# Patient Record
Sex: Female | Born: 1950
Health system: Southern US, Community
[De-identification: ages and names within clinical notes are randomized; demographics above are authoritative.]

## PROBLEM LIST (undated history)

## (undated) DIAGNOSIS — M1990 Unspecified osteoarthritis, unspecified site: Secondary | ICD-10-CM

## (undated) DIAGNOSIS — J40 Bronchitis, not specified as acute or chronic: Secondary | ICD-10-CM

## (undated) DIAGNOSIS — E785 Hyperlipidemia, unspecified: Secondary | ICD-10-CM

## (undated) DIAGNOSIS — E669 Obesity, unspecified: Secondary | ICD-10-CM

## (undated) DIAGNOSIS — M5126 Other intervertebral disc displacement, lumbar region: Secondary | ICD-10-CM

## (undated) DIAGNOSIS — M810 Age-related osteoporosis without current pathological fracture: Secondary | ICD-10-CM

## (undated) DIAGNOSIS — F329 Major depressive disorder, single episode, unspecified: Secondary | ICD-10-CM

## (undated) DIAGNOSIS — H269 Unspecified cataract: Secondary | ICD-10-CM

## (undated) DIAGNOSIS — E039 Hypothyroidism, unspecified: Secondary | ICD-10-CM

## (undated) DIAGNOSIS — K635 Polyp of colon: Secondary | ICD-10-CM

## (undated) DIAGNOSIS — J45909 Unspecified asthma, uncomplicated: Secondary | ICD-10-CM

## (undated) DIAGNOSIS — K5792 Diverticulitis of intestine, part unspecified, without perforation or abscess without bleeding: Secondary | ICD-10-CM

## (undated) DIAGNOSIS — F32A Depression, unspecified: Secondary | ICD-10-CM

## (undated) DIAGNOSIS — H409 Unspecified glaucoma: Secondary | ICD-10-CM

## (undated) DIAGNOSIS — K219 Gastro-esophageal reflux disease without esophagitis: Secondary | ICD-10-CM

## (undated) HISTORY — DX: Unspecified glaucoma: H40.9

## (undated) HISTORY — PX: BREAST SURGERY: SHX581

## (undated) HISTORY — PX: DILATION AND CURETTAGE OF UTERUS: SHX78

## (undated) HISTORY — PX: EYE SURGERY: SHX253

## (undated) HISTORY — DX: Polyp of colon: K63.5

## (undated) HISTORY — PX: OTHER SURGICAL HISTORY: SHX169

## (undated) HISTORY — DX: Hyperlipidemia, unspecified: E78.5

## (undated) HISTORY — DX: Diverticulitis of intestine, part unspecified, without perforation or abscess without bleeding: K57.92

## (undated) HISTORY — DX: Unspecified osteoarthritis, unspecified site: M19.90

## (undated) HISTORY — DX: Age-related osteoporosis without current pathological fracture: M81.0

## (undated) HISTORY — PX: COLONOSCOPY: SHX174

---

## 2005-12-01 ENCOUNTER — Ambulatory Visit: Payer: Self-pay | Admitting: Unknown Physician Specialty

## 2009-12-17 ENCOUNTER — Ambulatory Visit: Payer: Self-pay | Admitting: Unknown Physician Specialty

## 2011-06-17 ENCOUNTER — Ambulatory Visit: Payer: Self-pay | Admitting: Family Medicine

## 2013-02-21 ENCOUNTER — Emergency Department: Payer: Self-pay | Admitting: Emergency Medicine

## 2013-02-21 LAB — COMPREHENSIVE METABOLIC PANEL
Albumin: 3.6 g/dL (ref 3.4–5.0)
Alkaline Phosphatase: 90 U/L (ref 50–136)
Anion Gap: 4 — ABNORMAL LOW (ref 7–16)
BUN: 12 mg/dL (ref 7–18)
Bilirubin,Total: 0.7 mg/dL (ref 0.2–1.0)
Calcium, Total: 8.5 mg/dL (ref 8.5–10.1)
Chloride: 108 mmol/L — ABNORMAL HIGH (ref 98–107)
Co2: 27 mmol/L (ref 21–32)
EGFR (African American): >60
Glucose: 135 mg/dL — ABNORMAL HIGH (ref 65–99)
Osmolality: 279 (ref 275–301)
Potassium: 3.7 mmol/L (ref 3.5–5.1)
SGPT (ALT): 35 U/L (ref 12–78)
Sodium: 139 mmol/L (ref 136–145)

## 2013-02-21 LAB — CBC
HCT: 39.7 % (ref 35.0–47.0)
MCH: 29.4 pg (ref 26.0–34.0)
MCV: 88 fL (ref 80–100)

## 2013-02-21 LAB — TROPONIN I: Troponin-I: <0.02 ng/mL

## 2013-03-18 DIAGNOSIS — H35359 Cystoid macular degeneration, unspecified eye: Secondary | ICD-10-CM | POA: Insufficient documentation

## 2013-03-18 DIAGNOSIS — H35379 Puckering of macula, unspecified eye: Secondary | ICD-10-CM | POA: Insufficient documentation

## 2014-04-15 DIAGNOSIS — Z961 Presence of intraocular lens: Secondary | ICD-10-CM | POA: Insufficient documentation

## 2014-04-15 DIAGNOSIS — T8579XA Infection and inflammatory reaction due to other internal prosthetic devices, implants and grafts, initial encounter: Secondary | ICD-10-CM | POA: Insufficient documentation

## 2014-04-15 DIAGNOSIS — H40001 Preglaucoma, unspecified, right eye: Secondary | ICD-10-CM | POA: Insufficient documentation

## 2015-07-24 DIAGNOSIS — I8390 Asymptomatic varicose veins of unspecified lower extremity: Secondary | ICD-10-CM | POA: Insufficient documentation

## 2015-11-26 ENCOUNTER — Other Ambulatory Visit: Payer: Self-pay | Admitting: Internal Medicine

## 2015-11-26 DIAGNOSIS — R140 Abdominal distension (gaseous): Secondary | ICD-10-CM

## 2015-11-27 ENCOUNTER — Ambulatory Visit
Admission: RE | Admit: 2015-11-27 | Discharge: 2015-11-27 | Disposition: A | Discharge status: Home / Self Care | Payer: No Typology Code available for payment source | Source: Ambulatory Visit | Attending: Internal Medicine | Admitting: Internal Medicine

## 2015-11-27 ENCOUNTER — Other Ambulatory Visit: Payer: Self-pay | Admitting: Internal Medicine

## 2015-11-27 DIAGNOSIS — R05 Cough: Secondary | ICD-10-CM

## 2015-11-27 DIAGNOSIS — D259 Leiomyoma of uterus, unspecified: Secondary | ICD-10-CM | POA: Diagnosis not present

## 2015-11-27 DIAGNOSIS — R059 Cough, unspecified: Secondary | ICD-10-CM

## 2015-11-27 DIAGNOSIS — R140 Abdominal distension (gaseous): Secondary | ICD-10-CM | POA: Insufficient documentation

## 2016-09-14 ENCOUNTER — Other Ambulatory Visit: Payer: Self-pay | Admitting: Internal Medicine

## 2016-09-14 DIAGNOSIS — R140 Abdominal distension (gaseous): Secondary | ICD-10-CM

## 2016-09-19 ENCOUNTER — Other Ambulatory Visit: Payer: Self-pay | Admitting: Internal Medicine

## 2016-09-19 DIAGNOSIS — R140 Abdominal distension (gaseous): Secondary | ICD-10-CM

## 2016-09-19 DIAGNOSIS — R1030 Lower abdominal pain, unspecified: Secondary | ICD-10-CM

## 2016-09-20 ENCOUNTER — Ambulatory Visit
Admission: RE | Admit: 2016-09-20 | Discharge: 2016-09-20 | Disposition: A | Discharge status: Home / Self Care | Payer: Managed Care, Other (non HMO) | Source: Ambulatory Visit | Attending: Internal Medicine | Admitting: Internal Medicine

## 2016-09-20 DIAGNOSIS — K8020 Calculus of gallbladder without cholecystitis without obstruction: Secondary | ICD-10-CM | POA: Insufficient documentation

## 2016-09-20 DIAGNOSIS — R1900 Intra-abdominal and pelvic swelling, mass and lump, unspecified site: Secondary | ICD-10-CM | POA: Diagnosis not present

## 2016-09-20 DIAGNOSIS — R140 Abdominal distension (gaseous): Secondary | ICD-10-CM

## 2016-09-20 DIAGNOSIS — D251 Intramural leiomyoma of uterus: Secondary | ICD-10-CM | POA: Insufficient documentation

## 2016-09-20 DIAGNOSIS — R1030 Lower abdominal pain, unspecified: Secondary | ICD-10-CM

## 2016-10-10 ENCOUNTER — Encounter: Payer: Self-pay | Admitting: Gynecology

## 2016-10-10 ENCOUNTER — Telehealth: Payer: Self-pay | Admitting: Gynecology

## 2016-10-10 NOTE — Telephone Encounter (Signed)
Received a call from Dr. Etta Quill office to schedule an appt. Specifically wanted pt to be scheduled w/Dr. Aldean Ast. Appt scheduled on 12/29 at 945am.  Agreed to appt date and time.

## 2016-10-12 ENCOUNTER — Observation Stay
Admission: AD | Admit: 2016-10-12 | Discharge: 2016-10-13 | Disposition: A | Discharge status: Home / Self Care | Payer: Managed Care, Other (non HMO) | Source: Ambulatory Visit | Attending: Internal Medicine | Admitting: Internal Medicine

## 2016-10-12 ENCOUNTER — Observation Stay: Payer: Managed Care, Other (non HMO)

## 2016-10-12 ENCOUNTER — Encounter: Payer: Self-pay | Admitting: Orthopedic Surgery

## 2016-10-12 DIAGNOSIS — Z803 Family history of malignant neoplasm of breast: Secondary | ICD-10-CM | POA: Diagnosis not present

## 2016-10-12 DIAGNOSIS — F329 Major depressive disorder, single episode, unspecified: Secondary | ICD-10-CM | POA: Diagnosis not present

## 2016-10-12 DIAGNOSIS — Z9889 Other specified postprocedural states: Secondary | ICD-10-CM | POA: Diagnosis not present

## 2016-10-12 DIAGNOSIS — M7989 Other specified soft tissue disorders: Principal | ICD-10-CM | POA: Insufficient documentation

## 2016-10-12 DIAGNOSIS — J45909 Unspecified asthma, uncomplicated: Secondary | ICD-10-CM | POA: Diagnosis not present

## 2016-10-12 DIAGNOSIS — M5126 Other intervertebral disc displacement, lumbar region: Secondary | ICD-10-CM | POA: Insufficient documentation

## 2016-10-12 DIAGNOSIS — Z9841 Cataract extraction status, right eye: Secondary | ICD-10-CM | POA: Insufficient documentation

## 2016-10-12 DIAGNOSIS — M419 Scoliosis, unspecified: Secondary | ICD-10-CM | POA: Insufficient documentation

## 2016-10-12 DIAGNOSIS — Z8249 Family history of ischemic heart disease and other diseases of the circulatory system: Secondary | ICD-10-CM | POA: Diagnosis not present

## 2016-10-12 DIAGNOSIS — R609 Edema, unspecified: Secondary | ICD-10-CM

## 2016-10-12 DIAGNOSIS — R0602 Shortness of breath: Secondary | ICD-10-CM | POA: Diagnosis present

## 2016-10-12 DIAGNOSIS — R911 Solitary pulmonary nodule: Secondary | ICD-10-CM | POA: Diagnosis not present

## 2016-10-12 DIAGNOSIS — Z79899 Other long term (current) drug therapy: Secondary | ICD-10-CM | POA: Diagnosis not present

## 2016-10-12 DIAGNOSIS — R0600 Dyspnea, unspecified: Secondary | ICD-10-CM

## 2016-10-12 DIAGNOSIS — I82409 Acute embolism and thrombosis of unspecified deep veins of unspecified lower extremity: Secondary | ICD-10-CM | POA: Diagnosis present

## 2016-10-12 DIAGNOSIS — Z9842 Cataract extraction status, left eye: Secondary | ICD-10-CM | POA: Insufficient documentation

## 2016-10-12 HISTORY — DX: Other intervertebral disc displacement, lumbar region: M51.26

## 2016-10-12 HISTORY — DX: Bronchitis, not specified as acute or chronic: J40

## 2016-10-12 HISTORY — DX: Unspecified cataract: H26.9

## 2016-10-12 HISTORY — DX: Depression, unspecified: F32.A

## 2016-10-12 HISTORY — DX: Major depressive disorder, single episode, unspecified: F32.9

## 2016-10-12 LAB — APTT: aPTT: 29 seconds (ref 24–36)

## 2016-10-12 LAB — CBC
HCT: 42.3 % (ref 35.0–47.0)
Hemoglobin: 14.3 g/dL (ref 12.0–16.0)
MCH: 30.9 pg (ref 26.0–34.0)
MCHC: 33.9 g/dL (ref 32.0–36.0)
MCV: 91.1 fL (ref 80.0–100.0)
Platelets: 215 K/uL (ref 150–440)
RBC: 4.64 MIL/uL (ref 3.80–5.20)
RDW: 12.4 % (ref 11.5–14.5)
WBC: 5.9 K/uL (ref 3.6–11.0)

## 2016-10-12 LAB — COMPREHENSIVE METABOLIC PANEL WITH GFR
ALT: 24 U/L (ref 14–54)
AST: 24 U/L (ref 15–41)
Albumin: 4.1 g/dL (ref 3.5–5.0)
Alkaline Phosphatase: 68 U/L (ref 38–126)
Anion gap: 6 (ref 5–15)
BUN: 7 mg/dL (ref 6–20)
CO2: 28 mmol/L (ref 22–32)
Calcium: 9 mg/dL (ref 8.9–10.3)
Chloride: 106 mmol/L (ref 101–111)
Creatinine, Ser: 0.85 mg/dL (ref 0.44–1.00)
GFR calc Af Amer: >60 mL/min
GFR calc non Af Amer: >60 mL/min
Glucose, Bld: 111 mg/dL — ABNORMAL HIGH (ref 65–99)
Potassium: 4 mmol/L (ref 3.5–5.1)
Sodium: 140 mmol/L (ref 135–145)
Total Bilirubin: 0.7 mg/dL (ref 0.3–1.2)
Total Protein: 7.1 g/dL (ref 6.5–8.1)

## 2016-10-12 LAB — BLOOD GAS, ARTERIAL
Acid-Base Excess: 2.3 mmol/L — ABNORMAL HIGH (ref 0.0–2.0)
Bicarbonate: 27.3 mmol/L (ref 20.0–28.0)
FIO2: 0.21
O2 SAT: 89 %
PCO2 ART: 43 mmHg (ref 32.0–48.0)
Patient temperature: 37
pH, Arterial: 7.41 (ref 7.350–7.450)
pO2, Arterial: 56 mmHg — ABNORMAL LOW (ref 83.0–108.0)

## 2016-10-12 LAB — PROTIME-INR
INR: 1.03
Prothrombin Time: 13.5 seconds (ref 11.4–15.2)

## 2016-10-12 MED ORDER — IPRATROPIUM-ALBUTEROL 0.5-2.5 (3) MG/3ML IN SOLN: 3.0000 mL | Freq: Four times a day (QID) | RESPIRATORY_TRACT | Status: DC | PRN

## 2016-10-12 MED ORDER — ONDANSETRON HCL 4 MG/2ML IJ SOLN
4.0000 mg | Freq: Four times a day (QID) | INTRAMUSCULAR | Status: DC | PRN
Start: 2016-10-12 — End: 2016-10-13

## 2016-10-12 MED ORDER — APIXABAN 5 MG PO TABS
10.0000 mg | ORAL_TABLET | Freq: Two times a day (BID) | ORAL | Status: DC
  Administered 2016-10-12: 10 mg via ORAL
  Filled 2016-10-12 (×2): qty 2

## 2016-10-12 MED ORDER — ACETAMINOPHEN 650 MG RE SUPP: 650.0000 mg | Freq: Four times a day (QID) | RECTAL | Status: DC | PRN

## 2016-10-12 MED ORDER — TIOTROPIUM BROMIDE MONOHYDRATE 18 MCG IN CAPS
18.0000 ug | ORAL_CAPSULE | Freq: Every day | RESPIRATORY_TRACT | Status: DC
  Administered 2016-10-12 – 2016-10-13 (×2): 18 ug via RESPIRATORY_TRACT
  Filled 2016-10-12: qty 5

## 2016-10-12 MED ORDER — ACETAMINOPHEN 325 MG PO TABS: 650.0000 mg | ORAL_TABLET | Freq: Four times a day (QID) | ORAL | Status: DC | PRN

## 2016-10-12 MED ORDER — IOPAMIDOL (ISOVUE-370) INJECTION 76%
75.0000 mL | Freq: Once | INTRAVENOUS | Status: AC | PRN
  Administered 2016-10-12: 75 mL via INTRAVENOUS

## 2016-10-12 MED ORDER — ONDANSETRON HCL 4 MG PO TABS: 4.0000 mg | ORAL_TABLET | Freq: Four times a day (QID) | ORAL | Status: DC | PRN

## 2016-10-12 MED ORDER — IPRATROPIUM-ALBUTEROL 0.5-2.5 (3) MG/3ML IN SOLN
3.0000 mL | Freq: Four times a day (QID) | RESPIRATORY_TRACT | Status: DC | PRN
  Administered 2016-10-12: 3 mL via RESPIRATORY_TRACT
  Filled 2016-10-12: qty 3

## 2016-10-12 MED ORDER — SODIUM CHLORIDE 0.9 % IV SOLN
INTRAVENOUS | Status: AC
  Administered 2016-10-12: 14:00:00 via INTRAVENOUS

## 2016-10-12 MED ORDER — APIXABAN 5 MG PO TABS: 5.0000 mg | ORAL_TABLET | Freq: Two times a day (BID) | ORAL | Status: DC

## 2016-10-12 MED ORDER — MOMETASONE FURO-FORMOTEROL FUM 100-5 MCG/ACT IN AERO
2.0000 | INHALATION_SPRAY | Freq: Two times a day (BID) | RESPIRATORY_TRACT | Status: DC
  Administered 2016-10-12 – 2016-10-13 (×2): 2 via RESPIRATORY_TRACT
  Filled 2016-10-12: qty 8.8

## 2016-10-12 MED ORDER — METHYLPREDNISOLONE SODIUM SUCC 40 MG IJ SOLR
40.0000 mg | Freq: Four times a day (QID) | INTRAMUSCULAR | Status: DC
  Administered 2016-10-12 – 2016-10-13 (×3): 40 mg via INTRAVENOUS
  Filled 2016-10-12 (×3): qty 1

## 2016-10-12 MED ORDER — LEVOFLOXACIN 750 MG PO TABS
750.0000 mg | ORAL_TABLET | Freq: Every day | ORAL | Status: DC
  Administered 2016-10-12 – 2016-10-13 (×2): 750 mg via ORAL
  Filled 2016-10-12 (×2): qty 1

## 2016-10-12 NOTE — Progress Notes (Signed)
Verbal orders received from MD Fouke consult Heparin drip, Nasal cannula 2 L, duoneb Q6 PRN, ABG room air. Orders placed

## 2016-10-12 NOTE — Consult Note (Signed)
Pulmonary Critical Care  Initial Consult Note   Karla Turner [id number] DOB: 12-12-1950 DOA: 10/12/2016  Referring physician: Diannia Ruder, MD PCP: Lavera Guise, MD   Chief Complaint: SOB DVT  HPI: Karla Turner is a 65 y.o. female presented to the office with SOB. She had been having a cough also. Patient states that she recently travelled to [place] and also to [employer]. Patient has noted no chest pain. She has no fevers noted. She had some discomfort in the leg. She had a LW doppler done in the office and this showed POPLITEAL DVT ON THE LEFT SIDE. CT scan of the chest was ordered and this did not show a PE. She had a small nodule noted which will need to be followed. She had the cough and was treated as an outpatient with zpk. She is not on inhalers at home   Review of Systems:  Constitutional:  No weight loss, night sweats, Fevers, chills, fatigue.  HEENT:  No headaches, nasal congestion, post nasal drip,  Cardio-vascular:  No chest pain, palpitations  GI:  No heartburn, indigestion, abdominal pain, nausea, vomiting, diarrhea  Resp:  +shortness of breath. no productive cough, No coughing up of blood. +wheezing Skin:  no rash or lesions.  Musculoskeletal:  No joint pain or swelling.   Remainder ROS performed and is unremarkable other than noted in HPI  Past Medical History:  Diagnosis Date  . Bronchitis   . Cataracts, bilateral   . Depression   . Lumbar herniated disc    Past Surgical History:  Procedure Laterality Date  . cataract surgery Bilateral   . COLONOSCOPY     Social History:  reports that she has never smoked. She has never used smokeless tobacco. She reports that she does not drink alcohol. Her drug history is not on file.  No Known Allergies  Family History  Problem Relation Age of Onset  . Hypertension Mother   . Breast cancer Sister     Prior to Admission medications   Medication Sig Start Date End Date Taking? Authorizing Provider   brimonidine-timolol (COMBIGAN) 0.2-0.5 % ophthalmic solution Place 1 drop into the right eye every 12 (twelve) hours.   Yes Historical Provider, MD  buPROPion (WELLBUTRIN XL) 150 MG 24 hr tablet Take 150 mg by mouth daily.   Yes Historical Provider, MD  TIROSINT 100 MCG CAPS Take 1 capsule by mouth daily. Every morning on an empty stomach 09/16/16  Yes Historical Provider, MD   Physical Exam: Vitals:   10/12/16 1326 10/12/16 1400  BP: 109/74   Pulse: 61   Resp: 17   Temp: 98.3 F (36.8 C)   TempSrc: Oral   SpO2: 100%   Weight:  90.7 kg (200 lb)  Height:  5\' 4"  (1.626 m)    Wt Readings from Last 3 Encounters:  10/12/16 90.7 kg (200 lb)    General:  Appears calm and comfortable Eyes: PERRL, normal lids, irises & conjunctiva ENT: grossly normal hearing, lips & tongue Neck: no LAD, masses or thyromegaly Cardiovascular: RRR, no m/r/g. No LE edema. Respiratory: +rhonchi noted distant breath sounds. Normal respiratory effort. Abdomen: soft, nontender Skin: no rash or induration seen on limited exam Musculoskeletal: grossly normal tone BUE/BLE Psychiatric: grossly normal mood and affect Neurologic: grossly non-focal.          Labs on Admission:  Basic Metabolic Panel:  Recent Labs Lab 10/12/16 1342  NA 140  K 4.0  CL 106  CO2 28  GLUCOSE  111*  BUN 7  CREATININE 0.85  CALCIUM 9.0   Liver Function Tests:  Recent Labs Lab 10/12/16 1342  AST 24  ALT 24  ALKPHOS 68  BILITOT 0.7  PROT 7.1  ALBUMIN 4.1   No results for input(s): LIPASE, AMYLASE in the last 168 hours. No results for input(s): AMMONIA in the last 168 hours. CBC:  Recent Labs Lab 10/12/16 1342  WBC 5.9  HGB 14.3  HCT 42.3  MCV 91.1  PLT 215   Cardiac Enzymes: No results for input(s): CKTOTAL, CKMB, CKMBINDEX, TROPONINI in the last 168 hours.  BNP (last 3 results) No results for input(s): BNP in the last 8760 hours.  ProBNP (last 3 results) No results for input(s): PROBNP in the last  8760 hours.  CBG: No results for input(s): GLUCAP in the last 168 hours.  Radiological Exams on Admission: Ct Angio Chest Pe W Or Wo Contrast  Result Date: 10/12/2016 CLINICAL DATA:  Bilateral ultrasound. Lower extremity swelling and possible DVT. EXAM: CT ANGIOGRAPHY CHEST WITH CONTRAST TECHNIQUE: Multidetector CT imaging of the chest was performed using the standard protocol during bolus administration of intravenous contrast. Multiplanar CT image reconstructions and MIPs were obtained to evaluate the vascular anatomy. CONTRAST:  75 mL Isovue 370 COMPARISON:  Two-view chest x-ray 11/27/2015 FINDINGS: Cardiovascular: Heart size is normal. Pulmonary arterial opacification is excellent. There are no focal filling defects to suggest pulmonary emboli. The aortic arch is within normal limits. Great vessel origins are unremarkable. Mediastinum/Nodes: No significant mediastinal or axillary adenopathy is present. Lungs/Pleura: A 5 mm right middle lobe pulmonary nodule is present. No other focal nodule is present. There is no airspace disease. There is no pneumothorax or significant pleural disease. Upper Abdomen: Limited imaging of the upper abdomen is unremarkable. Musculoskeletal: Mild rightward curvature is present in the mid thoracic spine. No focal lytic or blastic lesions are present. AP alignment is anatomic. Review of the MIP images confirms the above findings. IMPRESSION: 1. No evidence for pulmonary embolus. 2. 5 mm right middle lobe pulmonary nodule. No follow-up needed if patient is low-risk. Non-contrast chest CT can be considered in 12 months if patient is high-risk. This recommendation follows the consensus statement: Guidelines for Management of Incidental Pulmonary Nodules Detected on CT Images: From the Fleischner Society 2017; Radiology 2017; 284:228-243. 3. Scoliosis. Electronically Signed   By: San Morelle M.D.   On: 10/12/2016 16:11    EKG: Independently  reviewed.  Assessment/Plan Active Problems:   DVT (deep venous thrombosis) ([place])   1. Acute DVT -she will be started on eliquis. She and I discussed different treatment options -for now eliquis will be started and will monitor -currnetly no evidence of PE  2. Obstructive asthma -no previous diagnosis but son does have asthma -will start on solumedrol and also will place on levaquin -will need outpatient PFT  3. SOB -due to above will monitor  Code Status: full code DVT Prophylaxis:on full dose anticoagulation Family Communication: son and husband Disposition Plan: home  Time spent: 39min    I have personally obtained a history, examined the patient, evaluated laboratory and imaging results, formulated the assessment and plan and placed orders.  The Patient requires high complexity decision making for assessment and support.    Allyne Gee, MD [facility] Pulmonary Critical Care Medicine Sleep Medicine

## 2016-10-12 NOTE — H&P (Signed)
[place] [facility] [place] NAME: Karla Turner    MR#:  [id number]  DATE OF BIRTH:  05/03/1951  DATE OF ADMISSION:  10/12/2016  PRIMARY CARE PHYSICIAN: Lavera Guise, MD   REQUESTING/REFERRING PHYSICIAN: Dr. Clayborn Bigness  CHIEF COMPLAINT:  No chief complaint on file.   HISTORY OF PRESENT ILLNESS:  Karla Turner  is a 65 y.o. female with a known history ofDepression, herniated disc in the back, recently treated for bronchitis presents to hospital from PCP office secondary to left leg swelling and numbness and noted to have DVT. Patient states that she has been having numbness in the superior lateral region of left knee for almost a year now. She has had long distance travels several times in the last year including a trip to [place] this summer. She denies any fevers or chills. No recent worsening of swelling of her left leg noted. She has been having dyspnea for a few months now on minimal exertion. She is not hypoxic. She was seen by her PCP and was referred to see a vascular surgeon and [facility]. They thought that she will her symptoms were secondary to varicose veins in her left leg and injected them. She came back to PCP with similar complaints and ultrasound in the office revealed a left popliteal clot today. He called for direct admission, however I advised to try oral anticoagulation as outpatient. Nurse practitioner called was worried about patient's respiratory symptoms and so she was admitted.  PAST MEDICAL HISTORY:   Past Medical History:  Diagnosis Date  . Bronchitis   . Cataracts, bilateral   . Depression   . Lumbar herniated disc     PAST SURGICAL HISTORY:   Past Surgical History:  Procedure Laterality Date  . cataract surgery Bilateral   . COLONOSCOPY      SOCIAL HISTORY:   Social History  Substance Use Topics  . Smoking status: Never Smoker  . Smokeless tobacco: Never Used  . Alcohol use No    FAMILY HISTORY:    Family History  Problem Relation Age of Onset  . Hypertension Mother   . Breast cancer Sister     DRUG ALLERGIES:  No Known Allergies  REVIEW OF SYSTEMS:   Review of Systems  Constitutional: Negative for chills, fever, malaise/fatigue and weight loss.  HENT: Negative for ear discharge, ear pain, hearing loss, nosebleeds and tinnitus.   Eyes: Negative for blurred vision, double vision and photophobia.  Respiratory: Positive for cough. Negative for hemoptysis, shortness of breath and wheezing.   Cardiovascular: Positive for leg swelling. Negative for chest pain, palpitations and orthopnea.  Gastrointestinal: Negative for abdominal pain, constipation, diarrhea, heartburn, melena, nausea and vomiting.  Genitourinary: Negative for dysuria, frequency, hematuria and urgency.  Musculoskeletal: Negative for back pain, myalgias and neck pain.  Skin: Negative for rash.  Neurological: Negative for dizziness, tingling, tremors, sensory change, speech change, focal weakness and headaches.  Endo/Heme/Allergies: Does not bruise/bleed easily.  Psychiatric/Behavioral: Negative for depression.    MEDICATIONS AT HOME:   Prior to Admission medications   Medication Sig Start Date End Date Taking? Authorizing Provider  brimonidine-timolol (COMBIGAN) 0.2-0.5 % ophthalmic solution Place 1 drop into the right eye every 12 (twelve) hours.   Yes Historical Provider, MD  buPROPion (WELLBUTRIN XL) 150 MG 24 hr tablet Take 150 mg by mouth daily.   Yes Historical Provider, MD  TIROSINT 100 MCG CAPS Take 1 capsule by mouth daily. Every morning on an  empty stomach 09/16/16  Yes Historical Provider, MD      VITAL SIGNS:  Blood pressure 109/74, pulse 61, temperature 98.3 F (36.8 C), temperature source Oral, resp. rate 17, height 5\' 4"  (1.626 m), weight 90.7 kg (200 lb), SpO2 100 %.  PHYSICAL EXAMINATION:   Physical Exam  GENERAL:  65 y.o.-year-old patient lying in the bed with no acute distress.  EYES:  Pupils equal, round, reactive to light and accommodation. No scleral icterus. Extraocular muscles intact.  HEENT: Head atraumatic, normocephalic. Oropharynx and nasopharynx clear.  NECK:  Supple, no jugular venous distention. No thyroid enlargement, no tenderness.  LUNGS: Normal breath sounds bilaterally, no  rales,rhonchi or crepitation. Scattered expiratory wheeze noted. No use of accessory muscles of respiration.  CARDIOVASCULAR: S1, S2 normal. No murmurs, rubs, or gallops.  ABDOMEN: Soft, nontender, nondistended. Bowel sounds present. No organomegaly or mass.  EXTREMITIES: No cyanosis, or clubbing. No pedal edema, swelling of the left leg noted, some numbness on superolateral part of left knee noted. NEUROLOGIC: Cranial nerves II through XII are intact. Muscle strength 5/5 in all extremities. Sensation intact. Gait not checked.  PSYCHIATRIC: The patient is alert and oriented x 3.  SKIN: No obvious rash, lesion, or ulcer.   LABORATORY PANEL:   CBC  Recent Labs Lab 10/12/16 1342  WBC 5.9  HGB 14.3  HCT 42.3  PLT 215   ------------------------------------------------------------------------------------------------------------------  Chemistries   Recent Labs Lab 10/12/16 1342  NA 140  K 4.0  CL 106  CO2 28  GLUCOSE 111*  BUN 7  CREATININE 0.85  CALCIUM 9.0  AST 24  ALT 24  ALKPHOS 68  BILITOT 0.7   ------------------------------------------------------------------------------------------------------------------  Cardiac Enzymes No results for input(s): TROPONINI in the last 168 hours. ------------------------------------------------------------------------------------------------------------------  RADIOLOGY:  No results found.  EKG:   Orders placed or performed in visit on 02/21/13  . EKG 12-Lead    IMPRESSION AND PLAN:   Karla Turner  is a 65 y.o. female with a known history of Depression, herniated disc in the back, recently treated for bronchitis  presents to hospital from PCP office secondary to left leg swelling and numbness and noted to have DVT.  #1 Left leg DVT- repeat US ordered per family's requests - CT chest to r/o PE, recent stress test as outpt done for dyspnea 3 weeks normal. - started on oral eliquis  #2 cataracts- s/p surgery, cont eye drops  #3 Recent bronchitis- duonebs prn, recently finished antibiotics  #4 DVT Prophylaxis- on eliquis   All the records are reviewed and case discussed with ED provider. Management plans discussed with the patient, family and they are in agreement.  CODE STATUS: Full Code  TOTAL TIME TAKING CARE OF THIS PATIENT: 50 minutes.    Gladstone Lighter M.D on 10/12/2016 at 3:53 PM  Between 7am to 6pm - Pager - [phone]  After 6pm go to [email or web address] - password [email or web address] [facility]  Office  [phone]  CC: Primary care physician; Lavera Guise, MD

## 2016-10-12 NOTE — Progress Notes (Signed)
ANTICOAGULATION CONSULT NOTE - Initial Consult  Pharmacy Consult for apixaban Indication: DVT  No Known Allergies  Patient Measurements: Height: 5\' 4"  (162.6 cm) Weight: 200 lb (90.7 kg) IBW/kg (Calculated) : 54.7  Vital Signs: Temp: 98.3 F (36.8 C) (12/06 1326) Temp Source: Oral (12/06 1326) BP: 109/74 (12/06 1326) Pulse Rate: 61 (12/06 1326)  Labs:  Recent Labs  10/12/16 1342  HGB 14.3  HCT 42.3  PLT 215  APTT 29  LABPROT 13.5  INR 1.03  CREATININE 0.85   Estimated Creatinine Clearance: 73.9 mL/min (by C-G formula based on SCr of 0.85 mg/dL).  Medical History: No past medical history on file.  Medications:  No prescriptions prior to admission.    Assessment: Pharmacy consulted to dose apixaban for treatment of a DVT in this 65 year old female.  Duplicate consults were entered for apixaban and heparin - clarified with attending MD who would like to continue with apixaban.  Baseline labs ordered. Patient was not taking any anticoagulants prior to admission per discussion with MD.  Goal of Therapy:  Monitor platelets by anticoagulation protocol: Yes   Plan:  Apixaban 10 mg PO BID x 7 days followed by apixaban 5 mg PO BID.  Will monitor CBC at least every 72 hours per pharmacy protocol.  Lenis Noon, PharmD, BCPS Clinical Pharmacist 10/12/2016,2:26 PM

## 2016-10-13 DIAGNOSIS — M7989 Other specified soft tissue disorders: Secondary | ICD-10-CM | POA: Diagnosis not present

## 2016-10-13 MED ORDER — PREDNISONE 10 MG PO TABS: ORAL_TABLET | ORAL | 0 refills | Status: DC

## 2016-10-13 MED ORDER — LEVOFLOXACIN 750 MG PO TABS: 750.0000 mg | ORAL_TABLET | Freq: Every day | ORAL | 0 refills | Status: AC

## 2016-10-13 MED ORDER — MOMETASONE FURO-FORMOTEROL FUM 100-5 MCG/ACT IN AERO: 2.0000 | INHALATION_SPRAY | Freq: Two times a day (BID) | RESPIRATORY_TRACT | 0 refills | Status: DC

## 2016-10-13 NOTE — Discharge Summary (Signed)
[facility] [place] NAME: Karla Turner    MR#:  [id number]  DATE OF BIRTH:  October 20, 1951  DATE OF ADMISSION:  10/12/2016 ADMITTING PHYSICIAN: Gladstone Lighter, MD  DATE OF DISCHARGE: 10/13/16  PRIMARY CARE PHYSICIAN: Lavera Guise, MD    ADMISSION DIAGNOSIS:  lt leg DVT  DISCHARGE DIAGNOSIS:  Reactive airways syndrome No DVT  SECONDARY DIAGNOSIS:   Past Medical History:  Diagnosis Date  . Bronchitis   . Cataracts, bilateral   . Depression   . Lumbar herniated disc     HOSPITAL COURSE:  Karla Turner  is a 65 y.o. female admitted 10/12/2016 with chief complaint Shortness of breath. Please see H&P performed by Gladstone Lighter, MD for further information. Patient was interestingly sent from office to hospital for acute lower extremity DVT in combination with symptoms shortness of breath. However studies revealed no DVT, underwent CAT scan negative for pulmonary embolism she was already on treatment for bronchitis and will continue this treatment  DISCHARGE CONDITIONS:   Stable  CONSULTS OBTAINED:    DRUG ALLERGIES:  No Known Allergies  DISCHARGE MEDICATIONS:   Current Discharge Medication List    START taking these medications   Details  levofloxacin (LEVAQUIN) 750 MG tablet Take 1 tablet (750 mg total) by mouth daily. Qty: 5 tablet, Refills: 0    mometasone-formoterol (DULERA) 100-5 MCG/ACT AERO Inhale 2 puffs into the lungs 2 (two) times daily. Qty: 1 Inhaler, Refills: 0    predniSONE (DELTASONE) 10 MG tablet 40mg  x 1 day, 20mg  x2day, 10mg  x2 day then stop Qty: 10 tablet, Refills: 0      CONTINUE these medications which have NOT CHANGED   Details  brimonidine-timolol (COMBIGAN) 0.2-0.5 % ophthalmic solution Place 1 drop into the right eye every 12 (twelve) hours.    buPROPion (WELLBUTRIN XL) 150 MG 24 hr tablet Take 150 mg by mouth daily.    TIROSINT 100 MCG CAPS Take 1 capsule by mouth daily. Every morning  on an empty stomach         DISCHARGE INSTRUCTIONS:    DIET:  Regular diet  DISCHARGE CONDITION:  Stable  ACTIVITY:  Activity as tolerated  OXYGEN:  Home Oxygen: No.   Oxygen Delivery: room air  DISCHARGE LOCATION:  home   If you experience worsening of your admission symptoms, develop shortness of breath, life threatening emergency, suicidal or homicidal thoughts you must seek medical attention immediately by calling 911 or calling your MD immediately  if symptoms less severe.  You Must read complete instructions/literature along with all the possible adverse reactions/side effects for all the Medicines you take and that have been prescribed to you. Take any new Medicines after you have completely understood and accpet all the possible adverse reactions/side effects.   Please note  You were cared for by a hospitalist during your hospital stay. If you have any questions about your discharge medications or the care you received while you were in the hospital after you are discharged, you can call the unit and asked to speak with the hospitalist on call if the hospitalist that took care of you is not available. Once you are discharged, your primary care physician will handle any further medical issues. Please note that NO REFILLS for any discharge medications will be authorized once you are discharged, as it is imperative that you return to your primary care physician (or establish a relationship with a primary care physician if you do not have  one) for your aftercare needs so that they can reassess your need for medications and monitor your lab values.    On the day of Discharge:   VITAL SIGNS:  Blood pressure 119/68, pulse 70, temperature 97.8 F (36.6 C), temperature source Oral, resp. rate 20, height 5\' 4"  (1.626 m), weight 90.7 kg (200 lb), SpO2 97 %.  I/O:   Intake/Output Summary (Last 24 hours) at 10/13/16 0821 Last data filed at 10/12/16 1800  Gross per 24 hour    Intake              240 ml  Output                0 ml  Net              240 ml    PHYSICAL EXAMINATION:  GENERAL:  65 y.o.-year-old patient lying in the bed with no acute distress.  EYES: Pupils equal, round, reactive to light and accommodation. No scleral icterus. Extraocular muscles intact.  HEENT: Head atraumatic, normocephalic. Oropharynx and nasopharynx clear.  NECK:  Supple, no jugular venous distention. No thyroid enlargement, no tenderness.  LUNGS: Normal breath sounds bilaterally, no wheezing, rales,rhonchi or crepitation. No use of accessory muscles of respiration.  CARDIOVASCULAR: S1, S2 normal. No murmurs, rubs, or gallops.  ABDOMEN: Soft, non-tender, non-distended. Bowel sounds present. No organomegaly or mass.  EXTREMITIES: No pedal edema, cyanosis, or clubbing.  NEUROLOGIC: Cranial nerves II through XII are intact. Muscle strength 5/5 in all extremities. Sensation intact. Gait not checked.  PSYCHIATRIC: The patient is alert and oriented x 3.  SKIN: No obvious rash, lesion, or ulcer.   DATA REVIEW:   CBC  Recent Labs Lab 10/12/16 1342  WBC 5.9  HGB 14.3  HCT 42.3  PLT 215    Chemistries   Recent Labs Lab 10/12/16 1342  NA 140  K 4.0  CL 106  CO2 28  GLUCOSE 111*  BUN 7  CREATININE 0.85  CALCIUM 9.0  AST 24  ALT 24  ALKPHOS 68  BILITOT 0.7    Cardiac Enzymes No results for input(s): TROPONINI in the last 168 hours.  Microbiology Results  No results found for this or any previous visit.  RADIOLOGY:  Ct Angio Chest Pe W Or Wo Contrast  Result Date: 10/12/2016 CLINICAL DATA:  Bilateral ultrasound. Lower extremity swelling and possible DVT. EXAM: CT ANGIOGRAPHY CHEST WITH CONTRAST TECHNIQUE: Multidetector CT imaging of the chest was performed using the standard protocol during bolus administration of intravenous contrast. Multiplanar CT image reconstructions and MIPs were obtained to evaluate the vascular anatomy. CONTRAST:  75 mL Isovue 370  COMPARISON:  Two-view chest x-ray 11/27/2015 FINDINGS: Cardiovascular: Heart size is normal. Pulmonary arterial opacification is excellent. There are no focal filling defects to suggest pulmonary emboli. The aortic arch is within normal limits. Great vessel origins are unremarkable. Mediastinum/Nodes: No significant mediastinal or axillary adenopathy is present. Lungs/Pleura: A 5 mm right middle lobe pulmonary nodule is present. No other focal nodule is present. There is no airspace disease. There is no pneumothorax or significant pleural disease. Upper Abdomen: Limited imaging of the upper abdomen is unremarkable. Musculoskeletal: Mild rightward curvature is present in the mid thoracic spine. No focal lytic or blastic lesions are present. AP alignment is anatomic. Review of the MIP images confirms the above findings. IMPRESSION: 1. No evidence for pulmonary embolus. 2. 5 mm right middle lobe pulmonary nodule. No follow-up needed if patient is low-risk. Non-contrast chest CT can be  considered in 12 months if patient is high-risk. This recommendation follows the consensus statement: Guidelines for Management of Incidental Pulmonary Nodules Detected on CT Images: From the Fleischner Society 2017; Radiology 2017; 284:228-243. 3. Scoliosis. Electronically Signed   By: San Morelle M.D.   On: 10/12/2016 16:11   US Venous Img Lower Bilateral  Result Date: 10/13/2016 CLINICAL DATA:  Lower extremity swelling. EXAM: BILATERAL LOWER EXTREMITY VENOUS DOPPLER ULTRASOUND TECHNIQUE: Gray-scale sonography with graded compression, as well as color Doppler and duplex ultrasound were performed to evaluate the lower extremity deep venous systems from the level of the common femoral vein and including the common femoral, femoral, profunda femoral, popliteal and calf veins including the posterior tibial, peroneal and gastrocnemius veins when visible. The superficial great saphenous vein was also interrogated. Spectral  Doppler was utilized to evaluate flow at rest and with distal augmentation maneuvers in the common femoral, femoral and popliteal veins. COMPARISON:  No prior. FINDINGS: RIGHT LOWER EXTREMITY Common Femoral Vein: No evidence of thrombus. Normal compressibility, respiratory phasicity and response to augmentation. Saphenofemoral Junction: No evidence of thrombus. Normal compressibility and flow on color Doppler imaging. Profunda Femoral Vein: No evidence of thrombus. Normal compressibility and flow on color Doppler imaging. Femoral Vein: No evidence of thrombus. Normal compressibility, respiratory phasicity and response to augmentation. Popliteal Vein: No evidence of thrombus. Normal compressibility, respiratory phasicity and response to augmentation. Calf Veins: No evidence of thrombus. Normal compressibility and flow on color Doppler imaging. Superficial Great Saphenous Vein: No evidence of thrombus. Normal compressibility and flow on color Doppler imaging. Other Findings:  None. LEFT LOWER EXTREMITY Common Femoral Vein: No evidence of thrombus. Normal compressibility, respiratory phasicity and response to augmentation. Saphenofemoral Junction: No evidence of thrombus. Normal compressibility and flow on color Doppler imaging. Profunda Femoral Vein: No evidence of thrombus. Normal compressibility and flow on color Doppler imaging. Femoral Vein: No evidence of thrombus. Normal compressibility, respiratory phasicity and response to augmentation. Popliteal Vein: No evidence of thrombus. Normal compressibility, respiratory phasicity and response to augmentation. Calf Veins: No evidence of thrombus. Normal compressibility and flow on color Doppler imaging. Superficial Great Saphenous Vein: No evidence of thrombus. Normal compressibility and flow on color Doppler imaging. Other Findings:  None. IMPRESSION: No evidence of deep venous thrombosis. Electronically Signed   By: Marcello Moores  Register   On: 10/13/2016 06:40      Management plans discussed with the patient, family and they are in agreement.  CODE STATUS:     Code Status Orders        Start     Ordered   10/12/16 1333  Full code  Continuous     10/12/16 1333    Code Status History    Date Active Date Inactive Code Status Order ID Comments User Context   This patient has a current code status but no historical code status.    Advance Directive Documentation   Flowsheet Row Most Recent Value  Type of Advance Directive  Healthcare Power of Attorney, Living will  Pre-existing out of facility DNR order (yellow form or pink MOST form)  No data  "MOST" Form in Place?  No data      TOTAL TIME TAKING CARE OF THIS PATIENT: 33 minutes.    Joanny Dupree,  Karenann Cai.D on 10/13/2016 at 8:21 AM  Between 7am to 6pm - Pager - [phone]  After 6pm go to [email or web address] - [facility]  Office  [phone]  CC: Primary care physician; Humphrey Rolls,  Timoteo Gaul, MD

## 2016-10-13 NOTE — Progress Notes (Signed)
Discharge instructions and prescriptions given with verbalized understanding.  IV's removed per policy and procedure.  Patient ambulated to visitors entrance in company of husband to be taken home in personal vehicle by husband.  Wheelchair offered however patient refused.

## 2016-10-17 ENCOUNTER — Encounter: Payer: Self-pay | Admitting: Gynecology

## 2016-11-02 ENCOUNTER — Telehealth: Payer: Self-pay | Admitting: Gynecology

## 2016-11-02 NOTE — Telephone Encounter (Signed)
Pt cld to reschedule appt. Pt rescheduled appt w/Dr. Aldean Ast on 12/26/16 at 945am due to her being out of town. She agreed to appt date and time. Offered her an appt w/another physician, but stated she was ok to wait unt il February.

## 2016-11-04 ENCOUNTER — Ambulatory Visit: Payer: No Typology Code available for payment source | Admitting: Gynecology

## 2016-12-26 ENCOUNTER — Encounter: Payer: Self-pay | Admitting: Gynecology

## 2016-12-26 ENCOUNTER — Ambulatory Visit: Payer: Managed Care, Other (non HMO) | Attending: Gynecology | Admitting: Gynecology

## 2016-12-26 VITALS — BP 131/72 | HR 67 | Temp 98.5°F | Resp 18 | Ht 64.0 in | Wt 205.5 lb

## 2016-12-26 DIAGNOSIS — R1900 Intra-abdominal and pelvic swelling, mass and lump, unspecified site: Secondary | ICD-10-CM | POA: Diagnosis not present

## 2016-12-26 NOTE — Progress Notes (Signed)
Consult Note: Gyn-Onc   Clarene Reamer 66 y.o. female  Chief Complaint  Patient presents with  . Pelvic Mass    Assessment :4 x 2.5 cm left pelvic mass most likely a calcified uterine fibroid. This is stable since November 2017. The patient is entirely asymptomatic.  Plan: Given the stability of this mass over the last 2 months as well as a factor the patient is asymptomatic and has a normal CA-125 would recommend she have repeat ultrasound in 3 months. We will contact the patient with those results.  The patient seeks consultation with a plastic surgeon regarding abdominoplasty and she is given the name of a local plastic surgeon for consultation.  HPI: 66 year old white female seen in consultation at the request of Dr.Saadat Humphrey Rolls regarding management of a newly diagnosed pelvic mass. 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 That time she had uterine fibroids and some menopausal symptoms all of which were managed medically.  In November 2017 she underwent a pelvic ultrasound. Is unclear to me as to the reason for this ultrasound. Nonetheless on imaging there was a 4.4 x 2.5 x 3.3 cm calcified mass adjacent to the uterus and inseparable from the uterus. She also had a smaller uterine fibroid. Follow-up ultrasound on 11/26/2016 showed the mass to be 4 x 2.5 x 2.6 cm. CA-125 value on 10/10/2016 was 10.4 units per mL.  The patient has no pelvic symptoms. Specifically she denies any pelvic pain pressure vaginal bleeding or discharge. She is not on any hormone replacement therapy. She has no other GI or GU symptoms.  Review of Systems:10 point review of systems is negative except as noted in interval history.   Vitals: Blood pressure 131/72, pulse 67, temperature 98.5 F (36.9 C), temperature source Oral, resp. rate 18, height 5\' 4"  (1.626 m), weight 205 lb 8 oz (93.2 kg), SpO2 98 %.  Physical Exam: General : The patient is a  healthy woman in no acute distress.  HEENT: normocephalic, extraoccular movements normal; neck is supple without thyromegally  Lynphnodes: Supraclavicular and inguinal nodes not enlarged  Abdomen: Soft, non-tender, no ascites, no organomegally, no masses, no hernias  Pelvic:  EGBUS: Normal female  Vagina: Normal, no lesions  Urethra and Bladder: Normal, non-tender  Cervix: Normal Uterus: Difficult to outline secondary to the patient's habitus. Cervix is mobile there is no tenderness or nodularity. Bi-manual examination: Non-tender; no adenxal masses or nodularity  Rectal: normal sphincter tone, no masses, no blood  Lower extremities: No edema or varicosities. Normal range of motion      Allergies  Allergen Reactions  . White Petrolatum     Other reaction(s): Other (See Comments) Pt states that she used Invisalign (which she states is a petroleum product and afterward developed systemic "poisoning."    Past Medical History:  Diagnosis Date  . Bronchitis   . Cataracts, bilateral   . Depression   . Lumbar herniated disc     Past Surgical History:  Procedure Laterality Date  . cataract surgery Bilateral   . COLONOSCOPY      Current Outpatient Prescriptions  Medication Sig Dispense Refill  . brimonidine-timolol (COMBIGAN) 0.2-0.5 % ophthalmic solution Place 1 drop into the right eye every 12 (twelve) hours.    Marland Kitchen buPROPion (WELLBUTRIN XL) 150 MG 24 hr tablet Take 150 mg by mouth daily.    Marland Kitchen TIROSINT 100 MCG CAPS Take 1 capsule by mouth daily. Every  morning on an empty stomach     No current facility-administered medications for this visit.     Social History   Social History  . Marital status: Married    Spouse name: N/A  . Number of children: N/A  . Years of education: N/A   Occupational History  . Not on file.   Social History Main Topics  . Smoking status: Never Smoker  . Smokeless tobacco: Never Used  . Alcohol use No  . Drug use: No  . Sexual activity: No    Other Topics Concern  . Not on file   Social History Narrative   Lives at home with family    Family History  Problem Relation Age of Onset  . Hypertension Mother   . Breast cancer Sister       Marti Sleigh, MD 12/26/2016, 10:37 AM

## 2016-12-26 NOTE — Patient Instructions (Addendum)
You will need to call [phone] to schedule your ultrasound for April. You can call anytime to schedule the ultrasound.   Plastic Surgery Referral:   Dr Sharmaine Base  [facility]: [phone]         [facility] = [phone]

## 2016-12-26 NOTE — Addendum Note (Signed)
Addended by: Marti Sleigh on: 12/26/2016 12:17 PM   Modules accepted: Orders

## 2017-01-10 ENCOUNTER — Other Ambulatory Visit: Payer: Self-pay

## 2017-01-10 DIAGNOSIS — R1900 Intra-abdominal and pelvic swelling, mass and lump, unspecified site: Secondary | ICD-10-CM | POA: Insufficient documentation

## 2017-06-21 ENCOUNTER — Other Ambulatory Visit: Payer: Self-pay | Admitting: Internal Medicine

## 2017-06-21 ENCOUNTER — Ambulatory Visit
Admission: RE | Admit: 2017-06-21 | Discharge: 2017-06-21 | Disposition: A | Discharge status: Home / Self Care | Payer: Managed Care, Other (non HMO) | Source: Ambulatory Visit | Attending: Internal Medicine | Admitting: Internal Medicine

## 2017-06-21 DIAGNOSIS — R0602 Shortness of breath: Secondary | ICD-10-CM

## 2017-06-21 DIAGNOSIS — R05 Cough: Secondary | ICD-10-CM | POA: Insufficient documentation

## 2017-06-21 DIAGNOSIS — R059 Cough, unspecified: Secondary | ICD-10-CM

## 2017-06-26 ENCOUNTER — Encounter: Payer: Self-pay | Admitting: Emergency Medicine

## 2017-06-26 ENCOUNTER — Emergency Department: Payer: Managed Care, Other (non HMO)

## 2017-06-26 ENCOUNTER — Emergency Department
Admission: EM | Admit: 2017-06-26 | Discharge: 2017-06-26 | Disposition: A | Discharge status: Home / Self Care | Payer: Managed Care, Other (non HMO) | Attending: Emergency Medicine | Admitting: Emergency Medicine

## 2017-06-26 DIAGNOSIS — Y939 Activity, unspecified: Secondary | ICD-10-CM | POA: Diagnosis not present

## 2017-06-26 DIAGNOSIS — S80211A Abrasion, right knee, initial encounter: Secondary | ICD-10-CM | POA: Diagnosis not present

## 2017-06-26 DIAGNOSIS — S99912A Unspecified injury of left ankle, initial encounter: Secondary | ICD-10-CM | POA: Diagnosis present

## 2017-06-26 DIAGNOSIS — X501XXA Overexertion from prolonged static or awkward postures, initial encounter: Secondary | ICD-10-CM | POA: Insufficient documentation

## 2017-06-26 DIAGNOSIS — S82832A Other fracture of upper and lower end of left fibula, initial encounter for closed fracture: Secondary | ICD-10-CM | POA: Diagnosis not present

## 2017-06-26 DIAGNOSIS — W19XXXA Unspecified fall, initial encounter: Secondary | ICD-10-CM | POA: Diagnosis not present

## 2017-06-26 DIAGNOSIS — Y929 Unspecified place or not applicable: Secondary | ICD-10-CM | POA: Insufficient documentation

## 2017-06-26 DIAGNOSIS — Y999 Unspecified external cause status: Secondary | ICD-10-CM | POA: Insufficient documentation

## 2017-06-26 MED ORDER — OXYCODONE-ACETAMINOPHEN 5-325 MG PO TABS: 1.0000 | ORAL_TABLET | Freq: Four times a day (QID) | ORAL | 0 refills | Status: DC | PRN

## 2017-06-26 MED ORDER — ONDANSETRON 4 MG PO TBDP: 4.0000 mg | ORAL_TABLET | Freq: Three times a day (TID) | ORAL | 0 refills | Status: DC | PRN

## 2017-06-26 MED ORDER — OXYCODONE-ACETAMINOPHEN 5-325 MG PO TABS
1.0000 | ORAL_TABLET | Freq: Once | ORAL | Status: AC
  Administered 2017-06-26: 1 via ORAL
  Filled 2017-06-26: qty 1

## 2017-06-26 MED ORDER — ONDANSETRON 4 MG PO TBDP
4.0000 mg | ORAL_TABLET | Freq: Once | ORAL | Status: AC
  Administered 2017-06-26: 4 mg via ORAL
  Filled 2017-06-26: qty 1

## 2017-06-26 NOTE — ED Notes (Signed)
Pt alert and oriented X4, active, cooperative, pt in NAD. RR even and unlabored, color WNL.  Pt informed to return if any life threatening symptoms occur.   

## 2017-06-26 NOTE — ED Triage Notes (Addendum)
Brought in via ems s/p fall  States she stepped wrong  Twisted left ankle   Positive swelling   Positive pulses   Abrasions noted to right knee  Pain and swelling to left hand Garen Lah

## 2017-06-26 NOTE — ED Provider Notes (Signed)
[facility] Emergency Department Provider Note  ___________________________________________   First MD Initiated Contact with Patient 06/26/17 1250     (approximate)  I have reviewed the triage vital signs and the nursing notes.   HISTORY  Chief Complaint Ankle Pain    HPI Karla Turner is a 66 y.o. female [place] via EMS after falling at her Vet's office. Patient states that she stepped wrong twisting her left ankle. She has been unable to bear any weight on her left foot due to pain. She also has an abrasion to her right knee which at this time is not bleeding. She has some abrasions to her left hand but is not worried about fracture. She denies any head injury or loss of consciousness. Currently she rates her pain as 6/10.   Past Medical History:  Diagnosis Date  . Bronchitis   . Cataracts, bilateral   . Depression   . Lumbar herniated disc     Patient Active Problem List   Diagnosis Date Noted  . Pelvic mass in female 01/10/2017    Past Surgical History:  Procedure Laterality Date  . cataract surgery Bilateral   . COLONOSCOPY      Prior to Admission medications   Medication Sig Start Date End Date Taking? Authorizing Provider  brimonidine-timolol (COMBIGAN) 0.2-0.5 % ophthalmic solution Place 1 drop into the right eye every 12 (twelve) hours.    [provider]  buPROPion (WELLBUTRIN XL) 150 MG 24 hr tablet Take 150 mg by mouth daily.    [provider]  ondansetron (ZOFRAN ODT) 4 MG disintegrating tablet Take 1 tablet (4 mg total) by mouth every 8 (eight) hours as needed for nausea or vomiting. 06/26/17   Johnn Hai, PA-C  oxyCODONE-acetaminophen (PERCOCET) 5-325 MG tablet Take 1 tablet by mouth every 6 (six) hours as needed for severe pain. 06/26/17   Johnn Hai, PA-C  TIROSINT 100 MCG CAPS Take 1 capsule by mouth daily. Every morning on an empty stomach 09/16/16   [provider]     Allergies White petrolatum  Family History  Problem Relation Age of Onset  . Hypertension Mother   . Breast cancer Sister     Social History Social History  Substance Use Topics  . Smoking status: Never Smoker  . Smokeless tobacco: Never Used  . Alcohol use No    Review of Systems Constitutional: No fever/chills Eyes: No visual changes. ENT:No trauma Cardiovascular: Denies chest pain. Respiratory: Denies shortness of breath. Gastrointestinal: No abdominal pain.  No nausea, no vomiting.   Musculoskeletal: Positive left ankle pain. Skin: Positive abrasion left hand. Neurological: Negative for headaches, focal weakness or numbness. __________________________________________   PHYSICAL EXAM:  VITAL SIGNS: ED Triage Vitals  Enc Vitals Group     BP 06/26/17 1216 (!) 155/68     Pulse Rate 06/26/17 1216 (!) 59     Resp 06/26/17 1216 20     Temp 06/26/17 1216 98.3 F (36.8 C)     Temp Source 06/26/17 1216 Oral     SpO2 06/26/17 1216 95 %     Weight 06/26/17 1216 200 lb (90.7 kg)     Height 06/26/17 1216 5\' 4"  (1.626 m)     Head Circumference --      Peak Flow --      Pain Score 06/26/17 1210 6     Pain Loc --      Pain Edu? --      Excl. in [place]? --  Constitutional: Alert and oriented. Well appearing and in no acute distress. Eyes: Conjunctivae are normal. PERRL. EOMI. Head: Atraumatic. Nose: No congestion/rhinnorhea. Neck: No stridor.  No cervical tenderness on palpation posteriorly. Cardiovascular: Normal rate, regular rhythm. Grossly normal heart sounds.  Good peripheral circulation. Respiratory: Normal respiratory effort.  No retractions. Lungs CTAB. Musculoskeletal: Examination of the left hand there is some minimal swelling of the left thumb and patient is able to move from without pain. On palpation there is not increased pain or suspicion of fracture. Superficial abrasions are noted. Superficial abrasion noted to the right anterior knee. On examination  of left ankle there is moderate swelling present with tenderness noted on the lateral aspect. Skin is intact. Pulses are present. Motor sensory function intact distal to the injury. Neurologic:  Normal speech and language. No gross focal neurologic deficits are appreciated. No gait instability. Skin:  Skin is warm, dry. Positive for abrasions as mentioned above. Psychiatric: Mood and affect are normal. Speech and behavior are normal.  ____________________________________________   LABS (all labs ordered are listed, but only abnormal results are displayed)  Labs Reviewed - No data to display  RADIOLOGY  Dg Ankle Complete Left  Result Date: 06/26/2017 CLINICAL DATA:  Lateral left ankle pain since a fall today. Initial encounter. EXAM: LEFT ANKLE COMPLETE - 3+ VIEW COMPARISON:  None. FINDINGS: The patient has a transverse fracture through the lateral malleolus. The fracture fragments are mildly distracted. Associated soft tissue swelling is noted. IMPRESSION: Mildly distracted transverse fracture lateral malleolus. Electronically Signed   By: Inge Rise M.D.   On: 06/26/2017 13:00    ____________________________________________   PROCEDURES  Procedure(s) performed: None  Procedures  Critical Care performed: No  ____________________________________________   INITIAL IMPRESSION / ASSESSMENT AND PLAN / ED COURSE  Pertinent labs & imaging results that were available during my care of the patient were reviewed by me and considered in my medical decision making (see chart for details).  Patient was given Percocet and Zofran while in the emergency department. Patient was placed in an OCL splint and given a walker. She is to call Dr. Ammie Ferrier office tomorrow for an appointment. In the meantime she is to ice and elevate her ankle to reduce swelling. She was made aware that her pain medication may increase drowsiness increase her risk for falling. Patient was discharged with a  prescription for Zofran if needed for nausea and Percocet.   ____________________________________________   FINAL CLINICAL IMPRESSION(S) / ED DIAGNOSES  Final diagnoses:  Other closed fracture of distal end of left fibula, initial encounter      NEW MEDICATIONS STARTED DURING THIS VISIT:  Discharge Medication List as of 06/26/2017  1:46 PM    START taking these medications   Details  ondansetron (ZOFRAN ODT) 4 MG disintegrating tablet Take 1 tablet (4 mg total) by mouth every 8 (eight) hours as needed for nausea or vomiting., Starting Mon 06/26/2017, Print    oxyCODONE-acetaminophen (PERCOCET) 5-325 MG tablet Take 1 tablet by mouth every 6 (six) hours as needed for severe pain., Starting Mon 06/26/2017, Print         Note:  This document was prepared using Dragon voice recognition software and may include unintentional dictation errors.    Johnn Hai, PA-C 06/26/17 1654    Darel Hong, MD 06/27/17 [phone]

## 2017-06-26 NOTE — Discharge Instructions (Signed)
Ice and elevate your ankle to decrease swelling. Call and make an appointment with Dr. Sabra Heck who is the orthopedist on call. Leave splint on until seen by the orthopedist. Use walker anytime you are up walking. Pain medication may make you drowsy and therefore this may increase your risk for fall.

## 2017-07-20 ENCOUNTER — Emergency Department: Payer: Managed Care, Other (non HMO)

## 2017-07-20 ENCOUNTER — Emergency Department
Admission: EM | Admit: 2017-07-20 | Discharge: 2017-07-20 | Disposition: A | Discharge status: Home / Self Care | Payer: Managed Care, Other (non HMO) | Attending: Emergency Medicine | Admitting: Emergency Medicine

## 2017-07-20 DIAGNOSIS — R1030 Lower abdominal pain, unspecified: Secondary | ICD-10-CM | POA: Diagnosis present

## 2017-07-20 DIAGNOSIS — R1900 Intra-abdominal and pelvic swelling, mass and lump, unspecified site: Secondary | ICD-10-CM | POA: Insufficient documentation

## 2017-07-20 DIAGNOSIS — Z79899 Other long term (current) drug therapy: Secondary | ICD-10-CM | POA: Diagnosis not present

## 2017-07-20 DIAGNOSIS — K5792 Diverticulitis of intestine, part unspecified, without perforation or abscess without bleeding: Secondary | ICD-10-CM | POA: Insufficient documentation

## 2017-07-20 HISTORY — DX: Unspecified asthma, uncomplicated: J45.909

## 2017-07-20 LAB — COMPREHENSIVE METABOLIC PANEL
ALBUMIN: 3.8 g/dL (ref 3.5–5.0)
ALT: 17 U/L (ref 14–54)
AST: 17 U/L (ref 15–41)
Alkaline Phosphatase: 82 U/L (ref 38–126)
Anion gap: 10 (ref 5–15)
BILIRUBIN TOTAL: 1.1 mg/dL (ref 0.3–1.2)
BUN: 10 mg/dL (ref 6–20)
CO2: 24 mmol/L (ref 22–32)
Calcium: 8.9 mg/dL (ref 8.9–10.3)
Chloride: 105 mmol/L (ref 101–111)
Creatinine, Ser: 0.71 mg/dL (ref 0.44–1.00)
GFR calc Af Amer: >60 mL/min (ref >60)
GFR calc non Af Amer: >60 mL/min (ref >60)
GLUCOSE: 109 mg/dL — AB (ref 65–99)
POTASSIUM: 4 mmol/L (ref 3.5–5.1)
SODIUM: 139 mmol/L (ref 135–145)
TOTAL PROTEIN: 7.1 g/dL (ref 6.5–8.1)

## 2017-07-20 LAB — CBC
HCT: 40.3 % (ref 35.0–47.0)
HEMOGLOBIN: 13.9 g/dL (ref 12.0–16.0)
MCH: 31.1 pg (ref 26.0–34.0)
MCHC: 34.6 g/dL (ref 32.0–36.0)
MCV: 90 fL (ref 80.0–100.0)
Platelets: 239 10*3/uL (ref 150–440)
RBC: 4.48 MIL/uL (ref 3.80–5.20)
RDW: 13 % (ref 11.5–14.5)
WBC: 10.5 10*3/uL (ref 3.6–11.0)

## 2017-07-20 LAB — URINALYSIS, COMPLETE (UACMP) WITH MICROSCOPIC
BILIRUBIN URINE: NEGATIVE
Glucose, UA: NEGATIVE mg/dL
LEUKOCYTES UA: NEGATIVE
NITRITE: NEGATIVE
PH: 6 (ref 5.0–8.0)
Protein, ur: NEGATIVE mg/dL
Specific Gravity, Urine: 1.01 (ref 1.005–1.030)
Squamous Epithelial / LPF: NONE SEEN

## 2017-07-20 LAB — LIPASE, BLOOD: Lipase: 22 U/L (ref 11–51)

## 2017-07-20 MED ORDER — IOPAMIDOL (ISOVUE-300) INJECTION 61%
30.0000 mL | Freq: Once | INTRAVENOUS | Status: AC | PRN
  Administered 2017-07-20: 30 mL via ORAL

## 2017-07-20 MED ORDER — CIPROFLOXACIN HCL 500 MG PO TABS
500.0000 mg | ORAL_TABLET | Freq: Once | ORAL | Status: AC
  Administered 2017-07-20: 500 mg via ORAL
  Filled 2017-07-20: qty 1

## 2017-07-20 MED ORDER — HYDROCODONE-ACETAMINOPHEN 5-325 MG PO TABS: 1.0000 | ORAL_TABLET | ORAL | 0 refills | Status: DC | PRN

## 2017-07-20 MED ORDER — CIPROFLOXACIN HCL 500 MG PO TABS: 500.0000 mg | ORAL_TABLET | Freq: Two times a day (BID) | ORAL | 0 refills | Status: AC

## 2017-07-20 MED ORDER — METRONIDAZOLE 500 MG PO TABS: 500.0000 mg | ORAL_TABLET | Freq: Two times a day (BID) | ORAL | 0 refills | Status: AC

## 2017-07-20 MED ORDER — METRONIDAZOLE 500 MG PO TABS
500.0000 mg | ORAL_TABLET | Freq: Once | ORAL | Status: AC
  Administered 2017-07-20: 500 mg via ORAL
  Filled 2017-07-20: qty 1

## 2017-07-20 MED ORDER — IOPAMIDOL (ISOVUE-300) INJECTION 61%
100.0000 mL | Freq: Once | INTRAVENOUS | Status: AC | PRN
  Administered 2017-07-20: 100 mL via INTRAVENOUS

## 2017-07-20 NOTE — ED Notes (Signed)
Patient transported to CT 

## 2017-07-20 NOTE — ED Notes (Signed)
Patient taken for CT

## 2017-07-20 NOTE — ED Triage Notes (Signed)
Lower abdominal cramping in "waves" that began yesterday. Reports fever at home. No medications taken PTA. Nausea.

## 2017-07-20 NOTE — ED Provider Notes (Signed)
[facility] Emergency Department Provider Note  Time seen: 8:34 AM  I have reviewed the triage vital signs and the nursing notes.   HISTORY  Chief Complaint Abdominal Pain    HPI Karla Turner is a 66 y.o. female with a past medical history of depression, presents the emergency department for lower abdominal pain. According to the patient the past 2 days she has been experiencing intermittent dull aching pain across the lower abdomen. States when it does occur it is fairly severe 8/10 in severity. States nausea but denies vomiting. Denies diarrhea, black or bloody stool, fever, dysuria or hematuria. Patient states yesterday she was measuring a low-grade fever 100-101. States the pain was coming more frequently this morning so she came to the ER for evaluation. Upon arrival the patient appears well, states minimal discomfort at this time, she is afebrile and overall very well-appearing.  Past Medical History:  Diagnosis Date  . Bronchitis   . Cataracts, bilateral   . Depression   . Lumbar herniated disc     Patient Active Problem List   Diagnosis Date Noted  . Pelvic mass in female 01/10/2017    Past Surgical History:  Procedure Laterality Date  . cataract surgery Bilateral   . COLONOSCOPY      Prior to Admission medications   Medication Sig Start Date End Date Taking? Authorizing Provider  brimonidine-timolol (COMBIGAN) 0.2-0.5 % ophthalmic solution Place 1 drop into the right eye every 12 (twelve) hours.    [provider]  buPROPion (WELLBUTRIN XL) 150 MG 24 hr tablet Take 150 mg by mouth daily.    [provider]  ondansetron (ZOFRAN ODT) 4 MG disintegrating tablet Take 1 tablet (4 mg total) by mouth every 8 (eight) hours as needed for nausea or vomiting. 06/26/17   Johnn Hai, PA-C  oxyCODONE-acetaminophen (PERCOCET) 5-325 MG tablet Take 1 tablet by mouth every 6 (six) hours as needed for severe pain. 06/26/17   Johnn Hai, PA-C  TIROSINT 100 MCG CAPS Take 1 capsule by mouth daily. Every morning on an empty stomach 09/16/16   [provider]    Allergies  Allergen Reactions  . White Petrolatum     Other reaction(s): Other (See Comments) Pt states that she used Invisalign (which she states is a petroleum product and afterward developed systemic "poisoning."    Family History  Problem Relation Age of Onset  . Hypertension Mother   . Breast cancer Sister     Social History Social History  Substance Use Topics  . Smoking status: Never Smoker  . Smokeless tobacco: Never Used  . Alcohol use No    Review of Systems Constitutional: Positive for fever at home Cardiovascular: Negative for chest pain. Respiratory: Negative for shortness of breath. Gastrointestinal: Intermittent moderate lower abdominal pain. Positive for nausea. Negative for vomiting or diarrhea Genitourinary: Negative for dysuria. Musculoskeletal: Negative for back pain. Neurological: Negative for headache All other ROS negative  ____________________________________________   PHYSICAL EXAM:  VITAL SIGNS: ED Triage Vitals [07/20/17 0808]  Enc Vitals Group     BP 99/69     Pulse Rate 87     Resp 18     Temp 98 F (36.7 C)     Temp Source Oral     SpO2 100 %     Weight 200 lb (90.7 kg)     Height 5\' 4"  (1.626 m)     Head Circumference      Peak Flow  Pain Score 0     Pain Loc      Pain Edu?      Excl. in [place]?     Constitutional: Alert and oriented. Well appearing and in no distress. Eyes: Normal exam ENT   Head: Normocephalic and atraumatic.   Mouth/Throat: Mucous membranes are moist. Cardiovascular: Normal rate, regular rhythm. No murmur Respiratory: Normal respiratory effort without tachypnea nor retractions. Breath sounds are clear  Gastrointestinal: Soft and nontender. No distention.   Musculoskeletal: Nontender with normal range of motion in all extremities.  Neurologic:  Normal  speech and language. No gross focal neurologic deficits Skin:  Skin is warm, dry and intact.  Psychiatric: Mood and affect are normal.   ____________________________________________     RADIOLOGY  CT consistent with sigmoid diverticulitis  ____________________________________________   INITIAL IMPRESSION / ASSESSMENT AND PLAN / ED COURSE  Pertinent labs & imaging results that were available during my care of the patient were reviewed by me and considered in my medical decision making (see chart for details).  Patient presents to the emergency department with intermittent lower abdominal pain. Currently has a completely nontender exam however given the patient's reported fever with severe lower abdominal pain at times will proceed with a CT the abdomen/pelvis to further evaluate. Labs are pending at this time.  CT consistent with sigmoid diverticulitis also likely pedunculated fibroid which the patient is already aware of. We will start the patient ciprofloxacin and Flagyl. We'll discharge the patient home with antibiotics, pain medication and PCP follow-up. I discussed dietary precautions as well as return precautions with the patient. She is agreeable to this plan.  ____________________________________________   FINAL CLINICAL IMPRESSION(S) / ED DIAGNOSES  Sigmoid diverticulitis    Harvest Dark, MD 07/20/17 1036

## 2017-11-02 ENCOUNTER — Ambulatory Visit (INDEPENDENT_AMBULATORY_CARE_PROVIDER_SITE_OTHER): Payer: 59

## 2017-11-02 DIAGNOSIS — D519 Vitamin B12 deficiency anemia, unspecified: Secondary | ICD-10-CM | POA: Diagnosis not present

## 2017-11-02 MED ORDER — CYANOCOBALAMIN 1000 MCG/ML IJ SOLN: 1000.0000 ug | Freq: Once | INTRAMUSCULAR | Status: DC

## 2017-11-02 MED ORDER — CYANOCOBALAMIN 1000 MCG/ML IJ SOLN
1000.0000 ug | Freq: Once | INTRAMUSCULAR | Status: AC
  Administered 2017-11-02: 1000 ug via INTRAMUSCULAR

## 2017-11-07 HISTORY — PX: BIOPSY BREAST: PRO8

## 2017-11-17 DIAGNOSIS — Z6836 Body mass index (BMI) 36.0-36.9, adult: Secondary | ICD-10-CM | POA: Insufficient documentation

## 2017-11-17 DIAGNOSIS — E669 Obesity, unspecified: Secondary | ICD-10-CM | POA: Diagnosis not present

## 2017-11-17 DIAGNOSIS — Z6834 Body mass index (BMI) 34.0-34.9, adult: Secondary | ICD-10-CM | POA: Insufficient documentation

## 2017-11-17 DIAGNOSIS — J45909 Unspecified asthma, uncomplicated: Secondary | ICD-10-CM | POA: Diagnosis not present

## 2017-11-17 DIAGNOSIS — Z86010 Personal history of colonic polyps: Secondary | ICD-10-CM | POA: Diagnosis not present

## 2017-11-17 DIAGNOSIS — K219 Gastro-esophageal reflux disease without esophagitis: Secondary | ICD-10-CM | POA: Diagnosis not present

## 2017-11-20 ENCOUNTER — Ambulatory Visit: Payer: 59 | Admitting: Internal Medicine

## 2017-11-20 ENCOUNTER — Encounter: Payer: Self-pay | Admitting: Internal Medicine

## 2017-11-20 VITALS — BP 120/80 | HR 76 | Resp 16 | Ht 64.0 in | Wt 212.0 lb

## 2017-11-20 DIAGNOSIS — J301 Allergic rhinitis due to pollen: Secondary | ICD-10-CM | POA: Diagnosis not present

## 2017-11-20 DIAGNOSIS — J4520 Mild intermittent asthma, uncomplicated: Secondary | ICD-10-CM

## 2017-11-20 DIAGNOSIS — E538 Deficiency of other specified B group vitamins: Secondary | ICD-10-CM | POA: Diagnosis not present

## 2017-11-20 DIAGNOSIS — R0602 Shortness of breath: Secondary | ICD-10-CM

## 2017-11-20 MED ORDER — CYANOCOBALAMIN 1000 MCG/ML IJ SOLN
1000.0000 ug | Freq: Once | INTRAMUSCULAR | Status: AC
  Administered 2017-11-20: 1000 ug via INTRAMUSCULAR

## 2017-11-20 MED ORDER — UMECLIDINIUM-VILANTEROL 62.5-25 MCG/INH IN AEPB: 1.0000 | INHALATION_SPRAY | Freq: Every day | RESPIRATORY_TRACT | 3 refills | Status: DC

## 2017-11-20 NOTE — Patient Instructions (Signed)

## 2017-11-20 NOTE — Progress Notes (Signed)
[facility] [place], [place] [address]  Pulmonary Sleep Medicine  Office Visit Note  Patient Name: Karla Turner DOB: [birth year] MRN [id number]  Date of Service: 11/20/2017     Complaints/HPI: She is doing well . Has history of asthma. She states that her last visit to [place] she had some issues and now has noted that she sometimes had some SOB noted. She does not really have wheeze noted. Patient had her medicine by her eye doctor. She states pressure is not down all the way. She is currently on Dulera. She has no admissions to the hospital. She is not on any other inhalers  Current Medication: Outpatient Encounter Medications as of 11/20/2017  Medication Sig  . brimonidine (ALPHAGAN P) 0.1 % SOLN 1 drop 2 (two) times daily.  Marland Kitchen buPROPion (WELLBUTRIN XL) 150 MG 24 hr tablet Take 150 mg by mouth daily.  . cycloSPORINE (RESTASIS) 0.05 % ophthalmic emulsion Place 1 drop into both eyes 2 (two) times daily.  . mometasone-formoterol (DULERA) 100-5 MCG/ACT AERO Inhale 2 puffs into the lungs 2 (two) times daily.  Marland Kitchen TIROSINT 100 MCG CAPS Take 1 capsule by mouth daily. Every morning on an empty stomach  . aspirin 325 MG EC tablet Take 325 mg by mouth daily.  . brimonidine-timolol (COMBIGAN) 0.2-0.5 % ophthalmic solution Place 1 drop into the right eye every 12 (twelve) hours.  Marland Kitchen HYDROcodone-acetaminophen (NORCO/VICODIN) 5-325 MG tablet Take 1 tablet by mouth every 4 (four) hours as needed. (Patient not taking: Reported on 11/20/2017)  . ondansetron (ZOFRAN ODT) 4 MG disintegrating tablet Take 1 tablet (4 mg total) by mouth every 8 (eight) hours as needed for nausea or vomiting. (Patient not taking: Reported on 11/20/2017)  . oxyCODONE-acetaminophen (PERCOCET) 5-325 MG tablet Take 1 tablet by mouth every 6 (six) hours as needed for severe pain. (Patient not taking: Reported on 07/20/2017)  . [EXPIRED] cyanocobalamin ((VITAMIN B-12)) injection 1,000 mcg    No  facility-administered encounter medications on file as of 11/20/2017.     Surgical History: Past Surgical History:  Procedure Laterality Date  . cataract surgery Bilateral   . COLONOSCOPY      Medical History: Past Medical History:  Diagnosis Date  . Asthma   . Bronchitis   . Cataracts, bilateral   . Depression   . Lumbar herniated disc     Family History: Family History  Problem Relation Age of Onset  . Hypertension Mother   . Breast cancer Sister     Social History: Social History   Socioeconomic History  . Marital status: Married    Spouse name: Not on file  . Number of children: Not on file  . Years of education: Not on file  . Highest education level: Not on file  Social Needs  . Financial resource strain: Not on file  . Food insecurity - worry: Not on file  . Food insecurity - inability: Not on file  . Transportation needs - medical: Not on file  . Transportation needs - non-medical: Not on file  Occupational History  . Not on file  Tobacco Use  . Smoking status: Never Smoker  . Smokeless tobacco: Never Used  Substance and Sexual Activity  . Alcohol use: No  . Drug use: No  . Sexual activity: No  Other Topics Concern  . Not on file  Social History Narrative   Lives at home with family     ROS  General: (-) fever, (-) chills, (-) night sweats, (-)  weakness, (-) changes in appetite. Skin: (-) rashes, (-) itching,. Eyes: (-) visual changes, (-) redness, (-) itching, (-) double or blurred vision. Nose and Sinuses: (-) nasal stuffiness or itchiness, (-) postnasal drip, (-) nosebleeds, (-) sinus trouble. Mouth and Throat: (-) sore throat, +hoarseness. Neck: (-) swollen glands, (-) enlarged thyroid, (-) neck pain. Respiratory: (-) cough, (-) bloody sputum, (-) shortness of breath, (-) wheezing. Cardiovascular: (-) ankle swelling, (-) chest pain. Lymphatic: (-) lymph node enlargement, (-) lymph node tenderness. Neurologic: (-) numbness, (-)  tingling,(-) dizziness. Psychiatric: (-) anxiety, (-) depression.  Vital Signs: Blood pressure 120/80, pulse 76, resp. rate 16, height 5\' 4"  (1.626 m), weight 212 lb (96.2 kg), SpO2 96 %.  Examination: General Appearance: The patient is well-developed, well-nourished, and in no distress. Skin: Gross inspection of skin demonstrates no evidence of abnormality. Head: Patient's head is normocephalic, no gross deformities. Eyes: no gross deformities noted. ENT: ears appear grossly normal. Nasopharynx appears to be normal. Neck: Supple. No thyromegaly. No LAD. Respiratory: Lungs are clear to auscultation with no adventitious sounds. Cardiovascular: Normal S1 and S2 without murmur or rub. Extremities: No cyanosis. pulses are equal. Neurologic: Alert and oriented. No involuntary movements.  LABS: No results found for this or any previous visit (from the past 2160 hour(s)).  Radiology: Ct Abdomen Pelvis W Contrast  Result Date: 07/20/2017 CLINICAL DATA:  Crampy lower abdominal pain. EXAM: CT ABDOMEN AND PELVIS WITH CONTRAST TECHNIQUE: Multidetector CT imaging of the abdomen and pelvis was performed using the standard protocol following bolus administration of intravenous contrast. CONTRAST:  132mL ISOVUE-300 IOPAMIDOL (ISOVUE-300) INJECTION 61% COMPARISON:  Abdominal and pelvic ultrasounds dated September 20, 2016. FINDINGS: Lower chest: No acute abnormality. Hepatobiliary: No focal liver abnormality. Small layering gallstones. No biliary dilatation. Pancreas: Unremarkable. No pancreatic ductal dilatation or surrounding inflammatory changes. Spleen: Normal in size without focal abnormality. Prior granulomatous disease. Adrenals/Urinary Tract: Adrenal glands are unremarkable. Kidneys are normal, without renal calculi, focal lesion, or hydronephrosis. Bladder is unremarkable. Stomach/Bowel: Short segment of sigmoid colonic wall thickening with pericolonic inflammatory changes surrounding an inflamed  diverticulum. The stomach is within normal limits. No bowel obstruction. The appendix is not definitively identified, however there are no secondary signs of inflammation in the right lower quadrant. Vascular/Lymphatic: No significant vascular findings are present. No enlarged abdominal or pelvic lymph nodes. Reproductive: The uterus and right adnexa are unremarkable. Unchanged 2.7 cm left adnexal mass containing several calcifications which appears inseparable from the uterus and left ovary, and likely represents a partially calcified pedunculated fibroid. This lesion is unchanged in size when compared to prior pelvic ultrasound from November 2017. Other: Trace free fluid in the pelvis. No pneumoperitoneum. No drainable fluid collection. Musculoskeletal: No acute or significant osseous findings. IMPRESSION: 1. Acute, uncomplicated sigmoid diverticulitis. 2. Unchanged 2.7 cm left adnexal mass containing calcifications which appears inseparable from the uterus and left ovary, and likely represents a partially calcified pedunculated fibroid. Consider further evaluation with pelvic MRI as clinically indicated. Electronically Signed   By: Titus Dubin M.D.   On: 07/20/2017 10:15    No results found.  No results found.    Assessment and Plan: Patient Active Problem List   Diagnosis Date Noted  . Pelvic mass in female 01/10/2017    1. Chronic Obstructive Asthma I am going to switch her to anoro Hold the dulera now I warned her about the use in glaucoma. She will find out if she has narrow angle glaucoma. She states it was steroid induced so this  may actucally help her  2. Allergic rhinitis Stable at this time will monitor   General Counseling: I have discussed the findings of the evaluation and examination with Lelan Pons.  I have also discussed any further diagnostic evaluation thatmay be needed or ordered today. Mirza verbalizes understanding of the findings of todays visit. We also reviewed her  medications today and discussed drug interactions and side effects including but not limited excessive drowsiness and altered mental states. We also discussed that there is always a risk not just to her but also people around her. she has been encouraged to call the office with any questions or concerns that should arise related to todays visit.    Time spent: 15  I have personally obtained a history, examined the patient, evaluated laboratory and imaging results, formulated the assessment and plan and placed orders.    Allyne Gee, MD [facility] Pulmonary and Critical Care Sleep medicine

## 2017-12-01 DIAGNOSIS — H1131 Conjunctival hemorrhage, right eye: Secondary | ICD-10-CM | POA: Diagnosis not present

## 2017-12-06 DIAGNOSIS — M5033 Other cervical disc degeneration, cervicothoracic region: Secondary | ICD-10-CM | POA: Diagnosis not present

## 2017-12-06 DIAGNOSIS — M9905 Segmental and somatic dysfunction of pelvic region: Secondary | ICD-10-CM | POA: Diagnosis not present

## 2017-12-06 DIAGNOSIS — M9901 Segmental and somatic dysfunction of cervical region: Secondary | ICD-10-CM | POA: Diagnosis not present

## 2017-12-06 DIAGNOSIS — M955 Acquired deformity of pelvis: Secondary | ICD-10-CM | POA: Diagnosis not present

## 2017-12-08 DIAGNOSIS — T8579XA Infection and inflammatory reaction due to other internal prosthetic devices, implants and grafts, initial encounter: Secondary | ICD-10-CM | POA: Diagnosis not present

## 2017-12-12 DIAGNOSIS — T8579XA Infection and inflammatory reaction due to other internal prosthetic devices, implants and grafts, initial encounter: Secondary | ICD-10-CM | POA: Diagnosis not present

## 2017-12-13 DIAGNOSIS — H11431 Conjunctival hyperemia, right eye: Secondary | ICD-10-CM | POA: Diagnosis not present

## 2017-12-13 DIAGNOSIS — M955 Acquired deformity of pelvis: Secondary | ICD-10-CM | POA: Diagnosis not present

## 2017-12-13 DIAGNOSIS — M9901 Segmental and somatic dysfunction of cervical region: Secondary | ICD-10-CM | POA: Diagnosis not present

## 2017-12-13 DIAGNOSIS — M5033 Other cervical disc degeneration, cervicothoracic region: Secondary | ICD-10-CM | POA: Diagnosis not present

## 2017-12-13 DIAGNOSIS — M9905 Segmental and somatic dysfunction of pelvic region: Secondary | ICD-10-CM | POA: Diagnosis not present

## 2017-12-18 ENCOUNTER — Ambulatory Visit: Payer: 59 | Admitting: Internal Medicine

## 2017-12-18 ENCOUNTER — Encounter: Payer: Self-pay | Admitting: Internal Medicine

## 2017-12-18 VITALS — BP 140/80 | HR 79 | Resp 16 | Ht 64.0 in | Wt 202.0 lb

## 2017-12-18 DIAGNOSIS — K635 Polyp of colon: Secondary | ICD-10-CM | POA: Insufficient documentation

## 2017-12-18 DIAGNOSIS — R0602 Shortness of breath: Secondary | ICD-10-CM | POA: Diagnosis not present

## 2017-12-18 DIAGNOSIS — K219 Gastro-esophageal reflux disease without esophagitis: Secondary | ICD-10-CM | POA: Diagnosis not present

## 2017-12-18 DIAGNOSIS — E538 Deficiency of other specified B group vitamins: Secondary | ICD-10-CM

## 2017-12-18 DIAGNOSIS — D519 Vitamin B12 deficiency anemia, unspecified: Secondary | ICD-10-CM | POA: Insufficient documentation

## 2017-12-18 DIAGNOSIS — J449 Chronic obstructive pulmonary disease, unspecified: Secondary | ICD-10-CM | POA: Insufficient documentation

## 2017-12-18 DIAGNOSIS — J4520 Mild intermittent asthma, uncomplicated: Secondary | ICD-10-CM

## 2017-12-18 MED ORDER — CYANOCOBALAMIN 1000 MCG/ML IJ SOLN
1000.0000 ug | Freq: Once | INTRAMUSCULAR | Status: AC
  Administered 2017-12-18: 1000 ug via INTRAMUSCULAR

## 2017-12-18 NOTE — Progress Notes (Signed)
[facility] [place], [place] [address]  Pulmonary Sleep Medicine  Office Visit Note  Patient Name: Karla Turner DOB: 06-05-1951 MRN [id number]  Date of Service: 12/18/2017  Complaints/HPI: She is doing well overall. Her pressures in her ey are a little elevated. She is seeing her specialist for this. He did not feel it was related to the change in her asthma medications. Patient has had good control of her asthma. She feels that it is slowly improving. She is not as good as it used to be. Not much bronchitis noted  ROS  General: (-) fever, (-) chills, (-) night sweats, (-) weakness Skin: (-) rashes, (-) itching,. Eyes: (-) visual changes, (-) redness, (-) itching. Nose and Sinuses: (-) nasal stuffiness or itchiness, (-) postnasal drip, (-) nosebleeds, (-) sinus trouble. Mouth and Throat: (-) sore throat, (-) hoarseness. Neck: (-) swollen glands, (-) enlarged thyroid, (-) neck pain. Respiratory: - cough, (-) bloody sputum, - shortness of breath, - wheezing. Cardiovascular: - ankle swelling, (-) chest pain. Lymphatic: (-) lymph node enlargement. Neurologic: (-) numbness, (-) tingling. Psychiatric: (-) anxiety, (-) depression   Current Medication: Outpatient Encounter Medications as of 12/18/2017  Medication Sig  . aspirin 325 MG EC tablet Take 325 mg by mouth daily.  . brimonidine (ALPHAGAN P) 0.1 % SOLN 1 drop 2 (two) times daily.  . brimonidine-timolol (COMBIGAN) 0.2-0.5 % ophthalmic solution Place 1 drop into the right eye every 12 (twelve) hours.  Marland Kitchen buPROPion (WELLBUTRIN XL) 150 MG 24 hr tablet Take 150 mg by mouth daily.  . cycloSPORINE (RESTASIS) 0.05 % ophthalmic emulsion Place 1 drop into both eyes 2 (two) times daily.  Marland Kitchen HYDROcodone-acetaminophen (NORCO/VICODIN) 5-325 MG tablet Take 1 tablet by mouth every 4 (four) hours as needed. (Patient not taking: Reported on 11/20/2017)  . mometasone-formoterol (DULERA) 100-5 MCG/ACT AERO Inhale 2 puffs  into the lungs 2 (two) times daily.  . ondansetron (ZOFRAN ODT) 4 MG disintegrating tablet Take 1 tablet (4 mg total) by mouth every 8 (eight) hours as needed for nausea or vomiting. (Patient not taking: Reported on 11/20/2017)  . oxyCODONE-acetaminophen (PERCOCET) 5-325 MG tablet Take 1 tablet by mouth every 6 (six) hours as needed for severe pain. (Patient not taking: Reported on 07/20/2017)  . TIROSINT 100 MCG CAPS Take 1 capsule by mouth daily. Every morning on an empty stomach  . umeclidinium-vilanterol (ANORO ELLIPTA) 62.5-25 MCG/INH AEPB Inhale 1 puff into the lungs daily.  . [EXPIRED] cyanocobalamin ((VITAMIN B-12)) injection 1,000 mcg    No facility-administered encounter medications on file as of 12/18/2017.     Surgical History: Past Surgical History:  Procedure Laterality Date  . cataract surgery Bilateral   . COLONOSCOPY      Medical History: Past Medical History:  Diagnosis Date  . Asthma   . Bronchitis   . Cataracts, bilateral   . Depression   . Lumbar herniated disc     Family History: Family History  Problem Relation Age of Onset  . Hypertension Mother   . Breast cancer Sister     Social History: Social History   Socioeconomic History  . Marital status: Married    Spouse name: Not on file  . Number of children: Not on file  . Years of education: Not on file  . Highest education level: Not on file  Social Needs  . Financial resource strain: Not on file  . Food insecurity - worry: Not on file  . Food insecurity - inability: Not on  file  . Transportation needs - medical: Not on file  . Transportation needs - non-medical: Not on file  Occupational History  . Not on file  Tobacco Use  . Smoking status: Never Smoker  . Smokeless tobacco: Never Used  Substance and Sexual Activity  . Alcohol use: No  . Drug use: No  . Sexual activity: No  Other Topics Concern  . Not on file  Social History Narrative   Lives at home with family    Vital  Signs: Blood pressure 140/80, pulse 79, resp. rate 16, height 5\' 4"  (1.626 m), weight 202 lb (91.6 kg), SpO2 95 %.  Examination: General Appearance: The patient is well-developed, well-nourished, and in no distress. Skin: Gross inspection of skin unremarkable. Head: normocephalic, no gross deformities. Eyes: no gross deformities noted. ENT: ears appear grossly normal no exudates. Neck: Supple. No thyromegaly. No LAD. Respiratory: no rhonchi noted. Cardiovascular: Normal S1 and S2 without murmur or rub. Extremities: No cyanosis. pulses are equal. Neurologic: Alert and oriented. No involuntary movements.  LABS: No results found for this or any previous visit (from the past 2160 hour(s)).  Radiology: Ct Abdomen Pelvis W Contrast  Result Date: 07/20/2017 CLINICAL DATA:  Crampy lower abdominal pain. EXAM: CT ABDOMEN AND PELVIS WITH CONTRAST TECHNIQUE: Multidetector CT imaging of the abdomen and pelvis was performed using the standard protocol following bolus administration of intravenous contrast. CONTRAST:  148mL ISOVUE-300 IOPAMIDOL (ISOVUE-300) INJECTION 61% COMPARISON:  Abdominal and pelvic ultrasounds dated September 20, 2016. FINDINGS: Lower chest: No acute abnormality. Hepatobiliary: No focal liver abnormality. Small layering gallstones. No biliary dilatation. Pancreas: Unremarkable. No pancreatic ductal dilatation or surrounding inflammatory changes. Spleen: Normal in size without focal abnormality. Prior granulomatous disease. Adrenals/Urinary Tract: Adrenal glands are unremarkable. Kidneys are normal, without renal calculi, focal lesion, or hydronephrosis. Bladder is unremarkable. Stomach/Bowel: Short segment of sigmoid colonic wall thickening with pericolonic inflammatory changes surrounding an inflamed diverticulum. The stomach is within normal limits. No bowel obstruction. The appendix is not definitively identified, however there are no secondary signs of inflammation in the right lower  quadrant. Vascular/Lymphatic: No significant vascular findings are present. No enlarged abdominal or pelvic lymph nodes. Reproductive: The uterus and right adnexa are unremarkable. Unchanged 2.7 cm left adnexal mass containing several calcifications which appears inseparable from the uterus and left ovary, and likely represents a partially calcified pedunculated fibroid. This lesion is unchanged in size when compared to prior pelvic ultrasound from November 2017. Other: Trace free fluid in the pelvis. No pneumoperitoneum. No drainable fluid collection. Musculoskeletal: No acute or significant osseous findings. IMPRESSION: 1. Acute, uncomplicated sigmoid diverticulitis. 2. Unchanged 2.7 cm left adnexal mass containing calcifications which appears inseparable from the uterus and left ovary, and likely represents a partially calcified pedunculated fibroid. Consider further evaluation with pelvic MRI as clinically indicated. Electronically Signed   By: Titus Dubin M.D.   On: 07/20/2017 10:15    No results found.  No results found.    Assessment and Plan: Patient Active Problem List   Diagnosis Date Noted  . Colon polyp 12/18/2017  . Chronic obstructive pulmonary disease (COPD) ([place]) 12/18/2017  . Shortness of breath 12/18/2017  . Vitamin B12 deficiency anemia 12/18/2017  . Asthma 11/17/2017  . GERD (gastroesophageal reflux disease) 11/17/2017  . Hx of adenomatous colonic polyps 11/17/2017  . Obesity (BMI 35.0-39.9 without comorbidity) 11/17/2017  . Pelvic mass in female 01/10/2017  . Varicose veins 07/24/2015  . Glaucoma suspect of right eye 04/15/2014  . Infection and inflammatory  reaction due to other internal prosthetic devices, implants and grafts, initial encounter ([place]) 04/15/2014  . Pseudophakia of both eyes 04/15/2014  . Cystoid macular edema 03/18/2013  . Epiretinal membrane 03/18/2013    1. Asthma she is better controlled now. Needs to monitor her response to her  medications 2. SOB clinically improved will continue to monitor PFT after she returns from [place] 3. GERD controleld will monitor  General Counseling: I have discussed the findings of the evaluation and examination with Karla Turner.  I have also discussed any further diagnostic evaluation thatmay be needed or ordered today. Karla Turner verbalizes understanding of the findings of todays visit. We also reviewed her medications today and discussed drug interactions and side effects including but not limited excessive drowsiness and altered mental states. We also discussed that there is always a risk not just to her but also people around her. she has been encouraged to call the office with any questions or concerns that should arise related to todays visit.    Time spent: 24min  I have personally obtained a history, examined the patient, evaluated laboratory and imaging results, formulated the assessment and plan and placed orders.    Allyne Gee, MD [facility] Pulmonary and Critical Care Sleep medicine

## 2017-12-18 NOTE — Patient Instructions (Signed)

## 2017-12-19 DIAGNOSIS — M955 Acquired deformity of pelvis: Secondary | ICD-10-CM | POA: Diagnosis not present

## 2017-12-19 DIAGNOSIS — M5033 Other cervical disc degeneration, cervicothoracic region: Secondary | ICD-10-CM | POA: Diagnosis not present

## 2017-12-19 DIAGNOSIS — T8579XA Infection and inflammatory reaction due to other internal prosthetic devices, implants and grafts, initial encounter: Secondary | ICD-10-CM | POA: Diagnosis not present

## 2017-12-19 DIAGNOSIS — M9905 Segmental and somatic dysfunction of pelvic region: Secondary | ICD-10-CM | POA: Diagnosis not present

## 2017-12-19 DIAGNOSIS — M9901 Segmental and somatic dysfunction of cervical region: Secondary | ICD-10-CM | POA: Diagnosis not present

## 2018-01-08 DIAGNOSIS — T8579XA Infection and inflammatory reaction due to other internal prosthetic devices, implants and grafts, initial encounter: Secondary | ICD-10-CM | POA: Diagnosis not present

## 2018-01-10 ENCOUNTER — Ambulatory Visit (INDEPENDENT_AMBULATORY_CARE_PROVIDER_SITE_OTHER): Payer: 59 | Admitting: Internal Medicine

## 2018-01-10 DIAGNOSIS — M5033 Other cervical disc degeneration, cervicothoracic region: Secondary | ICD-10-CM | POA: Diagnosis not present

## 2018-01-10 DIAGNOSIS — R0602 Shortness of breath: Secondary | ICD-10-CM

## 2018-01-10 DIAGNOSIS — M9901 Segmental and somatic dysfunction of cervical region: Secondary | ICD-10-CM | POA: Diagnosis not present

## 2018-01-10 DIAGNOSIS — M9905 Segmental and somatic dysfunction of pelvic region: Secondary | ICD-10-CM | POA: Diagnosis not present

## 2018-01-10 DIAGNOSIS — M955 Acquired deformity of pelvis: Secondary | ICD-10-CM | POA: Diagnosis not present

## 2018-01-15 DIAGNOSIS — T8579XA Infection and inflammatory reaction due to other internal prosthetic devices, implants and grafts, initial encounter: Secondary | ICD-10-CM | POA: Diagnosis not present

## 2018-01-16 DIAGNOSIS — M9905 Segmental and somatic dysfunction of pelvic region: Secondary | ICD-10-CM | POA: Diagnosis not present

## 2018-01-16 DIAGNOSIS — M9901 Segmental and somatic dysfunction of cervical region: Secondary | ICD-10-CM | POA: Diagnosis not present

## 2018-01-16 DIAGNOSIS — M955 Acquired deformity of pelvis: Secondary | ICD-10-CM | POA: Diagnosis not present

## 2018-01-16 DIAGNOSIS — M5033 Other cervical disc degeneration, cervicothoracic region: Secondary | ICD-10-CM | POA: Diagnosis not present

## 2018-01-22 NOTE — Patient Instructions (Signed)

## 2018-01-22 NOTE — Progress Notes (Signed)
[place], [address]  DATE OF SERVICE:  January 10, 2018  Complete Pulmonary Function Testing Interpretation:  FINDINGS:   forced vital capacity is myopathy decreased.  FEV1 is 1.57 L which is 67% predicted.  If 1 FVC ratio was mildly decreased.  Post bronchodilator there is no significant change in FEV1 however clinically improvement may occur and is not likely be used bronchodilators.  Total lung capacity is mildly decreased by body box and normal by nitrogen washout residual volume was decreased weans volume total lung capacity ratio is decreased FRC is decreased DLCO is within normal limits  IMPRESSION:   this study is consistent with mild obstructive lung disease.  There is no significant response to bronchodilator however clinical improvement may occur in the absence of spirometric and and does not preclude the use of bronchodilators.  DLCO is within normal limits  Allyne Gee, MD [facility] Pulmonary Critical Care Medicine Sleep Medicine

## 2018-01-26 ENCOUNTER — Encounter: Payer: Self-pay | Admitting: *Deleted

## 2018-01-29 ENCOUNTER — Encounter
Admission: RE | Disposition: A | Discharge status: Home / Self Care | Payer: Self-pay | Source: Ambulatory Visit | Attending: Unknown Physician Specialty

## 2018-01-29 ENCOUNTER — Ambulatory Visit: Payer: No Typology Code available for payment source | Admitting: Anesthesiology

## 2018-01-29 ENCOUNTER — Encounter: Payer: Self-pay | Admitting: *Deleted

## 2018-01-29 ENCOUNTER — Ambulatory Visit
Admission: RE | Admit: 2018-01-29 | Discharge: 2018-01-29 | Disposition: A | Discharge status: Home / Self Care | Payer: No Typology Code available for payment source | Source: Ambulatory Visit | Attending: Unknown Physician Specialty | Admitting: Unknown Physician Specialty

## 2018-01-29 DIAGNOSIS — Z79899 Other long term (current) drug therapy: Secondary | ICD-10-CM | POA: Diagnosis not present

## 2018-01-29 DIAGNOSIS — J449 Chronic obstructive pulmonary disease, unspecified: Secondary | ICD-10-CM | POA: Diagnosis not present

## 2018-01-29 DIAGNOSIS — Z86010 Personal history of colonic polyps: Secondary | ICD-10-CM | POA: Diagnosis not present

## 2018-01-29 DIAGNOSIS — K2900 Acute gastritis without bleeding: Secondary | ICD-10-CM | POA: Diagnosis not present

## 2018-01-29 DIAGNOSIS — K5730 Diverticulosis of large intestine without perforation or abscess without bleeding: Secondary | ICD-10-CM | POA: Diagnosis not present

## 2018-01-29 DIAGNOSIS — K635 Polyp of colon: Secondary | ICD-10-CM | POA: Diagnosis not present

## 2018-01-29 DIAGNOSIS — Z7982 Long term (current) use of aspirin: Secondary | ICD-10-CM | POA: Insufficient documentation

## 2018-01-29 DIAGNOSIS — E039 Hypothyroidism, unspecified: Secondary | ICD-10-CM | POA: Insufficient documentation

## 2018-01-29 DIAGNOSIS — K210 Gastro-esophageal reflux disease with esophagitis: Secondary | ICD-10-CM | POA: Diagnosis not present

## 2018-01-29 DIAGNOSIS — F329 Major depressive disorder, single episode, unspecified: Secondary | ICD-10-CM | POA: Insufficient documentation

## 2018-01-29 DIAGNOSIS — K219 Gastro-esophageal reflux disease without esophagitis: Secondary | ICD-10-CM | POA: Diagnosis not present

## 2018-01-29 DIAGNOSIS — K2970 Gastritis, unspecified, without bleeding: Secondary | ICD-10-CM | POA: Insufficient documentation

## 2018-01-29 DIAGNOSIS — D123 Benign neoplasm of transverse colon: Secondary | ICD-10-CM | POA: Insufficient documentation

## 2018-01-29 DIAGNOSIS — Z1211 Encounter for screening for malignant neoplasm of colon: Secondary | ICD-10-CM | POA: Insufficient documentation

## 2018-01-29 DIAGNOSIS — K640 First degree hemorrhoids: Secondary | ICD-10-CM | POA: Diagnosis not present

## 2018-01-29 DIAGNOSIS — K3189 Other diseases of stomach and duodenum: Secondary | ICD-10-CM | POA: Diagnosis not present

## 2018-01-29 HISTORY — DX: Gastro-esophageal reflux disease without esophagitis: K21.9

## 2018-01-29 HISTORY — PX: ESOPHAGOGASTRODUODENOSCOPY (EGD) WITH PROPOFOL: SHX5813

## 2018-01-29 HISTORY — PX: COLONOSCOPY WITH PROPOFOL: SHX5780

## 2018-01-29 HISTORY — DX: Hypothyroidism, unspecified: E03.9

## 2018-01-29 SURGERY — ESOPHAGOGASTRODUODENOSCOPY (EGD) WITH PROPOFOL
Anesthesia: General

## 2018-01-29 MED ORDER — PROPOFOL 500 MG/50ML IV EMUL
INTRAVENOUS | Status: DC | PRN
  Administered 2018-01-29: 75 ug/kg/min via INTRAVENOUS

## 2018-01-29 MED ORDER — LIDOCAINE HCL (PF) 2 % IJ SOLN
INTRAMUSCULAR | Status: DC | PRN
  Administered 2018-01-29: 80 mg

## 2018-01-29 MED ORDER — PROPOFOL 500 MG/50ML IV EMUL
INTRAVENOUS | Status: AC
  Filled 2018-01-29: qty 50

## 2018-01-29 MED ORDER — SODIUM CHLORIDE 0.9 % IV SOLN
INTRAVENOUS | Status: DC
  Administered 2018-01-29: 1000 mL via INTRAVENOUS

## 2018-01-29 MED ORDER — MIDAZOLAM HCL 2 MG/2ML IJ SOLN
INTRAMUSCULAR | Status: AC
  Filled 2018-01-29: qty 2

## 2018-01-29 MED ORDER — FENTANYL CITRATE (PF) 100 MCG/2ML IJ SOLN
INTRAMUSCULAR | Status: AC
  Filled 2018-01-29: qty 2

## 2018-01-29 MED ORDER — FENTANYL CITRATE (PF) 100 MCG/2ML IJ SOLN
INTRAMUSCULAR | Status: DC | PRN
  Administered 2018-01-29 (×2): 50 ug via INTRAVENOUS

## 2018-01-29 MED ORDER — LIDOCAINE HCL (PF) 2 % IJ SOLN
INTRAMUSCULAR | Status: AC
Start: 2018-01-29 — End: ?
  Filled 2018-01-29: qty 10

## 2018-01-29 MED ORDER — SODIUM CHLORIDE 0.9 % IV SOLN
INTRAVENOUS | Status: DC | PRN
  Administered 2018-01-29: 11:00:00 via INTRAVENOUS

## 2018-01-29 MED ORDER — SODIUM CHLORIDE 0.9 % IV SOLN: INTRAVENOUS | Status: DC

## 2018-01-29 MED ORDER — PROPOFOL 10 MG/ML IV BOLUS
INTRAVENOUS | Status: DC | PRN
  Administered 2018-01-29: 30 mg via INTRAVENOUS
  Administered 2018-01-29: 20 mg via INTRAVENOUS

## 2018-01-29 MED ORDER — MIDAZOLAM HCL 5 MG/5ML IJ SOLN
INTRAMUSCULAR | Status: DC | PRN
  Administered 2018-01-29: 2 mg via INTRAVENOUS

## 2018-01-29 MED ORDER — GLYCOPYRROLATE 0.2 MG/ML IJ SOLN
INTRAMUSCULAR | Status: AC
  Filled 2018-01-29: qty 1

## 2018-01-29 NOTE — Anesthesia Preprocedure Evaluation (Signed)
Anesthesia Evaluation  Patient identified by MRN, date of birth, ID band Patient awake    Reviewed: Allergy & Precautions, H&P , NPO status , reviewed documented beta blocker date and time   Airway Mallampati: II       Dental   Pulmonary shortness of breath, asthma , COPD,  Asthma stable, some seasonal nasal congestion          Cardiovascular      Neuro/Psych PSYCHIATRIC DISORDERS Depression    GI/Hepatic GERD  ,  Endo/Other  Hypothyroidism   Renal/GU      Musculoskeletal   Abdominal   Peds  Hematology  (+) anemia ,   Anesthesia Other Findings   Reproductive/Obstetrics                             Anesthesia Physical Anesthesia Plan  ASA: II  Anesthesia Plan: General   Post-op Pain Management:    Induction:   PONV Risk Score and Plan: 3 and Propofol infusion and TIVA  Airway Management Planned:   Additional Equipment:   Intra-op Plan:   Post-operative Plan:   Informed Consent: I have reviewed the patients History and Physical, chart, labs and discussed the procedure including the risks, benefits and alternatives for the proposed anesthesia with the patient or authorized representative who has indicated his/her understanding and acceptance.   Dental Advisory Given  Plan Discussed with: CRNA  Anesthesia Plan Comments:         Anesthesia Quick Evaluation

## 2018-01-29 NOTE — Anesthesia Post-op Follow-up Note (Signed)
Anesthesia QCDR form completed.        

## 2018-01-29 NOTE — Anesthesia Postprocedure Evaluation (Signed)
Anesthesia Post Note  Patient: NATTALY YEBRA  Procedure(s) Performed: ESOPHAGOGASTRODUODENOSCOPY (EGD) WITH PROPOFOL (N/A ) COLONOSCOPY WITH PROPOFOL (N/A )  Patient location during evaluation: Endoscopy Anesthesia Type: General Level of consciousness: awake and alert Pain management: pain level controlled Vital Signs Assessment: post-procedure vital signs reviewed and stable Respiratory status: spontaneous breathing, nonlabored ventilation and respiratory function stable Cardiovascular status: blood pressure returned to baseline and stable Postop Assessment: no apparent nausea or vomiting Anesthetic complications: no     Last Vitals:  Vitals:   01/29/18 1209 01/29/18 1229  BP: 102/71 108/71  Pulse: 78 77  Resp: 17 17  Temp:    SpO2: 99% 96%    Last Pain:  Vitals:   01/29/18 1149  TempSrc: Tympanic  PainSc:                  Alphonsus Sias

## 2018-01-29 NOTE — Op Note (Signed)
[facility] Gastroenterology Patient Name: Karla Turner Procedure Date: 01/29/2018 11:11 AM MRN: [id number] Account #: [id number] Date of Birth: 12-Jun-1951 Admit Type: Outpatient Age: 67 Room: [facility] ENDO ROOM 3 Gender: Female Note Status: Finalized Procedure:            Upper GI endoscopy Indications:          Heartburn, Suspected gastro-esophageal reflux disease Providers:            Manya Silvas, MD Referring MD:         Lavera Guise, MD (Referring MD) Medicines:            Propofol per Anesthesia Complications:        No immediate complications. Procedure:            Pre-Anesthesia Assessment:                       - After reviewing the risks and benefits, the patient                        was deemed in satisfactory condition to undergo the                        procedure.                       After obtaining informed consent, the endoscope was                        passed under direct vision. Throughout the procedure,                        the patient's blood pressure, pulse, and oxygen                        saturations were monitored continuously. The Endoscope                        was introduced through the mouth, and advanced to the                        second part of duodenum. The upper GI endoscopy was                        accomplished without difficulty. The patient tolerated                        the procedure well. Findings:      LA Grade A (one or more mucosal breaks less than 5 mm, not extending       between tops of 2 mucosal folds) esophagitis with no bleeding was found       40 cm from the incisors. Biopsies were taken with a cold forceps for       histology.      A single, small non-bleeding erosion was found in the gastric body.       There were no stigmata of recent bleeding. Biopsies were taken with a       cold forceps for histology. Biopsies were taken with a cold forceps for       Helicobacter pylori testing.      The  examined duodenum was normal. Impression:           -  LA Grade A reflux esophagitis. Rule out Barrett's                        esophagus. Biopsied.                       - Non-bleeding erosive gastropathy. Biopsied.                       - Normal examined duodenum. Recommendation:       - Await pathology results. Manya Silvas, MD 01/29/2018 11:27:50 AM This report has been signed electronically. Number of Addenda: 0 Note Initiated On: 01/29/2018 11:11 AM      [facility]

## 2018-01-29 NOTE — Transfer of Care (Signed)
Immediate Anesthesia Transfer of Care Note  Patient: Karla Turner  Procedure(s) Performed: ESOPHAGOGASTRODUODENOSCOPY (EGD) WITH PROPOFOL (N/A ) COLONOSCOPY WITH PROPOFOL (N/A )  Patient Location: PACU  Anesthesia Type:General  Level of Consciousness: sedated  Airway & Oxygen Therapy: Patient Spontanous Breathing and Patient connected to nasal cannula oxygen  Post-op Assessment: Report given to RN and Post -op Vital signs reviewed and stable  Post vital signs: Reviewed and stable  Last Vitals:  Vitals Value Taken Time  BP 127/88 01/29/2018 11:49 AM  Temp    Pulse 93 01/29/2018 11:50 AM  Resp 23 01/29/2018 11:50 AM  SpO2 98 % 01/29/2018 11:50 AM  Vitals shown include unvalidated device data.  Last Pain:  Vitals:   01/29/18 1033  TempSrc: Tympanic  PainSc: 0-No pain         Complications: No apparent anesthesia complications

## 2018-01-29 NOTE — H&P (Signed)
Primary Care Physician:  Lavera Guise, MD Primary Gastroenterologist:  Dr. Vira Agar  Pre-Procedure History & Physical: HPI:  Karla Turner is a 67 y.o. female is here for an endoscopy and colonoscopy.  These are for [facility] colon polyps and GERD   Past Medical History:  Diagnosis Date  . Asthma   . Bronchitis   . Cataracts, bilateral   . Depression   . GERD (gastroesophageal reflux disease)   . Hypothyroidism   . Lumbar herniated disc     Past Surgical History:  Procedure Laterality Date  . cataract surgery Bilateral   . COLONOSCOPY    . mamoplasty Bilateral     Prior to Admission medications   Medication Sig Start Date End Date Taking? Authorizing Provider  albuterol (PROVENTIL,VENTOLIN) 2 MG/5ML syrup Take 2 mg by mouth 3 (three) times daily.   Yes [provider]  Brinzolamide-Brimonidine (SIMBRINZA) 1-0.2 % SUSP Apply to eye.   Yes [provider]  umeclidinium-vilanterol (ANORO ELLIPTA) 62.5-25 MCG/INH AEPB Inhale 1 puff into the lungs daily. 11/20/17  Yes Allyne Gee, MD  aspirin 325 MG EC tablet Take 325 mg by mouth daily.    [provider]  cycloSPORINE (RESTASIS) 0.05 % ophthalmic emulsion Place 1 drop into both eyes 2 (two) times daily.    [provider]  TIROSINT 100 MCG CAPS Take 1 capsule by mouth daily. Every morning on an empty stomach 09/16/16   [provider]    Allergies as of 12/11/2017 - Review Complete 11/20/2017  Allergen Reaction Noted  . White petrolatum  07/24/2015    Family History  Problem Relation Age of Onset  . Hypertension Mother   . Breast cancer Sister     Social History   Socioeconomic History  . Marital status: Married    Spouse name: Not on file  . Number of children: Not on file  . Years of education: Not on file  . Highest education level: Not on file  Occupational History  . Not on file  Social Needs  . Financial resource strain: Not on file  . Food insecurity:    Worry:  Not on file    Inability: Not on file  . Transportation needs:    Medical: Not on file    Non-medical: Not on file  Tobacco Use  . Smoking status: Never Smoker  . Smokeless tobacco: Never Used  Substance and Sexual Activity  . Alcohol use: No  . Drug use: No  . Sexual activity: Never  Lifestyle  . Physical activity:    Days per week: Not on file    Minutes per session: Not on file  . Stress: Not on file  Relationships  . Social connections:    Talks on phone: Not on file    Gets together: Not on file    Attends religious service: Not on file    Active member of club or organization: Not on file    Attends meetings of clubs or organizations: Not on file    Relationship status: Not on file  . Intimate partner violence:    Fear of current or ex partner: Not on file    Emotionally abused: Not on file    Physically abused: Not on file    Forced sexual activity: Not on file  Other Topics Concern  . Not on file  Social History Narrative   Lives at home with family    Review of Systems: See HPI, otherwise negative ROS  Physical Exam: BP 125/77   Pulse 96   Temp (!) 96.6 F (35.9 C) (Tympanic)   Resp 16   Ht 5\' 4"  (1.626 m)   Wt 90.7 kg (200 lb)   SpO2 98%   BMI 34.33 kg/m  General:   Alert,  pleasant and cooperative in NAD Head:  Normocephalic and atraumatic. Neck:  Supple; no masses or thyromegaly. Lungs:  Clear throughout to auscultation.    Heart:  Regular rate and rhythm. Abdomen:  Soft, nontender and nondistended. Normal bowel sounds, without guarding, and without rebound.   Neurologic:  Alert and  oriented x4;  grossly normal neurologically.  Impression/Plan: Karla Turner is here for an endoscopy and colonoscopy to be performed for [facility] colon polyps and GERD.  Risks, benefits, limitations, and alternatives regarding  endoscopy and colonoscopy have been reviewed with the patient.  Questions have been answered.  All parties agreeable.   Gaylyn Cheers, MD   01/29/2018, 11:10 AM

## 2018-01-29 NOTE — Op Note (Signed)
[facility] Gastroenterology Patient Name: Karla Turner Procedure Date: 01/29/2018 11:10 AM MRN: [id number] Account #: [id number] Date of Birth: 1950/12/19 Admit Type: Outpatient Age: 67 Room: [facility] ENDO ROOM 3 Gender: Female Note Status: Finalized Procedure:            Colonoscopy Indications:          High risk colon cancer surveillance: Personal history                        of colonic polyps Providers:            Manya Silvas, MD Referring MD:         Lavera Guise, MD (Referring MD) Medicines:            Propofol per Anesthesia Complications:        No immediate complications. Procedure:            Pre-Anesthesia Assessment:                       - After reviewing the risks and benefits, the patient                        was deemed in satisfactory condition to undergo the                        procedure.                       After obtaining informed consent, the colonoscope was                        passed under direct vision. Throughout the procedure,                        the patient's blood pressure, pulse, and oxygen                        saturations were monitored continuously. The                        Colonoscope was introduced through the anus and                        advanced to the the cecum, identified by appendiceal                        orifice and ileocecal valve. The colonoscopy was                        performed without difficulty. The patient tolerated the                        procedure well. The quality of the bowel preparation                        was good. Findings:      A small polyp was found in the transverse colon. The polyp was sessile.       The polyp was removed with a hot snare. Resection and retrieval were       complete.      A few small-mouthed diverticula were  found in the sigmoid colon,       descending colon and transverse colon.      A diminutive polyp was found in the proximal ascending colon. The  polyp       was sessile. The polyp was removed with a cold snare. Resection and       retrieval were complete.      Internal hemorrhoids were found during endoscopy. The hemorrhoids were       medium-sized and Grade I (internal hemorrhoids that do not prolapse).      The exam was otherwise without abnormality. Impression:           - One small polyp in the transverse colon, removed with                        a hot snare. Resected and retrieved.                       - Diverticulosis in the sigmoid colon, in the                        descending colon and in the transverse colon.                       - One diminutive polyp in the proximal ascending colon,                        removed with a cold snare. Resected and retrieved.                       - Internal hemorrhoids.                       - The examination was otherwise normal. Recommendation:       - Await pathology results. Manya Silvas, MD 01/29/2018 11:48:36 AM This report has been signed electronically. Number of Addenda: 0 Note Initiated On: 01/29/2018 11:10 AM Scope Withdrawal Time: 0 hours 7 minutes 54 seconds  Total Procedure Duration: 0 hours 14 minutes 48 seconds       [facility]

## 2018-01-30 ENCOUNTER — Encounter: Payer: Self-pay | Admitting: Unknown Physician Specialty

## 2018-01-31 ENCOUNTER — Encounter: Payer: Self-pay | Admitting: Internal Medicine

## 2018-01-31 ENCOUNTER — Ambulatory Visit (INDEPENDENT_AMBULATORY_CARE_PROVIDER_SITE_OTHER): Payer: 59 | Admitting: Internal Medicine

## 2018-01-31 VITALS — BP 127/73 | HR 69 | Resp 16 | Ht 64.0 in | Wt 212.2 lb

## 2018-01-31 DIAGNOSIS — E538 Deficiency of other specified B group vitamins: Secondary | ICD-10-CM | POA: Diagnosis not present

## 2018-01-31 DIAGNOSIS — E039 Hypothyroidism, unspecified: Secondary | ICD-10-CM

## 2018-01-31 DIAGNOSIS — R300 Dysuria: Secondary | ICD-10-CM | POA: Diagnosis not present

## 2018-01-31 DIAGNOSIS — R1084 Generalized abdominal pain: Secondary | ICD-10-CM | POA: Diagnosis not present

## 2018-01-31 DIAGNOSIS — Z1231 Encounter for screening mammogram for malignant neoplasm of breast: Secondary | ICD-10-CM

## 2018-01-31 DIAGNOSIS — Z0001 Encounter for general adult medical examination with abnormal findings: Secondary | ICD-10-CM

## 2018-01-31 DIAGNOSIS — R102 Pelvic and perineal pain: Secondary | ICD-10-CM | POA: Diagnosis not present

## 2018-01-31 DIAGNOSIS — Z1239 Encounter for other screening for malignant neoplasm of breast: Secondary | ICD-10-CM

## 2018-01-31 LAB — SURGICAL PATHOLOGY

## 2018-01-31 MED ORDER — CYANOCOBALAMIN 1000 MCG/ML IJ SOLN
1000.0000 ug | Freq: Once | INTRAMUSCULAR | Status: AC
  Administered 2018-01-31: 1000 ug via INTRAMUSCULAR

## 2018-01-31 NOTE — Progress Notes (Signed)
Karla Turner, Karla Turner  Internal MEDICINE  Office Visit Note  Patient Name: Karla Turner  [id number]  [id number]  Date of Service: 02/12/2018  Chief Complaint  Patient presents with  . Annual Exam  . Asthma  . Gastroesophageal Reflux    Asthma  She complains of chest tightness. There is no cough or shortness of breath. This is a recurrent problem. The current episode started more than 1 year ago. The problem occurs intermittently. The problem has been resolved. Associated symptoms include heartburn and postnasal drip. Pertinent negatives include no chest pain, rhinorrhea, sneezing or sore throat. Her past medical history is significant for asthma.  Gastroesophageal Reflux  She complains of heartburn. She reports no abdominal pain, no chest pain, no coughing, no nausea or no sore throat. This is a chronic problem. The symptoms are aggravated by certain foods. Pertinent negatives include no fatigue. Risk factors include obesity and lack of exercise. She has tried a PPI for the symptoms.  Cough  Associated symptoms include heartburn and postnasal drip. Pertinent negatives include no chest pain, chills, eye redness, rash, rhinorrhea, sore throat or shortness of breath. Her past medical history is significant for asthma.   Pt is here for routine health maintenance examination. C/O abdominal pain. And pelvic pain.She had a cyst and will like to have a follow up U/S   Current Medication: Outpatient Encounter Medications as of 01/31/2018  Medication Sig  . omeprazole (PRILOSEC OTC) 20 MG tablet Take by mouth.  Marland Kitchen albuterol (PROVENTIL,VENTOLIN) 2 MG/5ML syrup Take 2 mg by mouth 3 (three) times daily.  Marland Kitchen aspirin 325 MG EC tablet Take 325 mg by mouth daily.  . Brinzolamide-Brimonidine (SIMBRINZA) 1-0.2 % SUSP Apply to eye.  . cycloSPORINE (RESTASIS) 0.05 % ophthalmic emulsion Place 1 drop into both eyes 2 (two) times daily.  Marland Kitchen TIROSINT 100 MCG CAPS  Take 1 capsule by mouth daily. Every morning on an empty stomach  . umeclidinium-vilanterol (ANORO ELLIPTA) 62.5-25 MCG/INH AEPB Inhale 1 puff into the lungs daily.  . [EXPIRED] cyanocobalamin ((VITAMIN B-12)) injection 1,000 mcg    No facility-administered encounter medications on file as of 01/31/2018.     Surgical History: Past Surgical History:  Procedure Laterality Date  . cataract surgery Bilateral   . COLONOSCOPY    . COLONOSCOPY WITH PROPOFOL N/A 01/29/2018   Procedure: COLONOSCOPY WITH PROPOFOL;  Surgeon: Manya Silvas, MD;  Location: [facility] ENDOSCOPY;  Service: Endoscopy;  Laterality: N/A;  . ESOPHAGOGASTRODUODENOSCOPY (EGD) WITH PROPOFOL N/A 01/29/2018   Procedure: ESOPHAGOGASTRODUODENOSCOPY (EGD) WITH PROPOFOL;  Surgeon: Manya Silvas, MD;  Location: [facility] ENDOSCOPY;  Service: Endoscopy;  Laterality: N/A;  . mamoplasty Bilateral     Medical History: Past Medical History:  Diagnosis Date  . Asthma   . Bronchitis   . Cataracts, bilateral   . Depression   . GERD (gastroesophageal reflux disease)   . Glaucoma   . Hypothyroidism   . Lumbar herniated disc     Family History: Family History  Problem Relation Age of Onset  . Hypertension Mother   . Breast cancer Sister    Review of Systems  Constitutional: Negative for chills, fatigue and unexpected weight change.  HENT: Positive for postnasal drip. Negative for congestion, rhinorrhea, sneezing and sore throat.   Eyes: Negative for redness.  Respiratory: Negative for cough, chest tightness and shortness of breath.   Cardiovascular: Negative for chest pain and palpitations.  Gastrointestinal: Positive for heartburn. Negative for abdominal pain, constipation,  diarrhea, nausea and vomiting.  Genitourinary: Negative for dysuria and frequency.  Musculoskeletal: Negative for arthralgias, back pain, joint swelling and neck pain.  Skin: Negative for rash.  Neurological: Negative.  Negative for tremors and numbness.   Hematological: Negative for adenopathy. Does not bruise/bleed easily.  Psychiatric/Behavioral: Negative for behavioral problems (Depression), sleep disturbance and suicidal ideas. The patient is not nervous/anxious.     Vital Signs: BP 127/73 (BP Location: Right Arm, Patient Position: Sitting, Cuff Size: Normal)   Pulse 69   Resp 16   Ht 5\' 4"  (1.626 m)   Wt 212 lb 3.2 oz (96.3 kg)   SpO2 96%   BMI 36.42 kg/m    Physical Exam  Constitutional: She is oriented to person, place, and time. She appears well-developed and well-nourished. No distress.  HENT:  Head: Normocephalic and atraumatic.  Mouth/Throat: Oropharynx is clear and moist. No oropharyngeal exudate.  Eyes: Pupils are equal, round, and reactive to light. EOM are normal.  Neck: Normal range of motion. Neck supple. No JVD present. No tracheal deviation present. No thyromegaly present.  Cardiovascular: Normal rate, regular rhythm and normal heart sounds. Exam reveals no gallop and no friction rub.  No murmur heard. Pulmonary/Chest: Effort normal. No respiratory distress. She has no wheezes. She has no rales. She exhibits no tenderness. Right breast exhibits no mass and no nipple discharge. Left breast exhibits no mass and no nipple discharge. Breasts are symmetrical.  Abdominal: Soft. Bowel sounds are normal.  Musculoskeletal: Normal range of motion.  Lymphadenopathy:    She has no cervical adenopathy.  Neurological: She is alert and oriented to person, place, and time. No cranial nerve deficit.  Skin: Skin is warm and dry. She is not diaphoretic.  Psychiatric: She has a normal mood and affect. Her behavior is normal. Judgment and thought content normal.   Assessment/Plan: 1. Encounter for general adult medical examination with abnormal findings - Up to date on PHM  2. Hypothyroidism, unspecified type - TSH + free T4  3. Vitamin B12 deficiency - cyanocobalamin ((VITAMIN B-12)) injection 1,000 mcg  4. Generalized  abdominal cramping - US Abdomen Complete; Future - Hepatitis c vrs RNA detect by PCR-qual  5. Pelvic pain - US PELVIS (TRANSABDOMINAL ONLY); Future  6. Breast screening - MM DIGITAL SCREENING BILATERAL; Future  7. Dysuria - Urinalysis, Routine w reflex microscopic  General Counseling: Karla Turner understanding of the findings of todays visit and agrees with plan of treatment. I have discussed any further diagnostic evaluation that may be needed or ordered today. We also reviewed her medications today. she has been encouraged to call the office with any questions or concerns that should arise related to todays visit.   Orders Placed This Encounter  Procedures  . MM DIGITAL SCREENING BILATERAL  . US PELVIS (TRANSABDOMINAL ONLY)  . US Abdomen Complete  . Urinalysis, Routine w reflex microscopic  . TSH + free T4  . Hepatitis c vrs RNA detect by PCR-qual    Meds ordered this encounter  Medications  . cyanocobalamin ((VITAMIN B-12)) injection 1,000 mcg    Time spent:25 Minutes    Lavera Guise, MD  Internal Medicine

## 2018-02-01 DIAGNOSIS — M9901 Segmental and somatic dysfunction of cervical region: Secondary | ICD-10-CM | POA: Diagnosis not present

## 2018-02-01 DIAGNOSIS — M9905 Segmental and somatic dysfunction of pelvic region: Secondary | ICD-10-CM | POA: Diagnosis not present

## 2018-02-01 DIAGNOSIS — M955 Acquired deformity of pelvis: Secondary | ICD-10-CM | POA: Diagnosis not present

## 2018-02-01 DIAGNOSIS — M5033 Other cervical disc degeneration, cervicothoracic region: Secondary | ICD-10-CM | POA: Diagnosis not present

## 2018-02-01 LAB — URINALYSIS, ROUTINE W REFLEX MICROSCOPIC
BILIRUBIN UA: NEGATIVE
Glucose, UA: NEGATIVE
Leukocytes, UA: NEGATIVE
NITRITE UA: NEGATIVE
PH UA: 5.5 (ref 5.0–7.5)
Protein, UA: NEGATIVE
RBC UA: NEGATIVE
SPEC GRAV UA: 1.026 (ref 1.005–1.030)
Urobilinogen, Ur: 0.2 mg/dL (ref 0.2–1.0)

## 2018-02-07 DIAGNOSIS — M9901 Segmental and somatic dysfunction of cervical region: Secondary | ICD-10-CM | POA: Diagnosis not present

## 2018-02-07 DIAGNOSIS — M955 Acquired deformity of pelvis: Secondary | ICD-10-CM | POA: Diagnosis not present

## 2018-02-07 DIAGNOSIS — M5033 Other cervical disc degeneration, cervicothoracic region: Secondary | ICD-10-CM | POA: Diagnosis not present

## 2018-02-07 DIAGNOSIS — M9905 Segmental and somatic dysfunction of pelvic region: Secondary | ICD-10-CM | POA: Diagnosis not present

## 2018-02-13 DIAGNOSIS — E039 Hypothyroidism, unspecified: Secondary | ICD-10-CM | POA: Diagnosis not present

## 2018-02-13 DIAGNOSIS — H04129 Dry eye syndrome of unspecified lacrimal gland: Secondary | ICD-10-CM | POA: Diagnosis not present

## 2018-02-13 DIAGNOSIS — R69 Illness, unspecified: Secondary | ICD-10-CM | POA: Diagnosis not present

## 2018-02-13 DIAGNOSIS — Z809 Family history of malignant neoplasm, unspecified: Secondary | ICD-10-CM | POA: Diagnosis not present

## 2018-02-13 DIAGNOSIS — K219 Gastro-esophageal reflux disease without esophagitis: Secondary | ICD-10-CM | POA: Diagnosis not present

## 2018-02-13 DIAGNOSIS — Z803 Family history of malignant neoplasm of breast: Secondary | ICD-10-CM | POA: Diagnosis not present

## 2018-02-13 DIAGNOSIS — H409 Unspecified glaucoma: Secondary | ICD-10-CM | POA: Diagnosis not present

## 2018-02-13 DIAGNOSIS — E669 Obesity, unspecified: Secondary | ICD-10-CM | POA: Diagnosis not present

## 2018-02-13 DIAGNOSIS — J45909 Unspecified asthma, uncomplicated: Secondary | ICD-10-CM | POA: Diagnosis not present

## 2018-02-14 DIAGNOSIS — M955 Acquired deformity of pelvis: Secondary | ICD-10-CM | POA: Diagnosis not present

## 2018-02-14 DIAGNOSIS — M5033 Other cervical disc degeneration, cervicothoracic region: Secondary | ICD-10-CM | POA: Diagnosis not present

## 2018-02-14 DIAGNOSIS — M9905 Segmental and somatic dysfunction of pelvic region: Secondary | ICD-10-CM | POA: Diagnosis not present

## 2018-02-14 DIAGNOSIS — M9901 Segmental and somatic dysfunction of cervical region: Secondary | ICD-10-CM | POA: Diagnosis not present

## 2018-02-15 ENCOUNTER — Other Ambulatory Visit: Payer: Self-pay | Admitting: Internal Medicine

## 2018-02-15 DIAGNOSIS — R0602 Shortness of breath: Secondary | ICD-10-CM

## 2018-02-21 DIAGNOSIS — M9901 Segmental and somatic dysfunction of cervical region: Secondary | ICD-10-CM | POA: Diagnosis not present

## 2018-02-21 DIAGNOSIS — M5033 Other cervical disc degeneration, cervicothoracic region: Secondary | ICD-10-CM | POA: Diagnosis not present

## 2018-02-21 DIAGNOSIS — M955 Acquired deformity of pelvis: Secondary | ICD-10-CM | POA: Diagnosis not present

## 2018-02-21 DIAGNOSIS — M9905 Segmental and somatic dysfunction of pelvic region: Secondary | ICD-10-CM | POA: Diagnosis not present

## 2018-03-07 DIAGNOSIS — M5033 Other cervical disc degeneration, cervicothoracic region: Secondary | ICD-10-CM | POA: Diagnosis not present

## 2018-03-07 DIAGNOSIS — M955 Acquired deformity of pelvis: Secondary | ICD-10-CM | POA: Diagnosis not present

## 2018-03-07 DIAGNOSIS — M9901 Segmental and somatic dysfunction of cervical region: Secondary | ICD-10-CM | POA: Diagnosis not present

## 2018-03-07 DIAGNOSIS — M9905 Segmental and somatic dysfunction of pelvic region: Secondary | ICD-10-CM | POA: Diagnosis not present

## 2018-03-09 ENCOUNTER — Telehealth: Payer: Self-pay | Admitting: Internal Medicine

## 2018-03-09 LAB — HEPATITIS C VRS RNA DETECT BY PCR-QUAL: HCV RNA NAA QUALITATIVE: NEGATIVE

## 2018-03-09 LAB — TSH+FREE T4
Free T4: 1.93 ng/dL — ABNORMAL HIGH (ref 0.82–1.77)
TSH: 0.428 u[IU]/mL — ABNORMAL LOW (ref 0.450–4.500)

## 2018-03-09 NOTE — Progress Notes (Signed)
Pt has elevated levels of thyroid, she needs to stop Tirosent for 2 days and then restart as before but only take 6 days out of the week, she can miss one day whichever day she likes. Will recheck levels in 6 weeks, tell her not refill her rx as her dose might need reduction

## 2018-03-09 NOTE — Telephone Encounter (Signed)
-----   Message from Lavera Guise, MD sent at 03/09/2018 10:05 AM EDT ----- Pt has elevated levels of thyroid, she needs to stop Tirosent for 2 days and then restart as before but only take 6 days out of the week, she can miss one day whichever day she likes. Will recheck levels in 6 weeks, tell her not refill her rx as her  dose might need reduction

## 2018-03-09 NOTE — Telephone Encounter (Signed)
Pt was advised on stopping the Tirosint for two days and taking only 6 days a week. Pt understood

## 2018-03-14 DIAGNOSIS — M9901 Segmental and somatic dysfunction of cervical region: Secondary | ICD-10-CM | POA: Diagnosis not present

## 2018-03-14 DIAGNOSIS — M955 Acquired deformity of pelvis: Secondary | ICD-10-CM | POA: Diagnosis not present

## 2018-03-14 DIAGNOSIS — M5033 Other cervical disc degeneration, cervicothoracic region: Secondary | ICD-10-CM | POA: Diagnosis not present

## 2018-03-14 DIAGNOSIS — M9905 Segmental and somatic dysfunction of pelvic region: Secondary | ICD-10-CM | POA: Diagnosis not present

## 2018-03-21 DIAGNOSIS — M5033 Other cervical disc degeneration, cervicothoracic region: Secondary | ICD-10-CM | POA: Diagnosis not present

## 2018-03-21 DIAGNOSIS — M955 Acquired deformity of pelvis: Secondary | ICD-10-CM | POA: Diagnosis not present

## 2018-03-21 DIAGNOSIS — M9901 Segmental and somatic dysfunction of cervical region: Secondary | ICD-10-CM | POA: Diagnosis not present

## 2018-03-21 DIAGNOSIS — M9905 Segmental and somatic dysfunction of pelvic region: Secondary | ICD-10-CM | POA: Diagnosis not present

## 2018-03-26 NOTE — Progress Notes (Signed)
Ok that's fine, can u talk to me

## 2018-03-27 NOTE — Progress Notes (Signed)
Pt was notified of this information.

## 2018-03-28 DIAGNOSIS — M9905 Segmental and somatic dysfunction of pelvic region: Secondary | ICD-10-CM | POA: Diagnosis not present

## 2018-03-28 DIAGNOSIS — M5033 Other cervical disc degeneration, cervicothoracic region: Secondary | ICD-10-CM | POA: Diagnosis not present

## 2018-03-28 DIAGNOSIS — M955 Acquired deformity of pelvis: Secondary | ICD-10-CM | POA: Diagnosis not present

## 2018-03-28 DIAGNOSIS — M9901 Segmental and somatic dysfunction of cervical region: Secondary | ICD-10-CM | POA: Diagnosis not present

## 2018-04-04 DIAGNOSIS — M5033 Other cervical disc degeneration, cervicothoracic region: Secondary | ICD-10-CM | POA: Diagnosis not present

## 2018-04-04 DIAGNOSIS — M9905 Segmental and somatic dysfunction of pelvic region: Secondary | ICD-10-CM | POA: Diagnosis not present

## 2018-04-04 DIAGNOSIS — M955 Acquired deformity of pelvis: Secondary | ICD-10-CM | POA: Diagnosis not present

## 2018-04-04 DIAGNOSIS — M9901 Segmental and somatic dysfunction of cervical region: Secondary | ICD-10-CM | POA: Diagnosis not present

## 2018-04-06 ENCOUNTER — Ambulatory Visit (INDEPENDENT_AMBULATORY_CARE_PROVIDER_SITE_OTHER): Payer: 59

## 2018-04-06 DIAGNOSIS — E538 Deficiency of other specified B group vitamins: Secondary | ICD-10-CM | POA: Diagnosis not present

## 2018-04-06 MED ORDER — CYANOCOBALAMIN 1000 MCG/ML IJ SOLN
1000.0000 ug | Freq: Once | INTRAMUSCULAR | Status: AC
  Administered 2018-04-06: 1000 ug via INTRAMUSCULAR

## 2018-04-11 DIAGNOSIS — M9905 Segmental and somatic dysfunction of pelvic region: Secondary | ICD-10-CM | POA: Diagnosis not present

## 2018-04-11 DIAGNOSIS — M9901 Segmental and somatic dysfunction of cervical region: Secondary | ICD-10-CM | POA: Diagnosis not present

## 2018-04-11 DIAGNOSIS — M5033 Other cervical disc degeneration, cervicothoracic region: Secondary | ICD-10-CM | POA: Diagnosis not present

## 2018-04-11 DIAGNOSIS — M955 Acquired deformity of pelvis: Secondary | ICD-10-CM | POA: Diagnosis not present

## 2018-04-18 DIAGNOSIS — M955 Acquired deformity of pelvis: Secondary | ICD-10-CM | POA: Diagnosis not present

## 2018-04-18 DIAGNOSIS — M5033 Other cervical disc degeneration, cervicothoracic region: Secondary | ICD-10-CM | POA: Diagnosis not present

## 2018-04-18 DIAGNOSIS — M9905 Segmental and somatic dysfunction of pelvic region: Secondary | ICD-10-CM | POA: Diagnosis not present

## 2018-04-18 DIAGNOSIS — M9901 Segmental and somatic dysfunction of cervical region: Secondary | ICD-10-CM | POA: Diagnosis not present

## 2018-04-25 DIAGNOSIS — M955 Acquired deformity of pelvis: Secondary | ICD-10-CM | POA: Diagnosis not present

## 2018-04-25 DIAGNOSIS — M9901 Segmental and somatic dysfunction of cervical region: Secondary | ICD-10-CM | POA: Diagnosis not present

## 2018-04-25 DIAGNOSIS — M5033 Other cervical disc degeneration, cervicothoracic region: Secondary | ICD-10-CM | POA: Diagnosis not present

## 2018-04-25 DIAGNOSIS — M9905 Segmental and somatic dysfunction of pelvic region: Secondary | ICD-10-CM | POA: Diagnosis not present

## 2018-04-30 DIAGNOSIS — R922 Inconclusive mammogram: Secondary | ICD-10-CM | POA: Diagnosis not present

## 2018-04-30 DIAGNOSIS — R928 Other abnormal and inconclusive findings on diagnostic imaging of breast: Secondary | ICD-10-CM | POA: Diagnosis not present

## 2018-05-01 DIAGNOSIS — N6310 Unspecified lump in the right breast, unspecified quadrant: Secondary | ICD-10-CM | POA: Diagnosis not present

## 2018-05-02 DIAGNOSIS — N644 Mastodynia: Secondary | ICD-10-CM | POA: Diagnosis not present

## 2018-05-02 DIAGNOSIS — M9905 Segmental and somatic dysfunction of pelvic region: Secondary | ICD-10-CM | POA: Diagnosis not present

## 2018-05-02 DIAGNOSIS — M5033 Other cervical disc degeneration, cervicothoracic region: Secondary | ICD-10-CM | POA: Diagnosis not present

## 2018-05-02 DIAGNOSIS — M955 Acquired deformity of pelvis: Secondary | ICD-10-CM | POA: Diagnosis not present

## 2018-05-02 DIAGNOSIS — N6310 Unspecified lump in the right breast, unspecified quadrant: Secondary | ICD-10-CM | POA: Diagnosis not present

## 2018-05-02 DIAGNOSIS — M9901 Segmental and somatic dysfunction of cervical region: Secondary | ICD-10-CM | POA: Diagnosis not present

## 2018-05-17 ENCOUNTER — Other Ambulatory Visit: Payer: Self-pay

## 2018-05-17 DIAGNOSIS — R0602 Shortness of breath: Secondary | ICD-10-CM

## 2018-05-17 MED ORDER — UMECLIDINIUM-VILANTEROL 62.5-25 MCG/INH IN AEPB: INHALATION_SPRAY | RESPIRATORY_TRACT | 2 refills | Status: DC

## 2018-05-30 DIAGNOSIS — N6310 Unspecified lump in the right breast, unspecified quadrant: Secondary | ICD-10-CM | POA: Diagnosis not present

## 2018-05-30 DIAGNOSIS — N63 Unspecified lump in unspecified breast: Secondary | ICD-10-CM | POA: Diagnosis not present

## 2018-06-06 DIAGNOSIS — M9905 Segmental and somatic dysfunction of pelvic region: Secondary | ICD-10-CM | POA: Diagnosis not present

## 2018-06-06 DIAGNOSIS — M9901 Segmental and somatic dysfunction of cervical region: Secondary | ICD-10-CM | POA: Diagnosis not present

## 2018-06-06 DIAGNOSIS — M5033 Other cervical disc degeneration, cervicothoracic region: Secondary | ICD-10-CM | POA: Diagnosis not present

## 2018-06-06 DIAGNOSIS — M955 Acquired deformity of pelvis: Secondary | ICD-10-CM | POA: Diagnosis not present

## 2018-06-12 ENCOUNTER — Ambulatory Visit (INDEPENDENT_AMBULATORY_CARE_PROVIDER_SITE_OTHER): Payer: 59 | Admitting: Adult Health

## 2018-06-12 ENCOUNTER — Other Ambulatory Visit: Payer: Self-pay | Admitting: Internal Medicine

## 2018-06-12 ENCOUNTER — Encounter: Payer: Self-pay | Admitting: Adult Health

## 2018-06-12 VITALS — BP 116/78 | HR 85 | Resp 16 | Ht 64.0 in | Wt 206.0 lb

## 2018-06-12 DIAGNOSIS — R0602 Shortness of breath: Secondary | ICD-10-CM

## 2018-06-12 DIAGNOSIS — J4520 Mild intermittent asthma, uncomplicated: Secondary | ICD-10-CM | POA: Diagnosis not present

## 2018-06-12 DIAGNOSIS — J449 Chronic obstructive pulmonary disease, unspecified: Secondary | ICD-10-CM

## 2018-06-12 DIAGNOSIS — E669 Obesity, unspecified: Secondary | ICD-10-CM

## 2018-06-12 DIAGNOSIS — E538 Deficiency of other specified B group vitamins: Secondary | ICD-10-CM

## 2018-06-12 DIAGNOSIS — Z0189 Encounter for other specified special examinations: Secondary | ICD-10-CM

## 2018-06-12 MED ORDER — CYANOCOBALAMIN 1000 MCG/ML IJ SOLN
1000.0000 ug | Freq: Once | INTRAMUSCULAR | Status: AC
  Administered 2018-06-12: 1000 ug via INTRAMUSCULAR

## 2018-06-12 MED ORDER — PNEUMOCOCCAL VAC POLYVALENT 25 MCG/0.5ML IJ INJ: 0.5000 mL | INJECTION | INTRAMUSCULAR | 0 refills | Status: AC

## 2018-06-12 NOTE — Patient Instructions (Signed)

## 2018-06-12 NOTE — Progress Notes (Signed)
[facility] [place], [place] [address]  Pulmonary Sleep Medicine   Office Visit Note  Patient Name: Karla Turner DOB: [birth year] MRN [id number]  Date of Service: 06/12/2018  Complaints/HPI: Pt here for pulmonary follow up. She denies Hospitalization since last visit.  She states she is taking her Anoro daily.  She denies using rescue inhaler.  She reports overall good control of her breathing, with her daily medications.    ROS  General: (-) fever, (-) chills, (-) night sweats, (-) weakness Skin: (-) rashes, (-) itching,. Eyes: (-) visual changes, (-) redness, (-) itching. Nose and Sinuses: (-) nasal stuffiness or itchiness, (-) postnasal drip, (-) nosebleeds, (-) sinus trouble. Mouth and Throat: (-) sore throat, (-) hoarseness. Neck: (-) swollen glands, (-) enlarged thyroid, (-) neck pain. Respiratory: - cough, (-) bloody sputum, - shortness of breath, - wheezing. Cardiovascular: - ankle swelling, (-) chest pain. Lymphatic: (-) lymph node enlargement. Neurologic: (-) numbness, (-) tingling. Psychiatric: (-) anxiety, (-) depression   Current Medication: Outpatient Encounter Medications as of 06/12/2018  Medication Sig  . albuterol (PROVENTIL,VENTOLIN) 2 MG/5ML syrup Take 2 mg by mouth 3 (three) times daily.  . Brinzolamide-Brimonidine (SIMBRINZA) 1-0.2 % SUSP Apply to eye.  . cycloSPORINE (RESTASIS) 0.05 % ophthalmic emulsion Place 1 drop into both eyes 2 (two) times daily.  Marland Kitchen omeprazole (PRILOSEC OTC) 20 MG tablet Take by mouth.  . TIROSINT 100 MCG CAPS Take 1 capsule by mouth daily. Every morning on an empty stomach  . umeclidinium-vilanterol (ANORO ELLIPTA) 62.5-25 MCG/INH AEPB INHALE ONE DOSE BY MOUTH DAILY  . aspirin 325 MG EC tablet Take 325 mg by mouth daily.  . [DISCONTINUED] brimonidine (ALPHAGAN P) 0.1 % SOLN 1 drop 2 (two) times daily.  . [DISCONTINUED] brimonidine-timolol (COMBIGAN) 0.2-0.5 % ophthalmic solution Place 1 drop into the right  eye every 12 (twelve) hours.  . [DISCONTINUED] buPROPion (WELLBUTRIN XL) 150 MG 24 hr tablet Take 150 mg by mouth daily.  . [DISCONTINUED] HYDROcodone-acetaminophen (NORCO/VICODIN) 5-325 MG tablet Take 1 tablet by mouth every 4 (four) hours as needed. (Patient not taking: Reported on 11/20/2017)  . [DISCONTINUED] mometasone-formoterol (DULERA) 100-5 MCG/ACT AERO Inhale 2 puffs into the lungs 2 (two) times daily.  . [DISCONTINUED] ondansetron (ZOFRAN ODT) 4 MG disintegrating tablet Take 1 tablet (4 mg total) by mouth every 8 (eight) hours as needed for nausea or vomiting. (Patient not taking: Reported on 11/20/2017)  . [DISCONTINUED] oxyCODONE-acetaminophen (PERCOCET) 5-325 MG tablet Take 1 tablet by mouth every 6 (six) hours as needed for severe pain. (Patient not taking: Reported on 07/20/2017)  . [DISCONTINUED] umeclidinium-vilanterol (ANORO ELLIPTA) 62.5-25 MCG/INH AEPB Inhale 1 puff into the lungs daily.   No facility-administered encounter medications on file as of 06/12/2018.     Surgical History: Past Surgical History:  Procedure Laterality Date  . cataract surgery Bilateral   . COLONOSCOPY    . COLONOSCOPY WITH PROPOFOL N/A 01/29/2018   Procedure: COLONOSCOPY WITH PROPOFOL;  Surgeon: Manya Silvas, MD;  Location: [facility] ENDOSCOPY;  Service: Endoscopy;  Laterality: N/A;  . ESOPHAGOGASTRODUODENOSCOPY (EGD) WITH PROPOFOL N/A 01/29/2018   Procedure: ESOPHAGOGASTRODUODENOSCOPY (EGD) WITH PROPOFOL;  Surgeon: Manya Silvas, MD;  Location: [facility] ENDOSCOPY;  Service: Endoscopy;  Laterality: N/A;  . mamoplasty Bilateral     Medical History: Past Medical History:  Diagnosis Date  . Asthma   . Bronchitis   . Cataracts, bilateral   . Depression   . GERD (gastroesophageal reflux disease)   . Glaucoma   .  Hypothyroidism   . Lumbar herniated disc     Family History: Family History  Problem Relation Age of Onset  . Hypertension Mother   . Breast cancer Sister     Social  History: Social History   Socioeconomic History  . Marital status: Married    Spouse name: Not on file  . Number of children: Not on file  . Years of education: Not on file  . Highest education level: Not on file  Occupational History  . Not on file  Social Needs  . Financial resource strain: Not on file  . Food insecurity:    Worry: Not on file    Inability: Not on file  . Transportation needs:    Medical: Not on file    Non-medical: Not on file  Tobacco Use  . Smoking status: Never Smoker  . Smokeless tobacco: Never Used  Substance and Sexual Activity  . Alcohol use: No  . Drug use: No  . Sexual activity: Never  Lifestyle  . Physical activity:    Days per week: Not on file    Minutes per session: Not on file  . Stress: Not on file  Relationships  . Social connections:    Talks on phone: Not on file    Gets together: Not on file    Attends religious service: Not on file    Active member of club or organization: Not on file    Attends meetings of clubs or organizations: Not on file    Relationship status: Not on file  . Intimate partner violence:    Fear of current or ex partner: Not on file    Emotionally abused: Not on file    Physically abused: Not on file    Forced sexual activity: Not on file  Other Topics Concern  . Not on file  Social History Narrative   Lives at home with family    Vital Signs: Blood pressure 116/78, pulse 85, resp. rate 16, height 5\' 4"  (1.626 m), weight 206 lb (93.4 kg), SpO2 94 %.  Examination: General Appearance: The patient is well-developed, well-nourished, and in no distress. Skin: Gross inspection of skin unremarkable. Head: normocephalic, no gross deformities. Eyes: no gross deformities noted. ENT: ears appear grossly normal no exudates. Neck: Supple. No thyromegaly. No LAD. Respiratory: Clear bilaterally, no wheezing noted. Cardiovascular: Normal S1 and S2 without murmur or rub. Extremities: No cyanosis. pulses are  equal. Neurologic: Alert and oriented. No involuntary movements.  LABS: No results found for this or any previous visit (from the past 2160 hour(s)).  Radiology: No results found.  No results found.  No results found.    Assessment and Plan: Patient Active Problem List   Diagnosis Date Noted  . Colon polyp 12/18/2017  . Chronic obstructive pulmonary disease (COPD) ([place]) 12/18/2017  . Shortness of breath 12/18/2017  . Vitamin B12 deficiency anemia 12/18/2017  . Asthma 11/17/2017  . GERD (gastroesophageal reflux disease) 11/17/2017  . Hx of adenomatous colonic polyps 11/17/2017  . Obesity (BMI 35.0-39.9 without comorbidity) 11/17/2017  . Pelvic mass in female 01/10/2017  . Varicose veins 07/24/2015  . Glaucoma suspect of right eye 04/15/2014  . Infection and inflammatory reaction due to other internal prosthetic devices, implants and grafts, initial encounter ([place]) 04/15/2014  . Pseudophakia of both eyes 04/15/2014  . Cystoid macular edema 03/18/2013  . Epiretinal membrane 03/18/2013   1. Mild intermittent chronic asthma without complication Stable, continue medications as directed.   2. Chronic obstructive pulmonary disease,  unspecified COPD type ([place]) Stable.  Continue to use inhalers as prescribed.  Follow up as needed.  Avoid excessive exposure to heat.  3. SOB (shortness of breath) [place]  4. Obesity (BMI 35.0-39.9 without comorbidity) Obesity Counseling: Risk Assessment: An assessment of behavioral risk factors was made today and includes lack of exercise sedentary lifestyle, lack of portion control and poor dietary habits.  Risk Modification Advice: She was counseled on portion control guidelines. Restricting daily caloric intake to. . The detrimental long term effects of obesity on her health and ongoing poor compliance was also discussed with the patient.  5. Encounter for laboratory test Pt requesting lab values. - CBC with Differential/Platelet - Lipid Panel  With LDL/HDL Ratio - Comprehensive metabolic panel - Y57 and Folate Panel - Vitamin D 1,25 dihydroxy - Fe+TIBC+Fer - Hepatitis c antibody (reflex) - HIV antibody (with reflex)    General Counseling: I have discussed the findings of the evaluation and examination with Lelan Pons.  I have also discussed any further diagnostic evaluation thatmay be needed or ordered today. Ezri verbalizes understanding of the findings of todays visit. We also reviewed her medications today and discussed drug interactions and side effects including but not limited excessive drowsiness and altered mental states. We also discussed that there is always a risk not just to her but also people around her. she has been encouraged to call the office with any questions or concerns that should arise related to todays visit.    Time spent: 25 This patient was seen by Orson Gear AGNP-C in Collaboration with Dr. Devona Konig as a part of collaborative care agreement.  I have personally obtained a history, examined the patient, evaluated laboratory and imaging results, formulated the assessment and plan and placed orders.    Allyne Gee, MD [facility] Pulmonary and Critical Care Sleep medicine

## 2018-06-13 DIAGNOSIS — M9901 Segmental and somatic dysfunction of cervical region: Secondary | ICD-10-CM | POA: Diagnosis not present

## 2018-06-13 DIAGNOSIS — M9905 Segmental and somatic dysfunction of pelvic region: Secondary | ICD-10-CM | POA: Diagnosis not present

## 2018-06-13 DIAGNOSIS — M5033 Other cervical disc degeneration, cervicothoracic region: Secondary | ICD-10-CM | POA: Diagnosis not present

## 2018-06-13 DIAGNOSIS — M955 Acquired deformity of pelvis: Secondary | ICD-10-CM | POA: Diagnosis not present

## 2018-06-14 ENCOUNTER — Encounter: Payer: Self-pay | Admitting: Adult Health

## 2018-06-14 DIAGNOSIS — N6314 Unspecified lump in the right breast, lower inner quadrant: Secondary | ICD-10-CM | POA: Diagnosis not present

## 2018-06-14 DIAGNOSIS — Z803 Family history of malignant neoplasm of breast: Secondary | ICD-10-CM | POA: Diagnosis not present

## 2018-06-14 DIAGNOSIS — E039 Hypothyroidism, unspecified: Secondary | ICD-10-CM | POA: Diagnosis not present

## 2018-06-14 DIAGNOSIS — N6310 Unspecified lump in the right breast, unspecified quadrant: Secondary | ICD-10-CM | POA: Diagnosis not present

## 2018-06-18 DIAGNOSIS — K219 Gastro-esophageal reflux disease without esophagitis: Secondary | ICD-10-CM | POA: Diagnosis not present

## 2018-06-18 DIAGNOSIS — K2960 Other gastritis without bleeding: Secondary | ICD-10-CM | POA: Diagnosis not present

## 2018-06-19 DIAGNOSIS — M9905 Segmental and somatic dysfunction of pelvic region: Secondary | ICD-10-CM | POA: Diagnosis not present

## 2018-06-19 DIAGNOSIS — M5033 Other cervical disc degeneration, cervicothoracic region: Secondary | ICD-10-CM | POA: Diagnosis not present

## 2018-06-19 DIAGNOSIS — M9901 Segmental and somatic dysfunction of cervical region: Secondary | ICD-10-CM | POA: Diagnosis not present

## 2018-06-19 DIAGNOSIS — M955 Acquired deformity of pelvis: Secondary | ICD-10-CM | POA: Diagnosis not present

## 2018-06-20 DIAGNOSIS — Z09 Encounter for follow-up examination after completed treatment for conditions other than malignant neoplasm: Secondary | ICD-10-CM | POA: Diagnosis not present

## 2018-06-20 DIAGNOSIS — N6310 Unspecified lump in the right breast, unspecified quadrant: Secondary | ICD-10-CM | POA: Diagnosis not present

## 2018-06-27 DIAGNOSIS — M955 Acquired deformity of pelvis: Secondary | ICD-10-CM | POA: Diagnosis not present

## 2018-06-27 DIAGNOSIS — M9905 Segmental and somatic dysfunction of pelvic region: Secondary | ICD-10-CM | POA: Diagnosis not present

## 2018-06-27 DIAGNOSIS — M9901 Segmental and somatic dysfunction of cervical region: Secondary | ICD-10-CM | POA: Diagnosis not present

## 2018-06-27 DIAGNOSIS — M5033 Other cervical disc degeneration, cervicothoracic region: Secondary | ICD-10-CM | POA: Diagnosis not present

## 2018-07-04 DIAGNOSIS — T8579XA Infection and inflammatory reaction due to other internal prosthetic devices, implants and grafts, initial encounter: Secondary | ICD-10-CM | POA: Diagnosis not present

## 2018-07-11 DIAGNOSIS — M5033 Other cervical disc degeneration, cervicothoracic region: Secondary | ICD-10-CM | POA: Diagnosis not present

## 2018-07-11 DIAGNOSIS — M9901 Segmental and somatic dysfunction of cervical region: Secondary | ICD-10-CM | POA: Diagnosis not present

## 2018-07-11 DIAGNOSIS — M9905 Segmental and somatic dysfunction of pelvic region: Secondary | ICD-10-CM | POA: Diagnosis not present

## 2018-07-11 DIAGNOSIS — M955 Acquired deformity of pelvis: Secondary | ICD-10-CM | POA: Diagnosis not present

## 2018-07-16 DIAGNOSIS — T8579XA Infection and inflammatory reaction due to other internal prosthetic devices, implants and grafts, initial encounter: Secondary | ICD-10-CM | POA: Diagnosis not present

## 2018-07-17 ENCOUNTER — Ambulatory Visit (INDEPENDENT_AMBULATORY_CARE_PROVIDER_SITE_OTHER): Payer: 59 | Admitting: Internal Medicine

## 2018-07-17 ENCOUNTER — Encounter: Payer: Self-pay | Admitting: Internal Medicine

## 2018-07-17 VITALS — BP 130/80 | HR 71 | Resp 16 | Ht 64.0 in | Wt 210.0 lb

## 2018-07-17 DIAGNOSIS — E039 Hypothyroidism, unspecified: Secondary | ICD-10-CM | POA: Diagnosis not present

## 2018-07-17 DIAGNOSIS — K219 Gastro-esophageal reflux disease without esophagitis: Secondary | ICD-10-CM | POA: Diagnosis not present

## 2018-07-17 DIAGNOSIS — R0602 Shortness of breath: Secondary | ICD-10-CM

## 2018-07-17 NOTE — Progress Notes (Signed)
[facility] [place], [place] [address]  Internal MEDICINE  Office Visit Note  Patient Name: Karla Turner  [id number]  [id number]  Date of Service: 07/17/2018  Chief Complaint  Patient presents with  . Asthma    6 month follow up  . Gastroesophageal Reflux  . Quality Metric Gaps    pneumovax   Asthma  She complains of chest tightness, difficulty breathing and shortness of breath. There is no cough or wheezing. This is a recurrent problem. The current episode started more than 1 month ago. The problem occurs daily. The problem has been gradually worsening. Associated symptoms include heartburn. Pertinent negatives include no chest pain, ear pain, headaches or postnasal drip. Her symptoms are aggravated by minimal activity. Her symptoms are alleviated by steroid inhaler. Her past medical history is significant for asthma.  Gastroesophageal Reflux  She complains of heartburn. She reports no abdominal pain, no chest pain, no coughing, no nausea or no wheezing. sob which is worsening . This is a recurrent problem. The problem has been waxing and waning. The heartburn is of moderate intensity. The heartburn does not wake her from sleep. The heartburn limits her activity. Pertinent negatives include no fatigue.  Other  This is a new (quality metric gaps ( pneumovax), pt will need new TSH/T4 levels since she is taking her medicine 6 days/week ) problem. Pertinent negatives include no abdominal pain, arthralgias, chest pain, chills, coughing, diaphoresis, fatigue, headaches, nausea, neck pain or vomiting. Treatments tried: will need to find out from her pharmacy.   Current Medication: Outpatient Encounter Medications as of 07/17/2018  Medication Sig  . albuterol (PROVENTIL,VENTOLIN) 2 MG/5ML syrup Take 2 mg by mouth 3 (three) times daily.  . Brinzolamide-Brimonidine (SIMBRINZA) 1-0.2 % SUSP Apply to eye.  . cycloSPORINE (RESTASIS) 0.05 % ophthalmic emulsion Place 1 drop  into both eyes 2 (two) times daily.  Marland Kitchen omeprazole (PRILOSEC OTC) 20 MG tablet Take by mouth.  . prednisoLONE acetate (PREDNISOLONE ACETATE P-F) 1 % ophthalmic suspension 1 drop 4 (four) times daily.  Marland Kitchen TIROSINT 100 MCG CAPS Take 1 capsule by mouth daily. Every morning on an empty stomach  . umeclidinium-vilanterol (ANORO ELLIPTA) 62.5-25 MCG/INH AEPB INHALE ONE DOSE BY MOUTH DAILY  . aspirin 325 MG EC tablet Take 325 mg by mouth daily.   No facility-administered encounter medications on file as of 07/17/2018.     Surgical History: Past Surgical History:  Procedure Laterality Date  . cataract surgery Bilateral   . COLONOSCOPY    . COLONOSCOPY WITH PROPOFOL N/A 01/29/2018   Procedure: COLONOSCOPY WITH PROPOFOL;  Surgeon: Manya Silvas, MD;  Location: [facility] ENDOSCOPY;  Service: Endoscopy;  Laterality: N/A;  . ESOPHAGOGASTRODUODENOSCOPY (EGD) WITH PROPOFOL N/A 01/29/2018   Procedure: ESOPHAGOGASTRODUODENOSCOPY (EGD) WITH PROPOFOL;  Surgeon: Manya Silvas, MD;  Location: [facility] ENDOSCOPY;  Service: Endoscopy;  Laterality: N/A;  . mamoplasty Bilateral     Medical History: Past Medical History:  Diagnosis Date  . Asthma   . Bronchitis   . Cataracts, bilateral   . Depression   . GERD (gastroesophageal reflux disease)   . Glaucoma   . Hypothyroidism   . Lumbar herniated disc     Family History: Family History  Problem Relation Age of Onset  . Hypertension Mother   . Breast cancer Sister     Social History   Socioeconomic History  . Marital status: Married    Spouse name: Not on file  . Number of children: Not on file  .  Years of education: Not on file  . Highest education level: Not on file  Occupational History  . Not on file  Social Needs  . Financial resource strain: Not on file  . Food insecurity:    Worry: Not on file    Inability: Not on file  . Transportation needs:    Medical: Not on file    Non-medical: Not on file  Tobacco Use  . Smoking status: Never  Smoker  . Smokeless tobacco: Never Used  Substance and Sexual Activity  . Alcohol use: Yes    Comment: occasionally  . Drug use: No  . Sexual activity: Never  Lifestyle  . Physical activity:    Days per week: Not on file    Minutes per session: Not on file  . Stress: Not on file  Relationships  . Social connections:    Talks on phone: Not on file    Gets together: Not on file    Attends religious service: Not on file    Active member of club or organization: Not on file    Attends meetings of clubs or organizations: Not on file    Relationship status: Not on file  . Intimate partner violence:    Fear of current or ex partner: Not on file    Emotionally abused: Not on file    Physically abused: Not on file    Forced sexual activity: Not on file  Other Topics Concern  . Not on file  Social History Narrative   Lives at home with family      Review of Systems  Constitutional: Negative for chills, diaphoresis and fatigue.  HENT: Negative for ear pain, postnasal drip and sinus pressure.   Eyes: Negative for photophobia, discharge, redness, itching and visual disturbance.  Respiratory: Positive for shortness of breath. Negative for cough and wheezing.   Cardiovascular: Negative for chest pain, palpitations and leg swelling.  Gastrointestinal: Positive for heartburn. Negative for abdominal pain, constipation, diarrhea, nausea and vomiting.  Genitourinary: Negative for dysuria and flank pain.  Musculoskeletal: Negative for arthralgias, back pain, gait problem and neck pain.  Skin: Negative for color change.  Allergic/Immunologic: Negative for environmental allergies and food allergies.  Neurological: Negative for dizziness and headaches.  Hematological: Does not bruise/bleed easily.  Psychiatric/Behavioral: Negative for agitation, behavioral problems (depression) and hallucinations.   Vital Signs: BP 130/80 (BP Location: Left Arm, Patient Position: Sitting, Cuff Size: Normal)    Pulse 71   Resp 16   Ht '5\' 4"'$  (1.626 m)   Wt 210 lb (95.3 kg)   SpO2 98%   BMI 36.05 kg/m   Physical Exam  Constitutional: She is oriented to person, place, and time. She appears well-developed and well-nourished. No distress.  HENT:  Head: Normocephalic and atraumatic.  Mouth/Throat: Oropharynx is clear and moist. No oropharyngeal exudate.  Eyes: Pupils are equal, round, and reactive to light. EOM are normal.  Neck: Normal range of motion. Neck supple. No JVD present. No tracheal deviation present. No thyromegaly present.  Cardiovascular: Normal rate, regular rhythm and normal heart sounds. Exam reveals no gallop and no friction rub.  No murmur heard. Pulmonary/Chest: Effort normal. No respiratory distress. She has no wheezes. She has no rales. She exhibits no tenderness.  Abdominal: Soft. Bowel sounds are normal.  Musculoskeletal: Normal range of motion.  Lymphadenopathy:    She has no cervical adenopathy.  Neurological: She is alert and oriented to person, place, and time. No cranial nerve deficit.  Skin: Skin is  warm and dry. She is not diaphoretic.  Psychiatric: She has a normal mood and affect. Her behavior is normal. Judgment and thought content normal.   Assessment/Plan: 1. SOB (shortness of breath) on exertion Worsening SOB, Pt didn't desaturate on ambulation, she is not using her rescue inhaler either. Will get CT scan of the chest and add connective tissue disease work up - Sed Rate (ESR) - ANA Direct w/Reflex if Positive - 6 minute walk  2. GERD without esophagitis Worsening GERD, continue PPI - DG UGI W/O KUB; Future  3. Hypothyroidism, unspecified type Continue Tirosint 6 days per week and check labs   - TSH + free T4  General Counseling: tyrell brereton understanding of the findings of todays visit and agrees with plan of treatment. I have discussed any further diagnostic evaluation that may be needed or ordered today. We also reviewed her medications today.  she has been encouraged to call the office with any questions or concerns that should arise related to todays visit.    Orders Placed This Encounter  Procedures  . DG UGI W/O KUB  . CT CHEST WO CONTRAST  . Sed Rate (ESR)  . ANA Direct w/Reflex if Positive  . TSH + free T4  . 6 minute walk    Time spent: 25 Minutes   Dr Lavera Guise Internal medicine

## 2018-07-18 DIAGNOSIS — M5033 Other cervical disc degeneration, cervicothoracic region: Secondary | ICD-10-CM | POA: Diagnosis not present

## 2018-07-18 DIAGNOSIS — M955 Acquired deformity of pelvis: Secondary | ICD-10-CM | POA: Diagnosis not present

## 2018-07-18 DIAGNOSIS — M9901 Segmental and somatic dysfunction of cervical region: Secondary | ICD-10-CM | POA: Diagnosis not present

## 2018-07-18 DIAGNOSIS — M9905 Segmental and somatic dysfunction of pelvic region: Secondary | ICD-10-CM | POA: Diagnosis not present

## 2018-07-24 DIAGNOSIS — T8579XA Infection and inflammatory reaction due to other internal prosthetic devices, implants and grafts, initial encounter: Secondary | ICD-10-CM | POA: Diagnosis not present

## 2018-07-25 ENCOUNTER — Ambulatory Visit
Admission: RE | Admit: 2018-07-25 | Discharge: 2018-07-25 | Disposition: A | Discharge status: Home / Self Care | Payer: 59 | Source: Ambulatory Visit | Attending: Internal Medicine | Admitting: Internal Medicine

## 2018-07-25 DIAGNOSIS — K219 Gastro-esophageal reflux disease without esophagitis: Secondary | ICD-10-CM | POA: Diagnosis not present

## 2018-07-25 DIAGNOSIS — R0600 Dyspnea, unspecified: Secondary | ICD-10-CM | POA: Insufficient documentation

## 2018-07-25 DIAGNOSIS — K5710 Diverticulosis of small intestine without perforation or abscess without bleeding: Secondary | ICD-10-CM | POA: Insufficient documentation

## 2018-07-25 DIAGNOSIS — K449 Diaphragmatic hernia without obstruction or gangrene: Secondary | ICD-10-CM | POA: Insufficient documentation

## 2018-07-25 NOTE — Progress Notes (Signed)
Please let pt know that she has reflux on her. Is she taking anything for her stomach, keep follow up as before

## 2018-07-26 DIAGNOSIS — T8579XA Infection and inflammatory reaction due to other internal prosthetic devices, implants and grafts, initial encounter: Secondary | ICD-10-CM | POA: Diagnosis not present

## 2018-07-27 ENCOUNTER — Telehealth: Payer: Self-pay

## 2018-07-27 NOTE — Telephone Encounter (Signed)
Pt advised that UGI showed reflux continue taking omeprazole

## 2018-07-31 ENCOUNTER — Other Ambulatory Visit: Payer: Self-pay | Admitting: Internal Medicine

## 2018-07-31 ENCOUNTER — Ambulatory Visit: Payer: Self-pay | Admitting: Internal Medicine

## 2018-08-01 DIAGNOSIS — M5033 Other cervical disc degeneration, cervicothoracic region: Secondary | ICD-10-CM | POA: Diagnosis not present

## 2018-08-01 DIAGNOSIS — M9905 Segmental and somatic dysfunction of pelvic region: Secondary | ICD-10-CM | POA: Diagnosis not present

## 2018-08-01 DIAGNOSIS — M9901 Segmental and somatic dysfunction of cervical region: Secondary | ICD-10-CM | POA: Diagnosis not present

## 2018-08-01 DIAGNOSIS — M955 Acquired deformity of pelvis: Secondary | ICD-10-CM | POA: Diagnosis not present

## 2018-08-02 DIAGNOSIS — T8579XA Infection and inflammatory reaction due to other internal prosthetic devices, implants and grafts, initial encounter: Secondary | ICD-10-CM | POA: Diagnosis not present

## 2018-08-06 ENCOUNTER — Telehealth: Payer: Self-pay | Admitting: Internal Medicine

## 2018-08-06 NOTE — Telephone Encounter (Signed)
Left message and advised patient her CT scsn was denied and we will discuss at her next follow up>Beth

## 2018-08-08 DIAGNOSIS — M955 Acquired deformity of pelvis: Secondary | ICD-10-CM | POA: Diagnosis not present

## 2018-08-08 DIAGNOSIS — M5033 Other cervical disc degeneration, cervicothoracic region: Secondary | ICD-10-CM | POA: Diagnosis not present

## 2018-08-08 DIAGNOSIS — M9901 Segmental and somatic dysfunction of cervical region: Secondary | ICD-10-CM | POA: Diagnosis not present

## 2018-08-08 DIAGNOSIS — M9905 Segmental and somatic dysfunction of pelvic region: Secondary | ICD-10-CM | POA: Diagnosis not present

## 2018-08-09 DIAGNOSIS — T8579XA Infection and inflammatory reaction due to other internal prosthetic devices, implants and grafts, initial encounter: Secondary | ICD-10-CM | POA: Diagnosis not present

## 2018-08-13 DIAGNOSIS — M9905 Segmental and somatic dysfunction of pelvic region: Secondary | ICD-10-CM | POA: Diagnosis not present

## 2018-08-13 DIAGNOSIS — M955 Acquired deformity of pelvis: Secondary | ICD-10-CM | POA: Diagnosis not present

## 2018-08-13 DIAGNOSIS — M5033 Other cervical disc degeneration, cervicothoracic region: Secondary | ICD-10-CM | POA: Diagnosis not present

## 2018-08-13 DIAGNOSIS — M9901 Segmental and somatic dysfunction of cervical region: Secondary | ICD-10-CM | POA: Diagnosis not present

## 2018-08-14 DIAGNOSIS — T8579XA Infection and inflammatory reaction due to other internal prosthetic devices, implants and grafts, initial encounter: Secondary | ICD-10-CM | POA: Diagnosis not present

## 2018-08-22 DIAGNOSIS — M955 Acquired deformity of pelvis: Secondary | ICD-10-CM | POA: Diagnosis not present

## 2018-08-22 DIAGNOSIS — M5033 Other cervical disc degeneration, cervicothoracic region: Secondary | ICD-10-CM | POA: Diagnosis not present

## 2018-08-22 DIAGNOSIS — M9901 Segmental and somatic dysfunction of cervical region: Secondary | ICD-10-CM | POA: Diagnosis not present

## 2018-08-22 DIAGNOSIS — M9905 Segmental and somatic dysfunction of pelvic region: Secondary | ICD-10-CM | POA: Diagnosis not present

## 2018-08-24 DIAGNOSIS — Z0189 Encounter for other specified special examinations: Secondary | ICD-10-CM | POA: Diagnosis not present

## 2018-08-27 DIAGNOSIS — T8579XA Infection and inflammatory reaction due to other internal prosthetic devices, implants and grafts, initial encounter: Secondary | ICD-10-CM | POA: Diagnosis not present

## 2018-08-28 ENCOUNTER — Ambulatory Visit (INDEPENDENT_AMBULATORY_CARE_PROVIDER_SITE_OTHER): Payer: 59 | Admitting: Internal Medicine

## 2018-08-28 ENCOUNTER — Encounter: Payer: Self-pay | Admitting: Internal Medicine

## 2018-08-28 DIAGNOSIS — E039 Hypothyroidism, unspecified: Secondary | ICD-10-CM | POA: Diagnosis not present

## 2018-08-28 DIAGNOSIS — K219 Gastro-esophageal reflux disease without esophagitis: Secondary | ICD-10-CM

## 2018-08-28 DIAGNOSIS — R0602 Shortness of breath: Secondary | ICD-10-CM

## 2018-08-28 DIAGNOSIS — E782 Mixed hyperlipidemia: Secondary | ICD-10-CM | POA: Diagnosis not present

## 2018-08-28 DIAGNOSIS — R768 Other specified abnormal immunological findings in serum: Secondary | ICD-10-CM | POA: Diagnosis not present

## 2018-08-28 NOTE — Progress Notes (Signed)
[facility] [place], [place] [address]  Internal MEDICINE  Office Visit Note  Patient Name: Karla Turner  [id number]  [id number]  Date of Service: 08/28/2018  Chief Complaint  Patient presents with  . Medical Management of Chronic Issues    2 week follow up  . Labs Only    review CT and labs   HPI Pt is here to have a follow up on her diagnostics. CT scan of the chest .. Pending Labs. Thyroid is better, she continues to be on Tirosint,  UGI. Moderate reflux .Marland Kitchen PPI ANA positive with RNP positive  Pt continues to have SOB, further cardiopulmonary work up will be needed   Current Medication: Outpatient Encounter Medications as of 08/28/2018  Medication Sig  . albuterol (PROVENTIL,VENTOLIN) 2 MG/5ML syrup Take 2 mg by mouth 3 (three) times daily.  Marland Kitchen aspirin 325 MG EC tablet Take 325 mg by mouth daily.  . Brinzolamide-Brimonidine (SIMBRINZA) 1-0.2 % SUSP Apply to eye.  . cycloSPORINE (RESTASIS) 0.05 % ophthalmic emulsion Place 1 drop into both eyes 2 (two) times daily.  Marland Kitchen omeprazole (PRILOSEC OTC) 20 MG tablet Take by mouth.  . prednisoLONE acetate (PREDNISOLONE ACETATE P-F) 1 % ophthalmic suspension 1 drop 4 (four) times daily.  Marland Kitchen TIROSINT 100 MCG CAPS TAKE ONE CAPSULE BY MOUTH EVERY MORNING ON AN EMPTY STOMACH  . umeclidinium-vilanterol (ANORO ELLIPTA) 62.5-25 MCG/INH AEPB INHALE ONE DOSE BY MOUTH DAILY   No facility-administered encounter medications on file as of 08/28/2018.     Surgical History: Past Surgical History:  Procedure Laterality Date  . cataract surgery Bilateral   . COLONOSCOPY    . COLONOSCOPY WITH PROPOFOL N/A 01/29/2018   Procedure: COLONOSCOPY WITH PROPOFOL;  Surgeon: Manya Silvas, MD;  Location: [facility] ENDOSCOPY;  Service: Endoscopy;  Laterality: N/A;  . ESOPHAGOGASTRODUODENOSCOPY (EGD) WITH PROPOFOL N/A 01/29/2018   Procedure: ESOPHAGOGASTRODUODENOSCOPY (EGD) WITH PROPOFOL;  Surgeon: Manya Silvas, MD;  Location: [facility]  ENDOSCOPY;  Service: Endoscopy;  Laterality: N/A;  . mamoplasty Bilateral     Medical History: Past Medical History:  Diagnosis Date  . Asthma   . Bronchitis   . Cataracts, bilateral   . Depression   . GERD (gastroesophageal reflux disease)   . Glaucoma   . Hypothyroidism   . Lumbar herniated disc     Family History: Family History  Problem Relation Age of Onset  . Hypertension Mother   . Breast cancer Sister     Social History   Socioeconomic History  . Marital status: Married    Spouse name: Not on file  . Number of children: Not on file  . Years of education: Not on file  . Highest education level: Not on file  Occupational History  . Not on file  Social Needs  . Financial resource strain: Not on file  . Food insecurity:    Worry: Not on file    Inability: Not on file  . Transportation needs:    Medical: Not on file    Non-medical: Not on file  Tobacco Use  . Smoking status: Never Smoker  . Smokeless tobacco: Never Used  Substance and Sexual Activity  . Alcohol use: Yes    Comment: occasionally  . Drug use: No  . Sexual activity: Never  Lifestyle  . Physical activity:    Days per week: Not on file    Minutes per session: Not on file  . Stress: Not on file  Relationships  . Social connections:  Talks on phone: Not on file    Gets together: Not on file    Attends religious service: Not on file    Active member of club or organization: Not on file    Attends meetings of clubs or organizations: Not on file    Relationship status: Not on file  . Intimate partner violence:    Fear of current or ex partner: Not on file    Emotionally abused: Not on file    Physically abused: Not on file    Forced sexual activity: Not on file  Other Topics Concern  . Not on file  Social History Narrative   Lives at home with family   Review of Systems  Constitutional: Negative for chills, diaphoresis and fatigue.  HENT: Negative for ear pain, postnasal drip and  sinus pressure.   Eyes: Negative for photophobia, discharge, redness, itching and visual disturbance.  Respiratory: Positive for chest tightness and shortness of breath. Negative for cough and wheezing.   Cardiovascular: Negative for chest pain, palpitations and leg swelling.  Gastrointestinal: Negative for abdominal pain, constipation, diarrhea, nausea and vomiting.  Genitourinary: Negative for dysuria and flank pain.  Musculoskeletal: Negative for arthralgias, back pain, gait problem and neck pain.  Skin: Negative for color change.  Allergic/Immunologic: Negative for environmental allergies and food allergies.  Neurological: Negative for dizziness and headaches.  Hematological: Does not bruise/bleed easily.  Psychiatric/Behavioral: Negative for agitation, behavioral problems (depression) and hallucinations.   Vital Signs: BP 128/82 (BP Location: Left Arm, Patient Position: Sitting, Cuff Size: Large)   Pulse 70   Resp 16   Ht 5\' 4"  (1.626 m)   Wt 210 lb (95.3 kg)   SpO2 95%   BMI 36.05 kg/m   Physical Exam  Constitutional: She is oriented to person, place, and time. She appears well-developed and well-nourished. No distress.  HENT:  Head: Normocephalic and atraumatic.  Mouth/Throat: Oropharynx is clear and moist. No oropharyngeal exudate.  Eyes: Pupils are equal, round, and reactive to light. EOM are normal.  Neck: Normal range of motion. Neck supple. No JVD present. No tracheal deviation present. No thyromegaly present.  Cardiovascular: Normal rate, regular rhythm and normal heart sounds. Exam reveals no gallop and no friction rub.  No murmur heard. Pulmonary/Chest: Effort normal. No respiratory distress. She has no wheezes. She has no rales. She exhibits no tenderness.  Abdominal: Soft. Bowel sounds are normal.  Musculoskeletal: Normal range of motion.  Lymphadenopathy:    She has no cervical adenopathy.  Neurological: She is alert and oriented to person, place, and time. No  cranial nerve deficit.  Skin: Skin is warm and dry. She is not diaphoretic.  Psychiatric: She has a normal mood and affect. Her behavior is normal. Judgment and thought content normal.   Assessment/Plan: 1. SOB (shortness of breath) on exertion She continues to have dyspnea, has been using her inhalers. Concerned about angina equivalent or other lung pathology like fibrosis - Ambulatory referral to Cardiology  2. Positive ANA (antinuclear antibody) Has abnormal ANA, Needs Ambulatory referral to Rheumatology  3. GERD without esophagitis She needs to be consistent with her meds  4. Hypothyroidism, unspecified type Continue Tirosint as before   5. Mixed hyperlipidemia Diet and life style changes, does not want to take statins   General Counseling: Kela Millin understanding of the findings of todays visit and agrees with plan of treatment. I have discussed any further diagnostic evaluation that may be needed or ordered today. We also reviewed her medications today.  she has been encouraged to call the office with any questions or concerns that should arise related to todays visit.   Orders Placed This Encounter  Procedures  . Ambulatory referral to Rheumatology  . Ambulatory referral to Cardiology    Time spent:25 Minutes  Dr Lavera Guise Internal medicine

## 2018-08-29 ENCOUNTER — Other Ambulatory Visit: Payer: Self-pay

## 2018-08-29 DIAGNOSIS — R0602 Shortness of breath: Secondary | ICD-10-CM

## 2018-08-29 DIAGNOSIS — M9905 Segmental and somatic dysfunction of pelvic region: Secondary | ICD-10-CM | POA: Diagnosis not present

## 2018-08-29 DIAGNOSIS — M5033 Other cervical disc degeneration, cervicothoracic region: Secondary | ICD-10-CM | POA: Diagnosis not present

## 2018-08-29 DIAGNOSIS — M9901 Segmental and somatic dysfunction of cervical region: Secondary | ICD-10-CM | POA: Diagnosis not present

## 2018-08-29 DIAGNOSIS — M955 Acquired deformity of pelvis: Secondary | ICD-10-CM | POA: Diagnosis not present

## 2018-08-29 LAB — TSH+FREE T4
Free T4: 1.39 ng/dL (ref 0.82–1.77)
TSH: 1.24 u[IU]/mL (ref 0.450–4.500)

## 2018-08-29 LAB — COMPREHENSIVE METABOLIC PANEL
A/G RATIO: 1.8 (ref 1.2–2.2)
ALBUMIN: 4.2 g/dL (ref 3.6–4.8)
ALT: 20 IU/L (ref 0–32)
AST: 17 IU/L (ref 0–40)
Alkaline Phosphatase: 73 IU/L (ref 39–117)
BUN / CREAT RATIO: 20 (ref 12–28)
BUN: 16 mg/dL (ref 8–27)
Bilirubin Total: 0.5 mg/dL (ref 0.0–1.2)
CHLORIDE: 103 mmol/L (ref 96–106)
CO2: 23 mmol/L (ref 20–29)
Calcium: 9 mg/dL (ref 8.7–10.3)
Creatinine, Ser: 0.82 mg/dL (ref 0.57–1.00)
GFR calc Af Amer: 87 mL/min/{1.73_m2} (ref >59)
GFR calc non Af Amer: 75 mL/min/{1.73_m2} (ref >59)
Globulin, Total: 2.4 g/dL (ref 1.5–4.5)
Glucose: 91 mg/dL (ref 65–99)
POTASSIUM: 4.3 mmol/L (ref 3.5–5.2)
Sodium: 142 mmol/L (ref 134–144)
TOTAL PROTEIN: 6.6 g/dL (ref 6.0–8.5)

## 2018-08-29 LAB — ANA W/REFLEX IF POSITIVE
Anti JO-1: <0.2 AI (ref 0.0–0.9)
Anti Nuclear Antibody(ANA): POSITIVE — AB
Centromere Ab Screen: <0.2 AI (ref 0.0–0.9)
Chromatin Ab SerPl-aCnc: <0.2 AI (ref 0.0–0.9)
DSDNA AB: 1 [IU]/mL (ref 0–9)
ENA RNP Ab: 5.2 AI — ABNORMAL HIGH (ref 0.0–0.9)
ENA SSA (RO) Ab: <0.2 AI (ref 0.0–0.9)
ENA SSB (LA) Ab: <0.2 AI (ref 0.0–0.9)

## 2018-08-29 LAB — CBC WITH DIFFERENTIAL/PLATELET
Basophils Absolute: 0.1 10*3/uL (ref 0.0–0.2)
Basos: 1 %
EOS (ABSOLUTE): 0.1 10*3/uL (ref 0.0–0.4)
EOS: 2 %
Hematocrit: 41.8 % (ref 34.0–46.6)
Hemoglobin: 13.8 g/dL (ref 11.1–15.9)
IMMATURE GRANULOCYTES: 0 %
Immature Grans (Abs): 0 10*3/uL (ref 0.0–0.1)
Lymphocytes Absolute: 2 10*3/uL (ref 0.7–3.1)
Lymphs: 32 %
MCH: 30.1 pg (ref 26.6–33.0)
MCHC: 33 g/dL (ref 31.5–35.7)
MCV: 91 fL (ref 79–97)
MONOS ABS: 0.5 10*3/uL (ref 0.1–0.9)
Monocytes: 9 %
NEUTROS PCT: 56 %
Neutrophils Absolute: 3.5 10*3/uL (ref 1.4–7.0)
PLATELETS: 218 10*3/uL (ref 150–450)
RBC: 4.59 x10E6/uL (ref 3.77–5.28)
RDW: 12.3 % (ref 12.3–15.4)
WBC: 6.1 10*3/uL (ref 3.4–10.8)

## 2018-08-29 LAB — HCV COMMENT:

## 2018-08-29 LAB — LIPID PANEL WITH LDL/HDL RATIO
Cholesterol, Total: 247 mg/dL — ABNORMAL HIGH (ref 100–199)
HDL: 60 mg/dL (ref >39)
LDL Calculated: 154 mg/dL — ABNORMAL HIGH (ref 0–99)
LDl/HDL Ratio: 2.6 ratio (ref 0.0–3.2)
TRIGLYCERIDES: 164 mg/dL — AB (ref 0–149)
VLDL Cholesterol Cal: 33 mg/dL (ref 5–40)

## 2018-08-29 LAB — VITAMIN D 1,25 DIHYDROXY
Vitamin D 1, 25 (OH)2 Total: 35 pg/mL
Vitamin D2 1, 25 (OH)2: <10 pg/mL
Vitamin D3 1, 25 (OH)2: 34 pg/mL

## 2018-08-29 LAB — IRON,TIBC AND FERRITIN PANEL
FERRITIN: 36 ng/mL (ref 15–150)
IRON: 83 ug/dL (ref 27–139)
Iron Saturation: 22 % (ref 15–55)
Total Iron Binding Capacity: 371 ug/dL (ref 250–450)
UIBC: 288 ug/dL (ref 118–369)

## 2018-08-29 LAB — B12 AND FOLATE PANEL
Folate: >20 ng/mL (ref >3.0)
Vitamin B-12: 505 pg/mL (ref 232–1245)

## 2018-08-29 LAB — SEDIMENTATION RATE: Sed Rate: 12 mm/hr (ref 0–40)

## 2018-08-29 LAB — HIV ANTIBODY (ROUTINE TESTING W REFLEX): HIV Screen 4th Generation wRfx: NONREACTIVE

## 2018-08-29 LAB — HEPATITIS C ANTIBODY (REFLEX): HCV AB: 0.1 {s_co_ratio} (ref 0.0–0.9)

## 2018-08-29 MED ORDER — UMECLIDINIUM-VILANTEROL 62.5-25 MCG/INH IN AEPB: INHALATION_SPRAY | RESPIRATORY_TRACT | 2 refills | Status: DC

## 2018-08-30 DIAGNOSIS — T8579XA Infection and inflammatory reaction due to other internal prosthetic devices, implants and grafts, initial encounter: Secondary | ICD-10-CM | POA: Diagnosis not present

## 2018-09-04 ENCOUNTER — Ambulatory Visit: Payer: Medicare HMO

## 2018-09-04 DIAGNOSIS — H401131 Primary open-angle glaucoma, bilateral, mild stage: Secondary | ICD-10-CM | POA: Diagnosis not present

## 2018-09-07 ENCOUNTER — Ambulatory Visit
Admission: RE | Admit: 2018-09-07 | Discharge: 2018-09-07 | Disposition: A | Discharge status: Home / Self Care | Payer: 59 | Source: Ambulatory Visit | Attending: Internal Medicine | Admitting: Internal Medicine

## 2018-09-07 DIAGNOSIS — K219 Gastro-esophageal reflux disease without esophagitis: Secondary | ICD-10-CM | POA: Diagnosis present

## 2018-09-07 DIAGNOSIS — R0602 Shortness of breath: Secondary | ICD-10-CM | POA: Insufficient documentation

## 2018-09-07 DIAGNOSIS — J45909 Unspecified asthma, uncomplicated: Secondary | ICD-10-CM | POA: Insufficient documentation

## 2018-09-12 DIAGNOSIS — M5033 Other cervical disc degeneration, cervicothoracic region: Secondary | ICD-10-CM | POA: Diagnosis not present

## 2018-09-12 DIAGNOSIS — M9905 Segmental and somatic dysfunction of pelvic region: Secondary | ICD-10-CM | POA: Diagnosis not present

## 2018-09-12 DIAGNOSIS — M955 Acquired deformity of pelvis: Secondary | ICD-10-CM | POA: Diagnosis not present

## 2018-09-12 DIAGNOSIS — M9901 Segmental and somatic dysfunction of cervical region: Secondary | ICD-10-CM | POA: Diagnosis not present

## 2018-09-14 ENCOUNTER — Telehealth: Payer: Self-pay

## 2018-09-14 ENCOUNTER — Other Ambulatory Visit: Payer: Self-pay | Admitting: Adult Health

## 2018-09-14 DIAGNOSIS — Z9109 Other allergy status, other than to drugs and biological substances: Secondary | ICD-10-CM | POA: Diagnosis not present

## 2018-09-14 DIAGNOSIS — R0602 Shortness of breath: Secondary | ICD-10-CM | POA: Diagnosis not present

## 2018-09-14 DIAGNOSIS — K219 Gastro-esophageal reflux disease without esophagitis: Secondary | ICD-10-CM | POA: Diagnosis not present

## 2018-09-14 DIAGNOSIS — E039 Hypothyroidism, unspecified: Secondary | ICD-10-CM | POA: Diagnosis not present

## 2018-09-14 DIAGNOSIS — R0789 Other chest pain: Secondary | ICD-10-CM | POA: Diagnosis not present

## 2018-09-14 DIAGNOSIS — I340 Nonrheumatic mitral (valve) insufficiency: Secondary | ICD-10-CM | POA: Diagnosis not present

## 2018-09-14 DIAGNOSIS — Z6836 Body mass index (BMI) 36.0-36.9, adult: Secondary | ICD-10-CM | POA: Diagnosis not present

## 2018-09-14 DIAGNOSIS — E7800 Pure hypercholesterolemia, unspecified: Secondary | ICD-10-CM | POA: Diagnosis not present

## 2018-09-14 DIAGNOSIS — I517 Cardiomegaly: Secondary | ICD-10-CM | POA: Diagnosis not present

## 2018-09-14 NOTE — Telephone Encounter (Signed)
Pt advised as per adam ct for chest is normal

## 2018-09-17 ENCOUNTER — Ambulatory Visit: Payer: 59 | Admitting: Family Medicine

## 2018-09-17 ENCOUNTER — Encounter: Payer: Self-pay | Admitting: Family Medicine

## 2018-09-17 ENCOUNTER — Ambulatory Visit (INDEPENDENT_AMBULATORY_CARE_PROVIDER_SITE_OTHER): Payer: 59 | Admitting: Family Medicine

## 2018-09-17 VITALS — BP 126/70 | HR 72 | Temp 98.8°F | Ht 62.5 in | Wt 212.8 lb

## 2018-09-17 DIAGNOSIS — E669 Obesity, unspecified: Secondary | ICD-10-CM

## 2018-09-17 DIAGNOSIS — H401131 Primary open-angle glaucoma, bilateral, mild stage: Secondary | ICD-10-CM

## 2018-09-17 DIAGNOSIS — J449 Chronic obstructive pulmonary disease, unspecified: Secondary | ICD-10-CM | POA: Diagnosis not present

## 2018-09-17 DIAGNOSIS — R768 Other specified abnormal immunological findings in serum: Secondary | ICD-10-CM | POA: Diagnosis not present

## 2018-09-17 MED ORDER — ALBUTEROL SULFATE HFA 108 (90 BASE) MCG/ACT IN AERS: 2.0000 | INHALATION_SPRAY | Freq: Four times a day (QID) | RESPIRATORY_TRACT | 5 refills | Status: DC | PRN

## 2018-09-17 NOTE — Progress Notes (Signed)
Subjective:    Patient ID: Karla Turner, female    DOB: 1951/08/30, 67 y.o.   MRN: [id number]  HPI  Presents to clinic to establish care with PCP.  Her main concern today is getting a referral to rheumatology due to having a positive ANA blood test.  Patient's ANA was tested due to inflammatory issues with her eye.    Patient currently seeing Dr. Lyndel Safe at [facility] for primary open angle glaucoma.  Patient is on a new eyedrop, so far this is been going well.  Her previous eyedrops seem to make her eye red, and did not help much with any of her glaucoma symptoms.  Patient also thinks that the multiple different eyedrops she had been on previously contributed to worsening breathing issues.  Patient has diagnosis of COPD.  She sees pulmonology, Dr. Chancy Milroy.  She uses albuterol inhaler as needed, also has an oral that she takes daily for stabilization of COPD.  Patient was also referred to cardiology by her pulmonologist, she saw them yesterday had echocardiogram done.  Per patient report her heart is functioning well.  Patient is also concerned with her weight.  Is trying to work on weight loss.  Recent lab work from 08/2018 reviewed by me.    Past medical history, social history, surgical history, family history reviewed and updated in chart: Past Medical History:  Diagnosis Date  . Asthma   . Bronchitis   . Cataracts, bilateral   . Depression   . GERD (gastroesophageal reflux disease)   . Glaucoma   . Hypothyroidism   . Lumbar herniated disc    Social History   Tobacco Use  . Smoking status: Never Smoker  . Smokeless tobacco: Never Used  Substance Use Topics  . Alcohol use: Yes    Comment: occasionally   Past Surgical History:  Procedure Laterality Date  . cataract surgery Bilateral   . COLONOSCOPY    . COLONOSCOPY WITH PROPOFOL N/A 01/29/2018   Procedure: COLONOSCOPY WITH PROPOFOL;  Surgeon: Manya Silvas, MD;  Location: [facility] ENDOSCOPY;  Service: Endoscopy;  Laterality:  N/A;  . ESOPHAGOGASTRODUODENOSCOPY (EGD) WITH PROPOFOL N/A 01/29/2018   Procedure: ESOPHAGOGASTRODUODENOSCOPY (EGD) WITH PROPOFOL;  Surgeon: Manya Silvas, MD;  Location: [facility] ENDOSCOPY;  Service: Endoscopy;  Laterality: N/A;  . mamoplasty Bilateral    Family History  Problem Relation Age of Onset  . Hypertension Mother   . Breast cancer Sister     Review of Systems  Constitutional: Negative for chills, fatigue and fever.  HENT: Negative for congestion, ear pain, sinus pain and sore throat.   Eyes: Negative.   Respiratory: Negative for cough, shortness of breath and wheezing.   Cardiovascular: Negative for chest pain, palpitations and leg swelling.  Gastrointestinal: Negative for abdominal pain, diarrhea, nausea and vomiting.  Genitourinary: Negative for dysuria, frequency and urgency.  Musculoskeletal: Negative for arthralgias and myalgias.  Skin: Negative for color change, pallor and rash.  Neurological: Negative for syncope, light-headedness and headaches.  Psychiatric/Behavioral: The patient is not nervous/anxious.    Objective:   Physical Exam  Constitutional:  She appears well-developed and well-nourished. No distress.  Head: Normocephalic and atraumatic.  Eyes: Pupils are equal, round, and reactive to light. EOM are normal. No scleral icterus.  Neck: Normal range of motion. Neck supple. No tracheal deviation present.  Cardiovascular: Normal rate, regular rhythm and normal heart sounds.  Pulmonary/Chest: Effort normal and breath sounds normal. No respiratory distress. She has no wheezes. She has no rales.  Abdominal:  Soft. Bowel sounds are normal. There is no tenderness.  Neurological: She is alert and oriented to person, place, and time.  Gait normal  Skin: Skin is warm and dry. No pallor.  Psychiatric: She has a normal mood and affect. Her behavior is normal. Thought content normal.   Nursing note and vitals reviewed.    Vitals:   09/17/18 0847  BP: 126/70    Pulse: 72  Temp: 98.8 F (37.1 C)  SpO2: 95%   Body mass index is 38.3 kg/m.  Assessment & Plan:   COPD-patient will continue Anoro.  Refill of albuterol inhaler given to use as needed.  Positive ANA-due to the positive ANA and most recent lab work done in October 2019, we will do rheumatology referral for patient for further investigation.  Primary open angle glaucoma-patient will continue following with ophthalmologist Dr. Unk Lightning at [facility]  Obesity- Long discussion about calorie counting.  Discussed patient downloading the app called lose it, this Is free and helps you log your calories.  Calorie counting really makes you aware of how much you are eating and how many calories are in the foods and drink as well.  Also discussed that weight loss is a marathon rather than something that happens quickly overnight.  The best way to initiate weight loss and also maintain weight loss is to eat better food choices, get regular exercise.   Patient will follow-up in 3 months for recheck on chronic conditions.

## 2018-09-18 DIAGNOSIS — H4051X1 Glaucoma secondary to other eye disorders, right eye, mild stage: Secondary | ICD-10-CM | POA: Diagnosis not present

## 2018-09-19 ENCOUNTER — Telehealth: Payer: Self-pay

## 2018-09-19 DIAGNOSIS — M955 Acquired deformity of pelvis: Secondary | ICD-10-CM | POA: Diagnosis not present

## 2018-09-19 DIAGNOSIS — M5033 Other cervical disc degeneration, cervicothoracic region: Secondary | ICD-10-CM | POA: Diagnosis not present

## 2018-09-19 DIAGNOSIS — M9905 Segmental and somatic dysfunction of pelvic region: Secondary | ICD-10-CM | POA: Diagnosis not present

## 2018-09-19 DIAGNOSIS — M9901 Segmental and somatic dysfunction of cervical region: Secondary | ICD-10-CM | POA: Diagnosis not present

## 2018-09-19 NOTE — Telephone Encounter (Signed)
-----   Message from Lavera Guise, MD sent at 09/19/2018 10:44 AM EST ----- Please notify patient Karla Turner is normal

## 2018-09-19 NOTE — Telephone Encounter (Signed)
Called pt and informed her of her CT results.

## 2018-09-26 ENCOUNTER — Telehealth: Payer: Self-pay | Admitting: Internal Medicine

## 2018-09-26 DIAGNOSIS — M5033 Other cervical disc degeneration, cervicothoracic region: Secondary | ICD-10-CM | POA: Diagnosis not present

## 2018-09-26 DIAGNOSIS — M9905 Segmental and somatic dysfunction of pelvic region: Secondary | ICD-10-CM | POA: Diagnosis not present

## 2018-09-26 DIAGNOSIS — M9901 Segmental and somatic dysfunction of cervical region: Secondary | ICD-10-CM | POA: Diagnosis not present

## 2018-09-26 DIAGNOSIS — M955 Acquired deformity of pelvis: Secondary | ICD-10-CM | POA: Diagnosis not present

## 2018-09-26 NOTE — Telephone Encounter (Signed)
Left message for patient regarding being able to see her results in my chart, pt to call back with any questions

## 2018-10-02 DIAGNOSIS — H4051X1 Glaucoma secondary to other eye disorders, right eye, mild stage: Secondary | ICD-10-CM | POA: Diagnosis not present

## 2018-10-03 DIAGNOSIS — M9905 Segmental and somatic dysfunction of pelvic region: Secondary | ICD-10-CM | POA: Diagnosis not present

## 2018-10-03 DIAGNOSIS — M9901 Segmental and somatic dysfunction of cervical region: Secondary | ICD-10-CM | POA: Diagnosis not present

## 2018-10-03 DIAGNOSIS — M5033 Other cervical disc degeneration, cervicothoracic region: Secondary | ICD-10-CM | POA: Diagnosis not present

## 2018-10-03 DIAGNOSIS — M955 Acquired deformity of pelvis: Secondary | ICD-10-CM | POA: Diagnosis not present

## 2018-10-03 NOTE — Progress Notes (Signed)
Office Visit Note  Patient: Karla Turner             Date of Birth: 22-May-1951           MRN: [id number]             PCP: Lavera Guise, MD Referring: Lavera Guise, MD Visit Date: 10/16/2018 Occupation: Business owner  Subjective:  Shortness of breath and positive RNP.   History of Present Illness: Karla Turner is a 67 y.o. female seen in consultation per request of her PCP.  According to patient in July 2018 while she was in [place] she was in a warmer room and had an asthma attack.  She states she started using inhalers and since then she had been using inhalers.  She believes that over time her shortness of breath has been getting worse.  She had a chest x-ray last year which was normal.  This year she was seen by her PCP and had a CT chest without contrast which was within normal limits.  She also had labs done which was positive for ANA and RNP.  Patient reports that she has been coughing.  She does have history of gastroesophageal reflux.  She denies any history of joint swelling.  She describes some discomfort in her bilateral CMC joints with activities only.  She states she has had lower back pain off and on for several years for which she sees chiropractor.  She is also had inflammation for which she used prednisone eyedrops.  Activities of Daily Living:  Patient reports morning stiffness for 5 minutes.   Patient Denies nocturnal pain.  Difficulty dressing/grooming: Denies Difficulty climbing stairs: Reports shortness of breath Difficulty getting out of chair: Denies Difficulty using hands for taps, buttons, cutlery, and/or writing: Denies  Review of Systems  Constitutional: Positive for fatigue. Negative for night sweats, weight gain and weight loss.  HENT: Negative for mouth sores, trouble swallowing, trouble swallowing, mouth dryness and nose dryness.   Eyes: Negative for pain, redness, visual disturbance and dryness.  Respiratory: Positive for cough and shortness of  breath. Negative for difficulty breathing.   Cardiovascular: Negative for chest pain, palpitations, hypertension, irregular heartbeat and swelling in legs/feet.  Gastrointestinal: Positive for heartburn. Negative for blood in stool, constipation and diarrhea.  Endocrine: Negative for increased urination.  Genitourinary: Negative for vaginal dryness.  Musculoskeletal: Positive for arthralgias, joint pain, myalgias, morning stiffness and myalgias. Negative for joint swelling, muscle weakness and muscle tenderness.  Skin: Positive for sensitivity to sunlight. Negative for color change, rash, hair loss, skin tightness and ulcers.  Allergic/Immunologic: Negative for susceptible to infections.  Neurological: Negative for dizziness, memory loss, night sweats and weakness.  Hematological: Negative for swollen glands.  Psychiatric/Behavioral: Positive for depressed mood. Negative for sleep disturbance. The patient is not nervous/anxious.     PMFS History:  Patient Active Problem List   Diagnosis Date Noted  . Primary open angle glaucoma (POAG) of both eyes, mild stage 09/17/2018  . Positive ANA (antinuclear antibody) 09/17/2018  . Colon polyp 12/18/2017  . Chronic obstructive pulmonary disease (COPD) ([place]) 12/18/2017  . Shortness of breath 12/18/2017  . Vitamin B12 deficiency anemia 12/18/2017  . Asthma 11/17/2017  . GERD (gastroesophageal reflux disease) 11/17/2017  . Hx of adenomatous colonic polyps 11/17/2017  . Obesity (BMI 35.0-39.9 without comorbidity) 11/17/2017  . Pelvic mass in female 01/10/2017  . Varicose veins 07/24/2015  . Glaucoma suspect of right eye 04/15/2014  . Infection and inflammatory reaction  due to other internal prosthetic devices, implants and grafts, initial encounter ([place]) 04/15/2014  . Pseudophakia of both eyes 04/15/2014  . Cystoid macular edema 03/18/2013  . Epiretinal membrane 03/18/2013    Past Medical History:  Diagnosis Date  . Asthma   . Bronchitis   .  Cataracts, bilateral   . Colon polyps   . Depression   . Diverticulitis   . GERD (gastroesophageal reflux disease)   . Glaucoma   . Hyperlipidemia   . Hypothyroidism   . Lumbar herniated disc     Family History  Problem Relation Age of Onset  . Breast cancer Sister   . Fibromyalgia Sister   . Allergies Son   . Asthma Son        as a child    Past Surgical History:  Procedure Laterality Date  . BIOPSY BREAST  2019  . cataract surgery Bilateral   . COLONOSCOPY    . COLONOSCOPY WITH PROPOFOL N/A 01/29/2018   Procedure: COLONOSCOPY WITH PROPOFOL;  Surgeon: Manya Silvas, MD;  Location: [facility] ENDOSCOPY;  Service: Endoscopy;  Laterality: N/A;  . DILATION AND CURETTAGE OF UTERUS    . ESOPHAGOGASTRODUODENOSCOPY (EGD) WITH PROPOFOL N/A 01/29/2018   Procedure: ESOPHAGOGASTRODUODENOSCOPY (EGD) WITH PROPOFOL;  Surgeon: Manya Silvas, MD;  Location: [facility] ENDOSCOPY;  Service: Endoscopy;  Laterality: N/A;  . mamoplasty Bilateral    Social History   Social History Narrative   Lives at home with family    Objective: Vital Signs: BP 121/75 (BP Location: Right Arm, Patient Position: Sitting, Cuff Size: Normal)   Pulse 68   Resp 14   Ht 5\' 3"  (1.6 m)   Wt 216 lb (98 kg)   BMI 38.26 kg/m    Physical Exam  Constitutional: She is oriented to person, place, and time. She appears well-developed and well-nourished.  HENT:  Head: Normocephalic and atraumatic.  Eyes: Conjunctivae and EOM are normal.  Mild conjunctival injection was noted.  Neck: Normal range of motion.  Cardiovascular: Normal rate, regular rhythm, normal heart sounds and intact distal pulses.  Pulmonary/Chest: Effort normal and breath sounds normal.  Abdominal: Soft. Bowel sounds are normal.  Lymphadenopathy:    She has no cervical adenopathy.  Neurological: She is alert and oriented to person, place, and time.  Skin: Skin is warm and dry. Capillary refill takes less than 2 seconds.  Psychiatric: She has a normal  mood and affect. Her behavior is normal.  Nursing note and vitals reviewed.    Musculoskeletal Exam: C-spine thoracic lumbar spine good range of motion.  Shoulder joints elbow joints wrist joint MCPs PIPs DIPs been good range of motion.  She has bilateral PIP DIP and CMC thickening.  Shoulder joints elbow joints wrist joint MCPs PIPs DIPs been good range of motion with no synovitis.  CDAI Exam: CDAI Score: Not documented Patient Global Assessment: Not documented; Provider Global Assessment: Not documented Swollen: Not documented; Tender: Not documented Joint Exam   Not documented   There is currently no information documented on the homunculus. Go to the Rheumatology activity and complete the homunculus joint exam.  Investigation: Findings:  08/24/18: Sed rate 12, ANA +, dsDNA 1, RNP 5.2, smith-, Scl-70 negative, Ro-, La-, anti-jo 1 negative, HIV-, HCV ab 0.1, T4 1.39, TSH 1.240, Folate >20, B12 505, Vitamin D 34, ferritin 36, iron sat 22%, iron 83, TIBC 371  Component     Latest Ref Rng & Units 08/24/2018  Anti Nuclear Antibody(ANA)     Negative Positive (A)  dsDNA Ab     0 - 9 IU/mL 1  ENA RNP Ab     0.0 - 0.9 AI 5.2 (H)  ENA SM Ab Ser-aCnc     0.0 - 0.9 AI <0.2  Scleroderma SCL-70     0.0 - 0.9 AI <0.2  ENA SSA (RO) Ab     0.0 - 0.9 AI <0.2  ENA SSB (LA) Ab     0.0 - 0.9 AI <0.2  Chromatin Ab SerPl-aCnc     0.0 - 0.9 AI <0.2  Anti JO-1     0.0 - 0.9 AI <0.2  CENTROMERE AB SCREEN     0.0 - 0.9 AI <0.2  SEE BELOW      Comment  TIBC     250 - 450 ug/dL 371  UIBC     118 - 369 ug/dL 288  Iron     27 - 139 ug/dL 83  Iron Saturation     15 - 55 % 22  Ferritin     15 - 150 ng/mL 36  Vitamin D 1, 25 (OH) Total     pg/mL 35  Vitamin D2 1, 25 (OH)     pg/mL <10  Vitamin D3 1, 25 (OH)     pg/mL 34  Vitamin B12     232 - 1,245 pg/mL 505  Folate     >3.0 ng/mL >20.0  TSH     0.450 - 4.500 uIU/mL 1.240  T4,Free(Direct)     0.82 - 1.77 ng/dL 1.39  HCV Ab      0.0 - 0.9 s/co ratio 0.1  HIV Screen 4th Generation wRfx     Non Reactive Non Reactive  Comment:      Comment  Sed Rate     0 - 40 mm/hr 12   Imaging: No results found.  Recent Labs: Lab Results  Component Value Date   WBC 6.1 08/24/2018   HGB 13.8 08/24/2018   PLT 218 08/24/2018   NA 142 08/24/2018   K 4.3 08/24/2018   CL 103 08/24/2018   CO2 23 08/24/2018   GLUCOSE 91 08/24/2018   BUN 16 08/24/2018   CREATININE 0.82 08/24/2018   BILITOT 0.5 08/24/2018   ALKPHOS 73 08/24/2018   AST 17 08/24/2018   ALT 20 08/24/2018   PROT 6.6 08/24/2018   ALBUMIN 4.2 08/24/2018   CALCIUM 9.0 08/24/2018   GFRAA 87 08/24/2018  June 21, 2017 chest x-ray normal, September 07, 2018 CT chest without contrast negative  Speciality Comments: No specialty comments available.  Procedures:  No procedures performed Allergies: White petrolatum   Assessment / Plan:     Visit Diagnoses: Positive ANA (antinuclear antibody) -patient has positive RNP although she does not have any clinical features of autoimmune disease on examination except shortness of breath which she has been complaining for the last 1 year.  She denies any history of rainouts or synovitis.  I am also concerned about eye inflammation for which she is used prednisone eyedrops.  I would like to get information from her ophthalmologist.  I will obtainAVISE labs.  08/24/18: Sed rate 12, ANA +, dsDNA 1, RNP 5.2, smith-, Scl-70 negative, Ro-, La-, anti-jo 1 negative, HIV-, HCV ab 0.1, T4 1.39, TSH 1.240, Folate >20, B12 505  Primary osteoarthritis of both hands-her clinical findings are consistent with osteoarthritis of her hands.  History of COPD-the CT scan of the chest was negative.  Chest x-ray was normal.  I am uncertain if the shortness  of breath is coming from any underlying autoimmune process over her COPD and reflux.  Patient would like a second opinion from a pulmonologist.  Per her request I will refer her to [facility].  History of hyperlipidemia  History of gastroesophageal reflux (GERD)  History of diverticulitis  Hx of adenomatous colonic polyps  Primary open angle glaucoma (POAG) of both eyes, mild stage-I have advised her to forward notes from her ophthalmologist for review to see if she has any evidence of uveitis or scleritis.  Pseudophakia of both eyes  History of hypothyroidism  History of depression   Orders: Orders Placed This Encounter  Procedures  . Ambulatory referral to Pulmonology   No orders of the defined types were placed in this encounter.   Face-to-face time spent with patient was 60 minutes. Greater than 50% of time was spent in counseling and coordination of care.  Patient is going to [place] next week and will not be back until January 18.  Follow-Up Instructions: Return for Positive RNP, shortness of breath.   Bo Merino, MD  Note - This record has been created using Editor, commissioning.  Chart creation errors have been sought, but may not always  have been located. Such creation errors do not reflect on  the standard of medical care.

## 2018-10-10 DIAGNOSIS — M955 Acquired deformity of pelvis: Secondary | ICD-10-CM | POA: Diagnosis not present

## 2018-10-10 DIAGNOSIS — M9905 Segmental and somatic dysfunction of pelvic region: Secondary | ICD-10-CM | POA: Diagnosis not present

## 2018-10-10 DIAGNOSIS — M5033 Other cervical disc degeneration, cervicothoracic region: Secondary | ICD-10-CM | POA: Diagnosis not present

## 2018-10-10 DIAGNOSIS — M9901 Segmental and somatic dysfunction of cervical region: Secondary | ICD-10-CM | POA: Diagnosis not present

## 2018-10-16 ENCOUNTER — Encounter: Payer: Self-pay | Admitting: Rheumatology

## 2018-10-16 ENCOUNTER — Encounter (INDEPENDENT_AMBULATORY_CARE_PROVIDER_SITE_OTHER): Payer: Self-pay

## 2018-10-16 ENCOUNTER — Ambulatory Visit (INDEPENDENT_AMBULATORY_CARE_PROVIDER_SITE_OTHER): Payer: 59 | Admitting: Rheumatology

## 2018-10-16 VITALS — BP 121/75 | HR 68 | Resp 14 | Ht 63.0 in | Wt 216.0 lb

## 2018-10-16 DIAGNOSIS — R0602 Shortness of breath: Secondary | ICD-10-CM | POA: Diagnosis not present

## 2018-10-16 DIAGNOSIS — Z8719 Personal history of other diseases of the digestive system: Secondary | ICD-10-CM

## 2018-10-16 DIAGNOSIS — H401131 Primary open-angle glaucoma, bilateral, mild stage: Secondary | ICD-10-CM | POA: Diagnosis not present

## 2018-10-16 DIAGNOSIS — Z961 Presence of intraocular lens: Secondary | ICD-10-CM

## 2018-10-16 DIAGNOSIS — M19041 Primary osteoarthritis, right hand: Secondary | ICD-10-CM

## 2018-10-16 DIAGNOSIS — R7689 Other specified abnormal immunological findings in serum: Secondary | ICD-10-CM

## 2018-10-16 DIAGNOSIS — Z8709 Personal history of other diseases of the respiratory system: Secondary | ICD-10-CM | POA: Diagnosis not present

## 2018-10-16 DIAGNOSIS — Z8639 Personal history of other endocrine, nutritional and metabolic disease: Secondary | ICD-10-CM | POA: Diagnosis not present

## 2018-10-16 DIAGNOSIS — M19042 Primary osteoarthritis, left hand: Secondary | ICD-10-CM

## 2018-10-16 DIAGNOSIS — R69 Illness, unspecified: Secondary | ICD-10-CM | POA: Diagnosis not present

## 2018-10-16 DIAGNOSIS — D8989 Other specified disorders involving the immune mechanism, not elsewhere classified: Secondary | ICD-10-CM | POA: Diagnosis not present

## 2018-10-16 DIAGNOSIS — Z860101 Personal history of adenomatous and serrated colon polyps: Secondary | ICD-10-CM

## 2018-10-16 DIAGNOSIS — Z86010 Personal history of colonic polyps: Secondary | ICD-10-CM | POA: Diagnosis not present

## 2018-10-16 DIAGNOSIS — R768 Other specified abnormal immunological findings in serum: Secondary | ICD-10-CM

## 2018-10-16 DIAGNOSIS — Z8659 Personal history of other mental and behavioral disorders: Secondary | ICD-10-CM

## 2018-10-17 DIAGNOSIS — M5033 Other cervical disc degeneration, cervicothoracic region: Secondary | ICD-10-CM | POA: Diagnosis not present

## 2018-10-17 DIAGNOSIS — M955 Acquired deformity of pelvis: Secondary | ICD-10-CM | POA: Diagnosis not present

## 2018-10-17 DIAGNOSIS — M9905 Segmental and somatic dysfunction of pelvic region: Secondary | ICD-10-CM | POA: Diagnosis not present

## 2018-10-17 DIAGNOSIS — M9901 Segmental and somatic dysfunction of cervical region: Secondary | ICD-10-CM | POA: Diagnosis not present

## 2018-10-22 ENCOUNTER — Encounter: Payer: Self-pay | Admitting: Nurse Practitioner

## 2018-11-16 DIAGNOSIS — E785 Hyperlipidemia, unspecified: Secondary | ICD-10-CM | POA: Insufficient documentation

## 2018-11-16 DIAGNOSIS — Z8719 Personal history of other diseases of the digestive system: Secondary | ICD-10-CM | POA: Insufficient documentation

## 2018-11-16 NOTE — Progress Notes (Signed)
Office Visit Note  Patient: Karla Turner             Date of Birth: [birth year]           MRN: [id number]             PCP: Lavera Guise, MD Referring: Jodelle Green, FNP Visit Date: 11/27/2018 Occupation: @GUAROCC @  Subjective:  SOB.   History of Present Illness: Karla Turner is a 68 y.o. female with history of osteoarthritis and +ANA.  According to patient mid December she developed cough.  She went to [place] on a trip where her cough and shortness of breath got worse.  She was seen at the emergency room there and was placed on inhalers and prednisone.  She states the symptoms have gradually improved but she still continues to have some shortness of breath.  She has some discomfort in her thumbs.  But currently not having much pain.  She denies any joint swelling.  Activities of Daily Living:  Patient reports morning stiffness for 5 minutes.   Patient Denies nocturnal pain.  Difficulty dressing/grooming: Denies Difficulty climbing stairs: Reports Difficulty getting out of chair: Reports Difficulty using hands for taps, buttons, cutlery, and/or writing: Denies  Review of Systems  Constitutional: Positive for fatigue. Negative for night sweats, weight gain and weight loss.  HENT: Negative for mouth sores, trouble swallowing, trouble swallowing, mouth dryness and nose dryness.   Eyes: Positive for dryness. Negative for pain, redness and visual disturbance.  Respiratory: Positive for shortness of breath. Negative for cough and difficulty breathing.   Cardiovascular: Negative for chest pain, palpitations, hypertension, irregular heartbeat and swelling in legs/feet.  Gastrointestinal: Negative for blood in stool, constipation and diarrhea.  Endocrine: Negative for increased urination.  Genitourinary: Negative for vaginal dryness.  Musculoskeletal: Positive for arthralgias, joint pain and morning stiffness. Negative for joint swelling, myalgias, muscle weakness, muscle tenderness  and myalgias.  Skin: Negative for color change, rash, hair loss, skin tightness, ulcers and sensitivity to sunlight.  Allergic/Immunologic: Negative for susceptible to infections.  Neurological: Negative for dizziness, memory loss, night sweats and weakness.  Hematological: Negative for swollen glands.  Psychiatric/Behavioral: Positive for sleep disturbance. Negative for depressed mood. The patient is not nervous/anxious.     PMFS History:  Patient Active Problem List   Diagnosis Date Noted  . History of diverticulitis 11/16/2018  . Dyslipidemia 11/16/2018  . Primary open angle glaucoma (POAG) of both eyes, mild stage 09/17/2018  . Positive ANA (antinuclear antibody) 09/17/2018  . Colon polyp 12/18/2017  . Chronic obstructive pulmonary disease (COPD) ([place]) 12/18/2017  . Shortness of breath 12/18/2017  . Vitamin B12 deficiency anemia 12/18/2017  . Asthma 11/17/2017  . GERD (gastroesophageal reflux disease) 11/17/2017  . Hx of adenomatous colonic polyps 11/17/2017  . Obesity (BMI 35.0-39.9 without comorbidity) 11/17/2017  . Pelvic mass in female 01/10/2017  . Varicose veins 07/24/2015  . Glaucoma suspect of right eye 04/15/2014  . Infection and inflammatory reaction due to other internal prosthetic devices, implants and grafts, initial encounter ([place]) 04/15/2014  . Pseudophakia of both eyes 04/15/2014  . Cystoid macular edema 03/18/2013  . Epiretinal membrane 03/18/2013    Past Medical History:  Diagnosis Date  . Asthma   . Bronchitis   . Cataracts, bilateral   . Colon polyps   . Depression   . Diverticulitis   . GERD (gastroesophageal reflux disease)   . Glaucoma   . Hyperlipidemia   . Hypothyroidism   . Lumbar herniated  disc     Family History  Problem Relation Age of Onset  . Breast cancer Sister   . Fibromyalgia Sister   . Allergies Son   . Asthma Son        as a child    Past Surgical History:  Procedure Laterality Date  . BIOPSY BREAST  2019  . cataract  surgery Bilateral   . COLONOSCOPY    . COLONOSCOPY WITH PROPOFOL N/A 01/29/2018   Procedure: COLONOSCOPY WITH PROPOFOL;  Surgeon: Manya Silvas, MD;  Location: [facility] ENDOSCOPY;  Service: Endoscopy;  Laterality: N/A;  . DILATION AND CURETTAGE OF UTERUS    . ESOPHAGOGASTRODUODENOSCOPY (EGD) WITH PROPOFOL N/A 01/29/2018   Procedure: ESOPHAGOGASTRODUODENOSCOPY (EGD) WITH PROPOFOL;  Surgeon: Manya Silvas, MD;  Location: [facility] ENDOSCOPY;  Service: Endoscopy;  Laterality: N/A;  . mamoplasty Bilateral    Social History   Social History Narrative   Lives at home with family   Immunization History  Administered Date(s) Administered  . Pneumococcal-Unspecified 09/09/2014     Objective: Vital Signs: BP 121/74 (BP Location: Left Arm, Patient Position: Sitting, Cuff Size: Normal)   Pulse 72   Resp 16   Ht 5\' 4"  (1.626 m)   Wt 218 lb 3.2 oz (99 kg)   BMI 37.45 kg/m    Physical Exam Vitals signs and nursing note reviewed.  Constitutional:      Appearance: She is well-developed.  HENT:     Head: Normocephalic and atraumatic.  Eyes:     Conjunctiva/sclera: Conjunctivae normal.  Neck:     Musculoskeletal: Normal range of motion.  Cardiovascular:     Rate and Rhythm: Normal rate and regular rhythm.     Heart sounds: Normal heart sounds.  Pulmonary:     Effort: Pulmonary effort is normal.     Breath sounds: Wheezing present.     Comments: Expiratory wheezes heard on auscultation. Abdominal:     General: Bowel sounds are normal.     Palpations: Abdomen is soft.  Lymphadenopathy:     Cervical: No cervical adenopathy.  Skin:    General: Skin is warm and dry.     Capillary Refill: Capillary refill takes less than 2 seconds.  Neurological:     Mental Status: She is alert and oriented to person, place, and time.  Psychiatric:        Behavior: Behavior normal.      Musculoskeletal Exam: C-spine thoracic lumbar spine good range of motion.  Shoulder joints elbow joints wrist joints  with good range of motion.  She has DIP and PIP thickening in her hands consistent with osteoarthritis.  Hip joints knee joints ankles MTPs PIPs with good range of motion with no synovitis.  CDAI Exam: CDAI Score: Not documented Patient Global Assessment: Not documented; Provider Global Assessment: Not documented Swollen: Not documented; Tender: Not documented Joint Exam   Not documented   There is currently no information documented on the homunculus. Go to the Rheumatology activity and complete the homunculus joint exam.  Investigation: No additional findings.  Imaging: No results found.  Recent Labs: Lab Results  Component Value Date   WBC 6.1 08/24/2018   HGB 13.8 08/24/2018   PLT 218 08/24/2018   NA 142 08/24/2018   K 4.3 08/24/2018   CL 103 08/24/2018   CO2 23 08/24/2018   GLUCOSE 91 08/24/2018   BUN 16 08/24/2018   CREATININE 0.82 08/24/2018   BILITOT 0.5 08/24/2018   ALKPHOS 73 08/24/2018   AST 17 08/24/2018  ALT 20 08/24/2018   PROT 6.6 08/24/2018   ALBUMIN 4.2 08/24/2018   CALCIUM 9.0 08/24/2018   GFRAA 87 08/24/2018  October 16, 2018 AVISE lupus index -1.3, ANA, ENA, CB CAP, Jo 1, RF, anti-CCP, anticardiolipin, beta-2 GP 1, antithyroglobulin, antithyroid peroxidase antibodies all negative  Speciality Comments: No specialty comments available.  Procedures:  No procedures performed Allergies: White petrolatum   Assessment / Plan:     Visit Diagnoses: On initial lab test patient had positive ANA (antinuclear antibody) - +ANA, +RNP initially, AVISE lupus index -1.3, repeat ANA and RNP negative.  History of recurrent eye inflammation per patient.  Awaiting ophthalmology records.  Patient has no other clinical features of autoimmune disease.  I have advised her to notify me if she develops any new symptoms.  Clinical features of autoimmune disease were discussed at length.  Primary osteoarthritis of both hands-she has DIP thickening in her hands.  Joint  protection muscle strengthening was discussed.  History of COPD - Patient was referred to  pulmonary due to shortness of breath.  She is having some wheezing today.  Patient was given prednisone taper for COPD and is also using inhalers currently.  History of diverticulitis  Primary open angle glaucoma (POAG) of both eyes, mild stage  Hx of adenomatous colonic polyps  Dyslipidemia  History of gastroesophageal reflux (GERD)-I am uncertain if reflux is contributing to her cough.  History of depression  History of hypothyroidism   Orders: No orders of the defined types were placed in this encounter.  No orders of the defined types were placed in this encounter.   Face-to-face time spent with patient was 30 minutes. Greater than 50% of time was spent in counseling and coordination of care.  Follow-Up Instructions: Return in about 3 months (around 02/26/2019) for Osteoarthritis, +ANA low titer.   Bo Merino, MD  Note - This record has been created using Editor, commissioning.  Chart creation errors have been sought, but may not always  have been located. Such creation errors do not reflect on  the standard of medical care.

## 2018-11-27 ENCOUNTER — Other Ambulatory Visit: Payer: Self-pay | Admitting: Internal Medicine

## 2018-11-27 ENCOUNTER — Encounter: Payer: Self-pay | Admitting: Rheumatology

## 2018-11-27 ENCOUNTER — Ambulatory Visit (INDEPENDENT_AMBULATORY_CARE_PROVIDER_SITE_OTHER): Payer: 59 | Admitting: Rheumatology

## 2018-11-27 VITALS — BP 121/74 | HR 72 | Resp 16 | Ht 64.0 in | Wt 218.2 lb

## 2018-11-27 DIAGNOSIS — R768 Other specified abnormal immunological findings in serum: Secondary | ICD-10-CM | POA: Diagnosis not present

## 2018-11-27 DIAGNOSIS — Z8659 Personal history of other mental and behavioral disorders: Secondary | ICD-10-CM

## 2018-11-27 DIAGNOSIS — R0602 Shortness of breath: Secondary | ICD-10-CM

## 2018-11-27 DIAGNOSIS — M19041 Primary osteoarthritis, right hand: Secondary | ICD-10-CM

## 2018-11-27 DIAGNOSIS — E785 Hyperlipidemia, unspecified: Secondary | ICD-10-CM | POA: Diagnosis not present

## 2018-11-27 DIAGNOSIS — Z8709 Personal history of other diseases of the respiratory system: Secondary | ICD-10-CM

## 2018-11-27 DIAGNOSIS — H401131 Primary open-angle glaucoma, bilateral, mild stage: Secondary | ICD-10-CM | POA: Diagnosis not present

## 2018-11-27 DIAGNOSIS — Z8639 Personal history of other endocrine, nutritional and metabolic disease: Secondary | ICD-10-CM

## 2018-11-27 DIAGNOSIS — Z86010 Personal history of colonic polyps: Secondary | ICD-10-CM

## 2018-11-27 DIAGNOSIS — Z8719 Personal history of other diseases of the digestive system: Secondary | ICD-10-CM | POA: Diagnosis not present

## 2018-11-27 DIAGNOSIS — M19042 Primary osteoarthritis, left hand: Secondary | ICD-10-CM

## 2018-11-27 DIAGNOSIS — R69 Illness, unspecified: Secondary | ICD-10-CM | POA: Diagnosis not present

## 2018-11-30 ENCOUNTER — Ambulatory Visit (INDEPENDENT_AMBULATORY_CARE_PROVIDER_SITE_OTHER): Payer: 59 | Admitting: Family Medicine

## 2018-11-30 ENCOUNTER — Other Ambulatory Visit: Payer: Self-pay

## 2018-11-30 VITALS — BP 120/78 | HR 78 | Temp 99.0°F | Resp 18 | Ht 64.0 in

## 2018-11-30 DIAGNOSIS — J988 Other specified respiratory disorders: Secondary | ICD-10-CM

## 2018-11-30 DIAGNOSIS — R05 Cough: Secondary | ICD-10-CM

## 2018-11-30 DIAGNOSIS — J449 Chronic obstructive pulmonary disease, unspecified: Secondary | ICD-10-CM

## 2018-11-30 DIAGNOSIS — R062 Wheezing: Secondary | ICD-10-CM | POA: Diagnosis not present

## 2018-11-30 DIAGNOSIS — R059 Cough, unspecified: Secondary | ICD-10-CM

## 2018-11-30 DIAGNOSIS — R0989 Other specified symptoms and signs involving the circulatory and respiratory systems: Secondary | ICD-10-CM

## 2018-11-30 LAB — POC INFLUENZA A&B (BINAX/QUICKVUE)
Influenza A, POC: NEGATIVE
Influenza B, POC: NEGATIVE

## 2018-11-30 LAB — POCT RAPID STREP A (OFFICE): Rapid Strep A Screen: NEGATIVE

## 2018-11-30 MED ORDER — LEVOFLOXACIN 750 MG PO TABS: 750.0000 mg | ORAL_TABLET | Freq: Every day | ORAL | 0 refills | Status: DC

## 2018-11-30 MED ORDER — IPRATROPIUM-ALBUTEROL 0.5-2.5 (3) MG/3ML IN SOLN: 3.0000 mL | RESPIRATORY_TRACT | 1 refills | Status: DC | PRN

## 2018-11-30 MED ORDER — PREDNISONE 10 MG (21) PO TBPK: ORAL_TABLET | ORAL | 0 refills | Status: DC

## 2018-11-30 MED ORDER — METHYLPREDNISOLONE ACETATE 40 MG/ML IJ SUSP: 40.0000 mg | Freq: Once | INTRAMUSCULAR | Status: DC

## 2018-11-30 NOTE — Progress Notes (Signed)
Subjective:    Patient ID: Karla Turner, female    DOB: [birth year], 68 y.o.   MRN: [id number]  HPI   Patient presents to clinic complaining of off-and-on fever, cough, thick green mucus with cough, sinus congestion, feeling wheezy for a month.  Patient states symptoms first began when she was away in [place].  States while in [place] she went to the emergency room and was treated with steroid taper, nebulizer and after doing this treatment she did begin to feel better.  Current symptoms began to worsen over the past 2 days.  Patient has had issues with breathing type exacerbations over this past year, seem to came up on her suddenly.  Denies chest pain.  Denies nausea/vomiting or diarrhea.  Denies any known sick contacts.  Patient states she did have a chest x-ray while in [place], this did not show any pneumonia.  Patient states she also has had a CT of the chest without contrast a few months ago, but was told that without contrast the image was not very informational, and she would most likely need a CT with contrast to further investigate the breathing symptoms that seem to have come on suddenly over the past year. Patient has seen pulmonology and cardiology.  She has upcoming pulmonology appointment in February 2020.  Patient Active Problem List   Diagnosis Date Noted  . History of diverticulitis 11/16/2018  . Dyslipidemia 11/16/2018  . Primary open angle glaucoma (POAG) of both eyes, mild stage 09/17/2018  . Positive ANA (antinuclear antibody) 09/17/2018  . Colon polyp 12/18/2017  . Chronic obstructive pulmonary disease (COPD) ([place]) 12/18/2017  . Shortness of breath 12/18/2017  . Vitamin B12 deficiency anemia 12/18/2017  . Asthma 11/17/2017  . GERD (gastroesophageal reflux disease) 11/17/2017  . Hx of adenomatous colonic polyps 11/17/2017  . Obesity (BMI 35.0-39.9 without comorbidity) 11/17/2017  . Pelvic mass in female 01/10/2017  . Varicose veins 07/24/2015  . Glaucoma suspect of  right eye 04/15/2014  . Infection and inflammatory reaction due to other internal prosthetic devices, implants and grafts, initial encounter ([place]) 04/15/2014  . Pseudophakia of both eyes 04/15/2014  . Cystoid macular edema 03/18/2013  . Epiretinal membrane 03/18/2013   Social History   Tobacco Use  . Smoking status: Never Smoker  . Smokeless tobacco: Never Used  Substance Use Topics  . Alcohol use: Yes    Comment: occasionally   Review of Systems  Constitutional: Negative for chills, fatigue and fever.  HENT: +sinus congestion, sore throat Eyes: Negative.   Respiratory: + cough, shortness of breath and wheezing.   Cardiovascular: Negative for chest pain, palpitations and leg swelling.  Gastrointestinal: Negative for abdominal pain, diarrhea, nausea and vomiting.  Genitourinary: Negative for dysuria, frequency and urgency.  Musculoskeletal: Negative for arthralgias and myalgias.  Skin: Negative for color change, pallor and rash.  Neurological: Negative for syncope, light-headedness and headaches.  Psychiatric/Behavioral: The patient is not nervous/anxious.       Objective:   Physical Exam Vitals signs and nursing note reviewed.  Constitutional:      General: She is not in acute distress.    Appearance: She is not toxic-appearing.  HENT:     Head: Normocephalic and atraumatic.     Nose: Congestion and rhinorrhea present.     Mouth/Throat:     Mouth: Mucous membranes are moist.  Eyes:     General: No scleral icterus.    Extraocular Movements: Extraocular movements intact.     Conjunctiva/sclera: Conjunctivae normal.  Neck:  Musculoskeletal: Neck supple.  Cardiovascular:     Rate and Rhythm: Normal rate and regular rhythm.  Pulmonary:     Effort: Pulmonary effort is normal. No respiratory distress.     Breath sounds: Wheezing and rhonchi present.     Comments: Scattered wheezes and rhonchi throughout Musculoskeletal:     Left lower leg: No edema.    Lymphadenopathy:     Cervical: No cervical adenopathy.  Skin:    General: Skin is warm and dry.     Coloration: Skin is not pale.  Neurological:     Mental Status: She is alert and oriented to person, place, and time.  Psychiatric:        Mood and Affect: Mood normal.        Behavior: Behavior normal.    Vitals:   11/30/18 1420  BP: 120/78  Pulse: 78  Resp: 18  Temp: 99 F (37.2 C)  SpO2: 96%      Assessment & Plan:   Cough, wheezes, rhonchi, respiratory infection, COPD - flu and strep testing are negative in clinic. we will treat patient with course of Levaquin and steroid taper.  She will use DuoNeb treatments at home.  Patient also given IM 40 mg of methylprednisolone in clinic x1 today.  Due to length of time with the symptoms off and on throughout this past year, we will order chest CT with contrast to further investigate.  She will keep regularly scheduled follow-up as planned with her pulmonologist.  Patient also has upcoming follow-up plan for here on December 18, 2018 and she will keep this appointment.

## 2018-12-02 LAB — CULTURE, UPPER RESPIRATORY
MICRO NUMBER: 101617
SPECIMEN QUALITY:: ADEQUATE

## 2018-12-04 DIAGNOSIS — H4051X1 Glaucoma secondary to other eye disorders, right eye, mild stage: Secondary | ICD-10-CM | POA: Diagnosis not present

## 2018-12-07 DIAGNOSIS — H4051X1 Glaucoma secondary to other eye disorders, right eye, mild stage: Secondary | ICD-10-CM | POA: Diagnosis not present

## 2018-12-12 DIAGNOSIS — M9901 Segmental and somatic dysfunction of cervical region: Secondary | ICD-10-CM | POA: Diagnosis not present

## 2018-12-12 DIAGNOSIS — M9905 Segmental and somatic dysfunction of pelvic region: Secondary | ICD-10-CM | POA: Diagnosis not present

## 2018-12-12 DIAGNOSIS — M955 Acquired deformity of pelvis: Secondary | ICD-10-CM | POA: Diagnosis not present

## 2018-12-12 DIAGNOSIS — M5033 Other cervical disc degeneration, cervicothoracic region: Secondary | ICD-10-CM | POA: Diagnosis not present

## 2018-12-13 ENCOUNTER — Ambulatory Visit: Payer: Self-pay | Admitting: Internal Medicine

## 2018-12-18 ENCOUNTER — Encounter: Payer: Self-pay | Admitting: Family Medicine

## 2018-12-18 ENCOUNTER — Ambulatory Visit: Payer: 59 | Admitting: Family Medicine

## 2018-12-18 VITALS — BP 102/72 | HR 76 | Temp 98.7°F | Resp 18 | Ht 64.0 in

## 2018-12-18 DIAGNOSIS — R0989 Other specified symptoms and signs involving the circulatory and respiratory systems: Secondary | ICD-10-CM

## 2018-12-18 DIAGNOSIS — R05 Cough: Secondary | ICD-10-CM | POA: Diagnosis not present

## 2018-12-18 DIAGNOSIS — J988 Other specified respiratory disorders: Secondary | ICD-10-CM | POA: Diagnosis not present

## 2018-12-18 DIAGNOSIS — H4051X1 Glaucoma secondary to other eye disorders, right eye, mild stage: Secondary | ICD-10-CM | POA: Diagnosis not present

## 2018-12-18 DIAGNOSIS — J449 Chronic obstructive pulmonary disease, unspecified: Secondary | ICD-10-CM

## 2018-12-18 DIAGNOSIS — R062 Wheezing: Secondary | ICD-10-CM | POA: Diagnosis not present

## 2018-12-18 DIAGNOSIS — R059 Cough, unspecified: Secondary | ICD-10-CM

## 2018-12-18 NOTE — Progress Notes (Signed)
Subjective:    Patient ID: Karla Turner, female    DOB: Feb 24, 1951, 68 y.o.   MRN: [id number]  HPI  Patient presents to clinic for follow-up on cough, wheezes, rhonchi, respiratory infection and COPD.  Patient been dealing with cough, shortness of breath and wheezing for a little over a month which came at the end of January 2020.  She had been treated in [place] with steroid pills and inhaler.  We treated her with antibiotics, steroids, and DuoNeb treatments.  Patient is feeling much better.  Cough has resolved.  No longer feels short of breath or having any wheezing.  We attempted to get CT of chest ordered due to patient's difficulty with breathing symptoms off and on over the past few months, but CT scan was denied by insurance.  Patient does have upcoming pulmonology appointment planned for March 2020.  Patient has never had breathing issues prior to taking a beta-blocker eyedrop for her open-angle glaucoma.  Currently she is off of the beta-blocker eyedrops is an on different regimen.  She follows closely with Dr. Lyndel Safe for her glaucoma.   Patient Active Problem List   Diagnosis Date Noted  . History of diverticulitis 11/16/2018  . Dyslipidemia 11/16/2018  . Primary open angle glaucoma (POAG) of both eyes, mild stage 09/17/2018  . Positive ANA (antinuclear antibody) 09/17/2018  . Colon polyp 12/18/2017  . Chronic obstructive pulmonary disease (COPD) ([place]) 12/18/2017  . Shortness of breath 12/18/2017  . Vitamin B12 deficiency anemia 12/18/2017  . Asthma 11/17/2017  . GERD (gastroesophageal reflux disease) 11/17/2017  . Hx of adenomatous colonic polyps 11/17/2017  . Obesity (BMI 35.0-39.9 without comorbidity) 11/17/2017  . Pelvic mass in female 01/10/2017  . Varicose veins 07/24/2015  . Glaucoma suspect of right eye 04/15/2014  . Infection and inflammatory reaction due to other internal prosthetic devices, implants and grafts, initial encounter ([place]) 04/15/2014  .  Pseudophakia of both eyes 04/15/2014  . Cystoid macular edema 03/18/2013  . Epiretinal membrane 03/18/2013   Social History   Tobacco Use  . Smoking status: Never Smoker  . Smokeless tobacco: Never Used  Substance Use Topics  . Alcohol use: Yes    Comment: occasionally   Review of Systems  Constitutional: Negative for chills, fatigue and fever.  HENT: Negative for congestion, ear pain, sinus pain and sore throat.   Eyes: Negative.   Respiratory: Negative for cough, shortness of breath and wheezing.   Cardiovascular: Negative for chest pain, palpitations and leg swelling.  Gastrointestinal: Negative for abdominal pain, diarrhea, nausea and vomiting.  Genitourinary: Negative for dysuria, frequency and urgency.  Musculoskeletal: Negative for arthralgias and myalgias.  Skin: Negative for color change, pallor and rash.  Neurological: Negative for syncope, light-headedness and headaches.  Psychiatric/Behavioral: The patient is not nervous/anxious.       Objective:   Physical Exam  Constitutional: She appears well-developed and well-nourished. No distress.  HENT:  Head: Normocephalic and atraumatic.  Eyes: Pupils are equal, round, and reactive to light. EOM are normal. No scleral icterus.  Neck: Normal range of motion. Neck supple. No tracheal deviation present.  Cardiovascular: Normal rate, regular rhythm and normal heart sounds.  Pulmonary/Chest: Effort normal and breath sounds normal. No respiratory distress. She has no wheezes. She has no rales. No rhonchi.   Neurological: She is alert and oriented to person, place, and time.  Gait normal  Skin: Skin is warm and dry. No pallor.  Psychiatric: She has a normal mood and affect. Her behavior is  normal. Thought content normal.  Nursing note and vitals reviewed.   Vitals:   12/18/18 0916 12/18/18 0930  BP: 102/72   Pulse: 76   Resp: 18   Temp: 99.7 F (37.6 C) 98.7 F (37.1 C)  SpO2: 96%        Assessment & Plan:    Cough, wheezes, rhonchi, respiratory infection, COPD - cough, wheezes, rhonchi have all resolved.  Patient is feeling much better.  She will keep pulmonary  Consult as planned in March 2020 due to development of these respiratory issues throughout the past year or so. She will continue Anoro and use albuterol as needed.   Patient will follow-up here in 3 months for recheck of chronic medical conditions.  We will plan to do new blood work at that time.

## 2018-12-19 DIAGNOSIS — M9905 Segmental and somatic dysfunction of pelvic region: Secondary | ICD-10-CM | POA: Diagnosis not present

## 2018-12-19 DIAGNOSIS — M955 Acquired deformity of pelvis: Secondary | ICD-10-CM | POA: Diagnosis not present

## 2018-12-19 DIAGNOSIS — M9901 Segmental and somatic dysfunction of cervical region: Secondary | ICD-10-CM | POA: Diagnosis not present

## 2018-12-19 DIAGNOSIS — M5033 Other cervical disc degeneration, cervicothoracic region: Secondary | ICD-10-CM | POA: Diagnosis not present

## 2018-12-26 DIAGNOSIS — M9901 Segmental and somatic dysfunction of cervical region: Secondary | ICD-10-CM | POA: Diagnosis not present

## 2018-12-26 DIAGNOSIS — M9905 Segmental and somatic dysfunction of pelvic region: Secondary | ICD-10-CM | POA: Diagnosis not present

## 2018-12-26 DIAGNOSIS — M5033 Other cervical disc degeneration, cervicothoracic region: Secondary | ICD-10-CM | POA: Diagnosis not present

## 2018-12-26 DIAGNOSIS — M955 Acquired deformity of pelvis: Secondary | ICD-10-CM | POA: Diagnosis not present

## 2019-01-01 ENCOUNTER — Ambulatory Visit: Payer: Self-pay | Admitting: Internal Medicine

## 2019-01-01 DIAGNOSIS — H4051X1 Glaucoma secondary to other eye disorders, right eye, mild stage: Secondary | ICD-10-CM | POA: Diagnosis not present

## 2019-01-02 DIAGNOSIS — M5033 Other cervical disc degeneration, cervicothoracic region: Secondary | ICD-10-CM | POA: Diagnosis not present

## 2019-01-02 DIAGNOSIS — M9901 Segmental and somatic dysfunction of cervical region: Secondary | ICD-10-CM | POA: Diagnosis not present

## 2019-01-02 DIAGNOSIS — M955 Acquired deformity of pelvis: Secondary | ICD-10-CM | POA: Diagnosis not present

## 2019-01-02 DIAGNOSIS — M9905 Segmental and somatic dysfunction of pelvic region: Secondary | ICD-10-CM | POA: Diagnosis not present

## 2019-01-08 ENCOUNTER — Ambulatory Visit (INDEPENDENT_AMBULATORY_CARE_PROVIDER_SITE_OTHER): Payer: 59 | Admitting: Pulmonary Disease

## 2019-01-08 ENCOUNTER — Encounter: Payer: Self-pay | Admitting: Pulmonary Disease

## 2019-01-08 VITALS — BP 120/76 | HR 66 | Ht 64.0 in

## 2019-01-08 DIAGNOSIS — R059 Cough, unspecified: Secondary | ICD-10-CM

## 2019-01-08 DIAGNOSIS — J471 Bronchiectasis with (acute) exacerbation: Secondary | ICD-10-CM

## 2019-01-08 DIAGNOSIS — R05 Cough: Secondary | ICD-10-CM | POA: Diagnosis not present

## 2019-01-08 NOTE — Progress Notes (Signed)
Synopsis: Referred in March 2020 for dyspnea  Subjective:   PATIENT ID: Karla Turner GENDER: female DOB: Oct 02, 1951, MRN: [id number]   HPI  Chief Complaint  Patient presents with  . Pulm Consult    Referred by Dr. Estanislado Pandy for shortness of breath. This has been going on since 2018.     This is a pleasant 68 year old female with a history of glaucoma who comes to my clinic today for evaluation of recurrent episodes of cough and bronchitis over the years.  She tells me that as a child she had repeated episodes of pneumonia and was told that she nearly died prior to going to school because of the frequency of her pneumonia infections.  She does not believe that she was born prematurely though she knows that she was born at only 5 pounds.  She says that both of her parents smoke frequently in the home though she never smoked.  As a young adult she had repeated episodes of bronchitis and she does remember having pneumonia as a young adult at least once as well.  However, over the years she has had repeated episodes of bronchitis typically in the wintertime.  These have increased in frequency since January 2018 when she was visiting [place] and the weather was quite hot and unfortunately she did not have air conditioning in her hotel room.  This led to an asthma flareup or something like it and she started taking an inhaler at that time.  She was referred to see a pulmonologist in [place] who placed her on Anoro.  She says that ever since then she has had repeated episodes of bronchitis.  Most recently she had been traveling to [place] in early January.  While there she was hospitalized for asthma, came home and required more asthma treatment specifically a steroid injection and antibiotics.  These medicines were effective and she feels back to baseline.  She has had some increased shortness of breath with exertion over the years which she attributes to weight gain.  She says it occurs primarily when  climbing stairs.  She says hot weather will make her more short of breath as well.  She is here today for me to give an assessment for what is going on with her lungs.  Past Medical History:  Diagnosis Date  . Asthma   . Bronchitis   . Cataracts, bilateral   . Colon polyps   . Depression   . Diverticulitis   . GERD (gastroesophageal reflux disease)   . Glaucoma   . Hyperlipidemia   . Hypothyroidism   . Lumbar herniated disc      Family History  Problem Relation Age of Onset  . Breast cancer Sister   . Fibromyalgia Sister   . Allergies Son   . Asthma Son        as a child      Social History   Socioeconomic History  . Marital status: Married    Spouse name: Not on file  . Number of children: Not on file  . Years of education: Not on file  . Highest education level: Not on file  Occupational History  . Not on file  Social Needs  . Financial resource strain: Not on file  . Food insecurity:    Worry: Not on file    Inability: Not on file  . Transportation needs:    Medical: Not on file    Non-medical: Not on file  Tobacco Use  . Smoking status:  Never Smoker  . Smokeless tobacco: Never Used  Substance and Sexual Activity  . Alcohol use: Yes    Comment: occasionally  . Drug use: No  . Sexual activity: Yes  Lifestyle  . Physical activity:    Days per week: Not on file    Minutes per session: Not on file  . Stress: Not on file  Relationships  . Social connections:    Talks on phone: Not on file    Gets together: Not on file    Attends religious service: Not on file    Active member of club or organization: Not on file    Attends meetings of clubs or organizations: Not on file    Relationship status: Not on file  . Intimate partner violence:    Fear of current or ex partner: Not on file    Emotionally abused: Not on file    Physically abused: Not on file    Forced sexual activity: Not on file  Other Topics Concern  . Not on file  Social History Narrative    Lives at home with family     Allergies  Allergen Reactions  . White Petrolatum     Other reaction(s): Other (See Comments) Pt states that she used Invisalign (which she states is a petroleum product and afterward developed systemic "poisoning."     Outpatient Medications Prior to Visit  Medication Sig Dispense Refill  . albuterol (PROVENTIL HFA;VENTOLIN HFA) 108 (90 Base) MCG/ACT inhaler Inhale 2 puffs into the lungs every 6 (six) hours as needed for wheezing or shortness of breath. 1 Inhaler 5  . ALBUTEROL IN Inhale into the lungs.    Jearl Klinefelter ELLIPTA 62.5-25 MCG/INH AEPB INHALE ONE DOSE BY MOUTH DAILY 180 each 1  . brinzolamide (AZOPT) 1 % ophthalmic suspension Place 1 drop into the right eye 3 (three) times daily.    . methazolamide (NEPTAZANE) 25 MG tablet Take 25 mg by mouth 3 (three) times daily.    Marland Kitchen omeprazole (PRILOSEC OTC) 20 MG tablet Take by mouth.    Marland Kitchen OVER THE COUNTER MEDICATION Given in [place].    . pantoprazole (PROTONIX) 40 MG tablet Take 40 mg by mouth daily.    Marland Kitchen TIROSINT 100 MCG CAPS TAKE ONE CAPSULE BY MOUTH EVERY MORNING ON AN EMPTY STOMACH 90 capsule 10  . ipratropium-albuterol (DUONEB) 0.5-2.5 (3) MG/3ML SOLN Take 3 mLs by nebulization every 4 (four) hours as needed. 120 mL 1  . Ipratropium Bromide (ATROVENT IN) Inhale into the lungs. Given in [place].    Marland Kitchen levofloxacin (LEVAQUIN) 750 MG tablet Take 1 tablet (750 mg total) by mouth daily. 7 tablet 0  . predniSONE (STERAPRED UNI-PAK 21 TAB) 10 MG (21) TBPK tablet Take according to pack instructions 21 tablet 0  . PREDNISONE PO Take 25 mg by mouth as needed.     Facility-Administered Medications Prior to Visit  Medication Dose Route Frequency Provider Last Rate Last Dose  . methylPREDNISolone acetate (DEPO-MEDROL) injection 40 mg  40 mg Intramuscular Once Guse, Jacquelynn Cree, FNP        Review of Systems  Constitutional: Positive for malaise/fatigue. Negative for chills, fever and weight loss.  HENT: Negative for  congestion, nosebleeds, sinus pain and sore throat.   Eyes: Negative for photophobia, pain and discharge.  Respiratory: Positive for cough, sputum production and shortness of breath. Negative for hemoptysis and wheezing.   Cardiovascular: Negative for chest pain, palpitations, orthopnea and leg swelling.  Gastrointestinal: Negative for abdominal pain, constipation,  diarrhea, nausea and vomiting.  Genitourinary: Negative for dysuria, frequency, hematuria and urgency.  Musculoskeletal: Negative for back pain, joint pain, myalgias and neck pain.  Skin: Negative for itching and rash.  Neurological: Negative for tingling, tremors, sensory change, speech change, focal weakness, seizures, weakness and headaches.  Psychiatric/Behavioral: Negative for memory loss, substance abuse and suicidal ideas. The patient is not nervous/anxious.       Objective:  Physical Exam   Vitals:   01/08/19 1508  BP: 120/76  Pulse: 66  SpO2: 99%  Height: 5\' 4"  (1.626 m)    Gen: well appearing, no acute distress HENT: NCAT, OP clear, neck supple without masses Eyes: PERRL, EOMi Lymph: no cervical lymphadenopathy PULM: CTA B CV: RRR, no mgr, no JVD GI: BS+, soft, nontender, no hsm Derm: no rash or skin breakdown MSK: normal bulk and tone Neuro: A&Ox4, CN II-XII intact, strength 5/5 in all 4 extremities Psyche: normal mood and affect   CBC    Component Value Date/Time   WBC 6.1 08/24/2018 1111   WBC 10.5 07/20/2017 0827   RBC 4.59 08/24/2018 1111   RBC 4.48 07/20/2017 0827   HGB 13.8 08/24/2018 1111   HCT 41.8 08/24/2018 1111   PLT 218 08/24/2018 1111   MCV 91 08/24/2018 1111   MCV 88 02/21/2013 2153   MCH 30.1 08/24/2018 1111   MCH 31.1 07/20/2017 0827   MCHC 33.0 08/24/2018 1111   MCHC 34.6 07/20/2017 0827   RDW 12.3 08/24/2018 1111   RDW 12.4 02/21/2013 2153   LYMPHSABS 2.0 08/24/2018 1111   EOSABS 0.1 08/24/2018 1111   BASOSABS 0.1 08/24/2018 1111     Chest imaging: November 2019  CT chest images independently reviewed showing no pulmonary parenchymal abnormality with the exception of increased airway caliber specifically in the lower lobes bilaterally but no mucus plugging, no pulmonary parenchymal infiltrate, no masses.  FENO 01/2019 11 ppb  PFT:  Labs:  Path:  Echo:  Heart Catheterization:   Records from her 12/2018 visit with primary care reviewed where she was seen for wheezing, COPD, abnormal chest exam and was treated with duoneb, Anoro, and sent here     Assessment & Plan:   Cough - Plan: Pulmonary Function Test, Nitric oxide, CANCELED: DG Chest 2 View  Bronchiectasis with acute exacerbation ([place]) - Plan: IgG, IgA, IgM, IgG Subclasses, Alpha-1 antitrypsin phenotype  Discussion: This is a pleasant 68 year old female who comes to my clinic today to establish care for repeated episodes of bronchitis ever since having childhood pneumonia.  She has increased airway caliber on my review of her CT chest consistent with bronchiectasis but this does seem more like a smooth cylindrical bronchiectasis rather than a cystic or mucopurulent variant.  I wonder about the possibility of concomitant asthma and I also wonder about some problems with her immune system such as immunoglobulin deficiency or an underlying alpha-1 antitrypsin deficiency.  Plan: Bronchiectasis: This is the medical term that means that your airways are bigger more dilated than they should be.  This makes you more susceptible to infections. We will assess this problem further with a lung function test We will check blood work to look for evidence of immunodeficiency: Serum IgE, IgM, IgA, serum IgG subclasses We will check a blood test called an alpha-1 antitrypsin test to make sure that she do not have a genetic condition which predisposes you to this for now stop taking Anoro, I do not think it is helpful The nitric oxide test we did today did  not show evidence of significant inflammation in your  airways so I do not think you need to take any inhaled medicines for now, we will readdress this after the lung function test  We will see you back in about 2 weeks with a nurse practitioner to go over the results of the lung function test and blood work.    Current Outpatient Medications:  .  albuterol (PROVENTIL HFA;VENTOLIN HFA) 108 (90 Base) MCG/ACT inhaler, Inhale 2 puffs into the lungs every 6 (six) hours as needed for wheezing or shortness of breath., Disp: 1 Inhaler, Rfl: 5 .  ALBUTEROL IN, Inhale into the lungs., Disp: , Rfl:  .  ANORO ELLIPTA 62.5-25 MCG/INH AEPB, INHALE ONE DOSE BY MOUTH DAILY, Disp: 180 each, Rfl: 1 .  brinzolamide (AZOPT) 1 % ophthalmic suspension, Place 1 drop into the right eye 3 (three) times daily., Disp: , Rfl:  .  methazolamide (NEPTAZANE) 25 MG tablet, Take 25 mg by mouth 3 (three) times daily., Disp: , Rfl:  .  omeprazole (PRILOSEC OTC) 20 MG tablet, Take by mouth., Disp: , Rfl:  .  OVER THE COUNTER MEDICATION, Given in [place]., Disp: , Rfl:  .  pantoprazole (PROTONIX) 40 MG tablet, Take 40 mg by mouth daily., Disp: , Rfl:  .  TIROSINT 100 MCG CAPS, TAKE ONE CAPSULE BY MOUTH EVERY MORNING ON AN EMPTY STOMACH, Disp: 90 capsule, Rfl: 10  Current Facility-Administered Medications:  .  methylPREDNISolone acetate (DEPO-MEDROL) injection 40 mg, 40 mg, Intramuscular, Once, Guse, Jacquelynn Cree, FNP

## 2019-01-08 NOTE — Progress Notes (Deleted)
   Subjective:    Patient ID: Karla Turner, female    DOB: 11/20/1950, 68 y.o.   MRN: [id number]  HPI    Review of Systems     Objective:   Physical Exam        Assessment & Plan:

## 2019-01-08 NOTE — Progress Notes (Deleted)
   Subjective:    Patient ID: Karla Turner, female    DOB: 1951-01-07, 68 y.o.   MRN: [id number]  HPI    Review of Systems  Constitutional: Negative for fever and unexpected weight change.  HENT: Positive for congestion. Negative for dental problem, ear pain, nosebleeds, postnasal drip, rhinorrhea, sinus pressure, sneezing, sore throat and trouble swallowing.   Eyes: Negative for redness and itching.  Respiratory: Positive for cough and shortness of breath. Negative for chest tightness and wheezing.   Cardiovascular: Negative for palpitations and leg swelling.  Gastrointestinal: Negative for nausea and vomiting.  Genitourinary: Negative for dysuria.  Musculoskeletal: Negative for joint swelling.  Skin: Negative for rash.  Allergic/Immunologic: Negative.  Negative for environmental allergies, food allergies and immunocompromised state.  Neurological: Negative for headaches.  Hematological: Does not bruise/bleed easily.  Psychiatric/Behavioral: Negative for dysphoric mood. The patient is not nervous/anxious.        Objective:   Physical Exam        Assessment & Plan:

## 2019-01-08 NOTE — Patient Instructions (Signed)
Bronchiectasis: This is the medical term that means that your airways are bigger more dilated than they should be.  This makes you more susceptible to infections. We will assess this problem further with a lung function test We will check blood work to look for evidence of immunodeficiency: Serum IgE, IgM, IgA, serum IgG subclasses We will check a blood test called an alpha-1 antitrypsin test to make sure that she do not have a genetic condition which predisposes you to this for now stop taking Anoro, I do not think it is helpful The nitric oxide test we did today did not show evidence of significant inflammation in your airways so I do not think you need to take any inhaled medicines for now, we will readdress this after the lung function test  We will see you back in about 2 weeks with a nurse practitioner to go over the results of the lung function test and blood work.

## 2019-01-16 LAB — ALPHA-1 ANTITRYPSIN PHENOTYPE: A-1 Antitrypsin, Ser: 130 mg/dL (ref 83–199)

## 2019-01-16 LAB — IGG SUBCLASSES
IGG SUBCLASS 1: 549 mg/dL (ref 382–929)
IgG Subclass 2: 346 mg/dL (ref 241–700)
IgG Subclass 3: 46 mg/dL (ref 22–178)
IgG Subclass 4: 12.7 mg/dL (ref 4–86)
Immunoglobulin G, Serum: 918 mg/dL (ref 600–1540)

## 2019-01-16 LAB — IGG, IGA, IGM
IGM, SERUM: 124 mg/dL (ref 50–300)
IgG (Immunoglobin G), Serum: 957 mg/dL (ref 600–1540)
Immunoglobulin A: 86 mg/dL (ref 70–320)

## 2019-01-22 ENCOUNTER — Telehealth: Payer: Self-pay | Admitting: Pulmonary Disease

## 2019-01-22 NOTE — Telephone Encounter (Signed)
Spoke with pt and relayed results.  Pt stated she had an upcoming appt but cancelled and will reschedule when the corona virus restrictions are lifted.  She stated she is feeling good and will call if she needs anything.

## 2019-01-24 ENCOUNTER — Ambulatory Visit: Payer: 59 | Admitting: Pulmonary Disease

## 2019-02-22 ENCOUNTER — Other Ambulatory Visit: Payer: Self-pay

## 2019-02-22 ENCOUNTER — Ambulatory Visit (INDEPENDENT_AMBULATORY_CARE_PROVIDER_SITE_OTHER): Payer: 59 | Admitting: Family Medicine

## 2019-02-22 ENCOUNTER — Encounter: Payer: Self-pay | Admitting: Family Medicine

## 2019-02-22 ENCOUNTER — Telehealth: Payer: Self-pay

## 2019-02-22 VITALS — HR 74 | Temp 98.6°F

## 2019-02-22 DIAGNOSIS — J4531 Mild persistent asthma with (acute) exacerbation: Secondary | ICD-10-CM | POA: Diagnosis not present

## 2019-02-22 DIAGNOSIS — J302 Other seasonal allergic rhinitis: Secondary | ICD-10-CM | POA: Diagnosis not present

## 2019-02-22 MED ORDER — METHYLPREDNISOLONE 4 MG PO TBPK: ORAL_TABLET | ORAL | 0 refills | Status: DC

## 2019-02-22 MED ORDER — AZITHROMYCIN 250 MG PO TABS: ORAL_TABLET | ORAL | 0 refills | Status: DC

## 2019-02-22 NOTE — Progress Notes (Signed)
Patient ID: Karla Turner, female   DOB: 1951/08/01, 68 y.o.   MRN: [id number]  Virtual Visit via video Note  This visit type was conducted due to national recommendations for restrictions regarding the COVID-19 pandemic (e.g. social distancing).  This format is felt to be most appropriate for this patient at this time.  All issues noted in this document were discussed and addressed.  No physical exam was performed (except for noted visual exam findings with Video Visits).   I connected with Karla Turner on 02/22/19 at  3:00 PM EDT by a video enabled telemedicine application and verified that I am speaking with the correct person using two identifiers. Location patient: home Location provider: [facility] Persons participating in the virtual visit: patient, provider  I discussed the limitations, risks, security and privacy concerns of performing an evaluation and management service by video and the availability of in person appointments. I also discussed with the patient that there may be a patient responsible charge related to this service. The patient expressed understanding and agreed.    HPI:  Patient and I connected via video chat due to complaints of congestion, wheezing that she suspects are caused by pollen -- she was outdoors all day yesterday doing yardwork with her husband.   Patient does have a history of asthma, which sometimes can be exacerbated by seasonal allergies.  Currently she is not taking any sort of antihistamine.  She does have an albuterol inhaler and also DuoNeb nebulizer solution, patient has not used nebulizer solution yet.  States she did take a dose of her albuterol inhaler last night prior to bed due to feeling wheezy upon laying down.  Does also have a dry cough.  Denies chest pain.  Denies body aches.  Denies fever or chills.  Denies GI or GU issues.   ROS:   See pertinent positives and negatives per HPI.  Past Medical History:  Diagnosis Date  .  Asthma   . Bronchitis   . Cataracts, bilateral   . Colon polyps   . Depression   . Diverticulitis   . GERD (gastroesophageal reflux disease)   . Glaucoma   . Hyperlipidemia   . Hypothyroidism   . Lumbar herniated disc     Past Surgical History:  Procedure Laterality Date  . BIOPSY BREAST  2019  . cataract surgery Bilateral   . COLONOSCOPY    . COLONOSCOPY WITH PROPOFOL N/A 01/29/2018   Procedure: COLONOSCOPY WITH PROPOFOL;  Surgeon: Manya Silvas, MD;  Location[facility] ENDOSCOPY;  Service: Endoscopy;  Laterality: N/A;  . DILATION AND CURETTAGE OF UTERUS    . ESOPHAGOGASTRODUODENOSCOPY (EGD) WITH PROPOFOL N/A 01/29/2018   Procedure: ESOPHAGOGASTRODUODENOSCOPY (EGD) WITH PROPOFOL;  Surgeon: Manya Silvas, MD;  Location: [facility] ENDOSCOPY;  Service: Endoscopy;  Laterality: N/A;  . mamoplasty Bilateral     Family History  Problem Relation Age of Onset  . Breast cancer Sister   . Fibromyalgia Sister   . Allergies Son   . Asthma Son        as a child     Social History   Tobacco Use  . Smoking status: Never Smoker  . Smokeless tobacco: Never Used  Substance Use Topics  . Alcohol use: Yes    Comment: occasionally    Current Outpatient Medications:  .  albuterol (PROVENTIL HFA;VENTOLIN HFA) 108 (90 Base) MCG/ACT inhaler, Inhale 2 puffs into the lungs every 6 (six) hours as needed for wheezing or shortness of breath., Disp: 1 Inhaler,  Rfl: 5 .  ALBUTEROL IN, Inhale into the lungs., Disp: , Rfl:  .  ANORO ELLIPTA 62.5-25 MCG/INH AEPB, INHALE ONE DOSE BY MOUTH DAILY, Disp: 180 each, Rfl: 1 .  brinzolamide (AZOPT) 1 % ophthalmic suspension, Place 1 drop into the right eye 3 (three) times daily., Disp: , Rfl:  .  methazolamide (NEPTAZANE) 25 MG tablet, Take 25 mg by mouth 3 (three) times daily., Disp: , Rfl:  .  omeprazole (PRILOSEC OTC) 20 MG tablet, Take by mouth., Disp: , Rfl:  .  OVER THE COUNTER MEDICATION, Given in [place]., Disp: , Rfl:  .  pantoprazole (PROTONIX) 40 MG  tablet, Take 40 mg by mouth daily., Disp: , Rfl:  .  TIROSINT 100 MCG CAPS, TAKE ONE CAPSULE BY MOUTH EVERY MORNING ON AN EMPTY STOMACH, Disp: 90 capsule, Rfl: 10 .  azithromycin (ZITHROMAX) 250 MG tablet, Take 2 tablets on day 1, take 1 tablet on days 2-5, Disp: 6 tablet, Rfl: 0 .  methylPREDNISolone (MEDROL DOSEPAK) 4 MG TBPK tablet, Take according to pack instructions, Disp: 21 tablet, Rfl: 0  Current Facility-Administered Medications:  .  methylPREDNISolone acetate (DEPO-MEDROL) injection 40 mg, 40 mg, Intramuscular, Once, Ky Rumple M, FNP  EXAM:  Vitals:   02/22/19 1500  Pulse: 74  Temp: 98.6 F (37 C)  SpO2: 98%   GENERAL: alert, oriented, appears well and in no acute distress  HEENT: atraumatic, conjunttiva clear, no obvious abnormalities on inspection of external nose and ears  NECK: normal movements of the head and neck  LUNGS: on inspection no signs of respiratory distress, breathing rate appears normal, no obvious gross SOB, gasping or wheezing  CV: no obvious cyanosis  MS: moves all visible extremities without noticeable abnormality  PSYCH/NEURO: pleasant and cooperative, no obvious depression or anxiety, speech and thought processing grossly intact  ASSESSMENT AND PLAN:  Discussed the following assessment and plan:  Mild persistent asthmatic bronchitis with acute exacerbation - Plan: methylPREDNISolone (MEDROL DOSEPAK) 4 MG TBPK tablet, azithromycin (ZITHROMAX) 250 MG tablet  Seasonal allergies  I suspect patient is having an asthmatic bronchitis flareup related to season allergy after exacerbation of her asthma.  She will take low-dose steroid taper and we will cover for any development of respiratory infection with a Z-Pak.  Patient also has been advised to begin taking a daily antihistamine such as Claritin or Zyrtec to help reduce allergy symptoms and also she will do DuoNeb nebulizer treatments at least 2-3 times daily for the next 5 to 7 days, then after 5  to 7 days she will reduce to using nebulizer treatments only if needed.  Advised to call office right away if shortness of breath worsens, it is not helped by use of nebulizer or albuterol inhaler.  Is to also call office if she begins to develop any fever or develops new symptoms.    I discussed the assessment and treatment plan with the patient. The patient was provided an opportunity to ask questions and all were answered. The patient agreed with the plan and demonstrated an understanding of the instructions.   The patient was advised to call back or seek an in-person evaluation if the symptoms worsen or if the condition fails to improve as anticipated.   Jodelle Green, FNP

## 2019-02-22 NOTE — Telephone Encounter (Signed)
FYI Called and spoke to patient.  Pt c/o wheezing, shortness of breath, and a little cough.  Pt stated that symptoms started last night and has gotten worse today.  Pt has asthma and has been using her Albuterol inhaler but says that symptoms worsen with talking and movement.  Pt said she worked in the yard recently and feels that the asthma was induced by allergies.  Pt concerned that it may get into her lungs and turn into bronchitis.    No fever.  Pt denies any travel.  Denies being around anyone who is sick.  Said that both her and her husband work from home.  Pt scheduled for DOXY appt today @ 3:00 pm w/ Philis Nettle, FNP.

## 2019-02-22 NOTE — Telephone Encounter (Signed)
Copied from [place] [id number]. Topic: General - Inquiry >> Feb 22, 2019  1:53 PM Virl Axe D wrote: Reason for CRM: Pt stated she is having some issue regarding her asthma and would like to speak with Lauren or someone in office before the weekend. HW#[phone]

## 2019-02-22 NOTE — Telephone Encounter (Signed)
Called and spoke to patient.  Pt c/o wheezing, shortness of breath, and a little cough.  Pt stated that symptoms started last night and has gotten worse today.  Pt has asthma and has been using her Albuterol inhaler but says that symptoms worsen with talking and movement.  Pt said she worked in the yard recently and feels that the asthma was induced by allergies.  Pt concerned that it may get into her lungs and turn into bronchitis.    No fever.  Pt denies any travel.  Denies being around anyone who is sick.  Said that both her and her husband work from home.  Pt scheduled for DOXY appt today @ 3:00 pm w/ Philis Nettle, FNP.

## 2019-02-25 ENCOUNTER — Ambulatory Visit: Payer: 59 | Admitting: Rheumatology

## 2019-02-26 DIAGNOSIS — H4051X1 Glaucoma secondary to other eye disorders, right eye, mild stage: Secondary | ICD-10-CM | POA: Diagnosis not present

## 2019-03-04 ENCOUNTER — Telehealth: Payer: Self-pay | Admitting: *Deleted

## 2019-03-04 NOTE — Telephone Encounter (Signed)
Patient says the worst time is at night she is cough up green phlegm, patient the ABX and prednisone last Wednesday. Patient say her temperature has been 99-100 and sometimes , SOB at night. Doing Nebs 3 times daily and puffs in between. Has been good today so far. Scheduled Doxy.me for tomorrow.[phone]

## 2019-03-04 NOTE — Telephone Encounter (Signed)
Patient called [facility] on 03/02/19 complaint of worsening symptoms and was advised to see PCP , called patient left message to call office.Needs Virtual visit.

## 2019-03-04 NOTE — Telephone Encounter (Signed)
Copied from [place] [phone]. Topic: General - Other >> Mar 04, 2019 12:50 PM Alanda Slim E wrote: Reason for CRM: Pt called to return Cathy's call/ please advise

## 2019-03-05 ENCOUNTER — Encounter: Payer: Self-pay | Admitting: Family Medicine

## 2019-03-05 ENCOUNTER — Other Ambulatory Visit: Payer: Self-pay

## 2019-03-05 ENCOUNTER — Ambulatory Visit (INDEPENDENT_AMBULATORY_CARE_PROVIDER_SITE_OTHER): Payer: 59 | Admitting: Family Medicine

## 2019-03-05 DIAGNOSIS — J302 Other seasonal allergic rhinitis: Secondary | ICD-10-CM

## 2019-03-05 DIAGNOSIS — J4531 Mild persistent asthma with (acute) exacerbation: Secondary | ICD-10-CM | POA: Diagnosis not present

## 2019-03-05 DIAGNOSIS — J988 Other specified respiratory disorders: Secondary | ICD-10-CM | POA: Diagnosis not present

## 2019-03-05 MED ORDER — MONTELUKAST SODIUM 10 MG PO TABS: 10.0000 mg | ORAL_TABLET | Freq: Every day | ORAL | 2 refills | Status: DC

## 2019-03-05 MED ORDER — METHYLPREDNISOLONE 4 MG PO TBPK: ORAL_TABLET | ORAL | 0 refills | Status: DC

## 2019-03-05 MED ORDER — DOXYCYCLINE HYCLATE 100 MG PO TABS: 100.0000 mg | ORAL_TABLET | Freq: Two times a day (BID) | ORAL | 0 refills | Status: DC

## 2019-03-05 NOTE — Progress Notes (Signed)
Patient ID: Karla Turner, female   DOB: [birth year], 68 y.o.   MRN: [id number]  Virtual Visit via video Note  This visit type was conducted due to national recommendations for restrictions regarding the COVID-19 pandemic (e.g. social distancing).  This format is felt to be most appropriate for this patient at this time.  All issues noted in this document were discussed and addressed.  No physical exam was performed (except for noted visual exam findings with Video Visits).   I connected with Karla Turner on 03/05/19 at 10:00 AM EDT by a video enabled telemedicine application and verified that I am speaking with the correct person using two identifiers. Location patient: home Location provider: [facility] Persons participating in the virtual visit: patient, provider  I discussed the limitations, risks, security and privacy concerns of performing an evaluation and management service by video and the availability of in person appointments. I also discussed with the patient that there may be a patient responsible charge related to this service. The patient expressed understanding and agreed to proceed.   HPI:  Patient and I connected via video due to continued exacerbations of her asthma.  Patient was feeling better after doing Z-Pak and prednisone taper for a few days, but over the past weekend she is spending a lot of time outdoors again gardening.  Believes that the pollen and plans are a big trigger for her asthma.  She is not really had much of an issue with seasonal allergies, but this year seems to be very bad with the pollen and always notices she has some cough and increased shortness of breath or wheezing with congestion after being outdoors.  She does not take any sort of over-the-counter antihistamine, has, so has been advised to not take over-the-counter antihistamine like Claritin.  She had previously been on Anoro, but pulmonologist took her off this.  She is also been taking her  albuterol inhaler and doing nebulizer treatments.  Always feels better after taking inhaler and using nebulizer treatment.  States at times she will cough up green phlegm.  States she will usually cough up green phlegm more so at night after she has been laying down for an hour or so.  Denies chest pain.  Denies fever or chills.  Denies nausea, vomiting or diarrhea.  Denies body aches.  Denies fever or chills.  Patient has not left the home in approximately 4 weeks.  She has been self quarantining, they have been having her groceries delivered and have gone up to drive-throughs on occasion for food and to pick up prescriptions at the pharmacy.  ROS: See pertinent positives and negatives per HPI.  Past Medical History:  Diagnosis Date  . Asthma   . Bronchitis   . Cataracts, bilateral   . Colon polyps   . Depression   . Diverticulitis   . GERD (gastroesophageal reflux disease)   . Glaucoma   . Hyperlipidemia   . Hypothyroidism   . Lumbar herniated disc     Past Surgical History:  Procedure Laterality Date  . BIOPSY BREAST  2019  . cataract surgery Bilateral   . COLONOSCOPY    . COLONOSCOPY WITH PROPOFOL N/A 01/29/2018   Procedure: COLONOSCOPY WITH PROPOFOL;  Surgeon: Manya Silvas, MD;  Location: [facility] ENDOSCOPY;  Service: Endoscopy;  Laterality: N/A;  . DILATION AND CURETTAGE OF UTERUS    . ESOPHAGOGASTRODUODENOSCOPY (EGD) WITH PROPOFOL N/A 01/29/2018   Procedure: ESOPHAGOGASTRODUODENOSCOPY (EGD) WITH PROPOFOL;  Surgeon: Manya Silvas, MD;  Location:  [facility] ENDOSCOPY;  Service: Endoscopy;  Laterality: N/A;  . mamoplasty Bilateral     Family History  Problem Relation Age of Onset  . Breast cancer Sister   . Fibromyalgia Sister   . Allergies Son   . Asthma Son        as a child    Social History   Tobacco Use  . Smoking status: Never Smoker  . Smokeless tobacco: Never Used  Substance Use Topics  . Alcohol use: Yes    Comment: occasionally     Current  Outpatient Medications:  .  albuterol (PROVENTIL HFA;VENTOLIN HFA) 108 (90 Base) MCG/ACT inhaler, Inhale 2 puffs into the lungs every 6 (six) hours as needed for wheezing or shortness of breath., Disp: 1 Inhaler, Rfl: 5 .  ALBUTEROL IN, Inhale into the lungs., Disp: , Rfl:  .  azithromycin (ZITHROMAX) 250 MG tablet, Take 2 tablets on day 1, take 1 tablet on days 2-5, Disp: 6 tablet, Rfl: 0 .  brinzolamide (AZOPT) 1 % ophthalmic suspension, Place 1 drop into the right eye 3 (three) times daily., Disp: , Rfl:  .  methazolamide (NEPTAZANE) 25 MG tablet, Take 25 mg by mouth 3 (three) times daily., Disp: , Rfl:  .  methylPREDNISolone (MEDROL DOSEPAK) 4 MG TBPK tablet, Take according to pack instructions, Disp: 21 tablet, Rfl: 0 .  omeprazole (PRILOSEC OTC) 20 MG tablet, Take by mouth., Disp: , Rfl:  .  pantoprazole (PROTONIX) 40 MG tablet, Take 40 mg by mouth daily., Disp: , Rfl:  .  TIROSINT 100 MCG CAPS, TAKE ONE CAPSULE BY MOUTH EVERY MORNING ON AN EMPTY STOMACH, Disp: 90 capsule, Rfl: 10 .  doxycycline (VIBRA-TABS) 100 MG tablet, Take 1 tablet (100 mg total) by mouth 2 (two) times daily., Disp: 20 tablet, Rfl: 0 .  montelukast (SINGULAIR) 10 MG tablet, Take 1 tablet (10 mg total) by mouth at bedtime., Disp: 30 tablet, Rfl: 2  Current Facility-Administered Medications:  .  methylPREDNISolone acetate (DEPO-MEDROL) injection 40 mg, 40 mg, Intramuscular, Once, [name], Jacquelynn Cree, FNP  EXAM:   GENERAL: alert, oriented, appears well and in no acute distress  HEENT: atraumatic, conjunttiva clear, no obvious abnormalities on inspection of external nose and ears  NECK: normal movements of the head and neck  LUNGS: on inspection no signs of respiratory distress, breathing rate appears normal, no obvious gross SOB, gasping or wheezing. Does have a harsh cough, sounds wet.   CV: no obvious cyanosis  MS: moves all visible extremities without noticeable abnormality  PSYCH/NEURO: pleasant and cooperative,  no obvious depression or anxiety, speech and thought processing grossly intact  ASSESSMENT AND PLAN:  Discussed the following assessment and plan:  Mild persistent asthmatic bronchitis with acute exacerbation - Plan: methylPREDNISolone (MEDROL DOSEPAK) 4 MG TBPK tablet, doxycycline (VIBRA-TABS) 100 MG tablet, montelukast (SINGULAIR) 10 MG tablet  Seasonal allergies - Plan: montelukast (SINGULAIR) 10 MG tablet  Respiratory infection  I do suspect patient is having another asthma exacerbation most likely brought on by being outdoors and exposed to pollen and other plant life that is an asthma trigger for her.  Due to her glaucoma, we cannot do an oral antihistamine like Claritin but patient is willing to try montelukast at bedtime to see if this reduces allergy symptoms.  We will also do another oral steroid taper and she will take doxycycline twice daily for 10 days to cover respiratory bacterial infection.  Patient advised to continue using albuterol inhaler and nebulizers to help open up lungs and  treat any feelings of wheezing or shortness of breath.  She will continue to remain home as much as possible as she already has been doing.  Patient has been extra [place] during the COVID-19 pandemic due to her known asthma diagnosis. patient advised to continue to monitor self, if breathing worsens in any way she should call office and let us know immediately and or go to ER for evaluation.  Advised that if breathing is not helped with use of albuterol or nebulizer that is an alarm sign that she needs a higher level of care in emergency department.   I discussed the assessment and treatment plan with the patient. The patient was provided an opportunity to ask questions and all were answered. The patient agreed with the plan and demonstrated an understanding of the instructions.   The patient was advised to call back or seek an in-person evaluation if the symptoms worsen or if the condition fails to  improve as anticipated.  Jodelle Green, FNP

## 2019-03-15 ENCOUNTER — Encounter: Payer: Self-pay | Admitting: Family Medicine

## 2019-03-15 ENCOUNTER — Other Ambulatory Visit: Payer: Self-pay

## 2019-03-15 ENCOUNTER — Ambulatory Visit (INDEPENDENT_AMBULATORY_CARE_PROVIDER_SITE_OTHER): Payer: 59 | Admitting: Family Medicine

## 2019-03-15 DIAGNOSIS — J4531 Mild persistent asthma with (acute) exacerbation: Secondary | ICD-10-CM

## 2019-03-15 DIAGNOSIS — J479 Bronchiectasis, uncomplicated: Secondary | ICD-10-CM | POA: Insufficient documentation

## 2019-03-15 DIAGNOSIS — J471 Bronchiectasis with (acute) exacerbation: Secondary | ICD-10-CM | POA: Diagnosis not present

## 2019-03-15 MED ORDER — BUDESONIDE-FORMOTEROL FUMARATE 160-4.5 MCG/ACT IN AERO: 2.0000 | INHALATION_SPRAY | Freq: Two times a day (BID) | RESPIRATORY_TRACT | 2 refills | Status: DC

## 2019-03-15 MED ORDER — BUDESONIDE 0.25 MG/2ML IN SUSP: 0.2500 mg | Freq: Two times a day (BID) | RESPIRATORY_TRACT | 5 refills | Status: DC | PRN

## 2019-03-15 MED ORDER — METHYLPREDNISOLONE 4 MG PO TBPK: ORAL_TABLET | ORAL | 0 refills | Status: DC

## 2019-03-15 NOTE — Progress Notes (Signed)
Patient ID: Karla Turner, female   DOB: Jul 12, 1951, 68 y.o.   MRN: [id number]    Virtual Visit via video Note  This visit type was conducted due to national recommendations for restrictions regarding the COVID-19 pandemic (e.g. social distancing).  This format is felt to be most appropriate for this patient at this time.  All issues noted in this document were discussed and addressed.  No physical exam was performed (except for noted visual exam findings with Video Visits).   I connected with Karla Turner today at 10:40 AM EDT by a video enabled telemedicine application or telephone and verified that I am speaking with the correct person using two identifiers. Location patient: home Location provider: [facility] Persons participating in the virtual visit: patient, provider  I discussed the limitations, risks, security and privacy concerns of performing an evaluation and management service by video and the availability of in person appointments. I also discussed with the patient that there may be a patient responsible charge related to this service. The patient expressed understanding and agreed to proceed.   HPI:  Patient admitted via video due to recurrence of shortness of breath that wheezing is moist cough.  Patient has been battling with asthma/allergy triggers throughout the spring season seem to be causing exacerbations.  Patient has seen pulmonology back in March 2020 and was diagnosed with what most likely is to be bronchiectasis.  More more testing was planned with pulmonary function study and blood work, but all these were pushed back due to COVID-19 restrictions and patient deciding to remain home as much as possible due to her breathing problems.  Patient states after treatment with steroid and antibiotic that occurred at the end of April, she was feeling better for a few days.  That her symptoms of congestion increased cough and some wheezing began to slowly return.  Notices  especially worsening cough with little bit of wheezing when she lays down in bed at night.  Has been using nebulizer 3 times a day, and that uses albuterol inhaler on occasion when needed, keeps albuterol inhaler on her nightstand so she can quickly get it if having any shortness of breath when laying down.  Adding on the Singulair so far has seemed somewhat helpful.  Patient does enjoy going outdoors and gardening in her yard, and states it is very possible the pollen and other allergens could be exacerbating her breathing problems.  We are unable to have patient take something like a Claritin or Allegra every day due to her glaucoma.  Denies any fever or chills currently.  Denies chest pain or palpitations.  Denies GI or GU issues.  States a few days ago he did cough up a slightly green piece of phlegm, but otherwise cough is been either dry or slightly wet sounding.   ROS: See pertinent positives and negatives per HPI.  Past Medical History:  Diagnosis Date  . Asthma   . Bronchitis   . Cataracts, bilateral   . Colon polyps   . Depression   . Diverticulitis   . GERD (gastroesophageal reflux disease)   . Glaucoma   . Hyperlipidemia   . Hypothyroidism   . Lumbar herniated disc     Past Surgical History:  Procedure Laterality Date  . BIOPSY BREAST  2019  . cataract surgery Bilateral   . COLONOSCOPY    . COLONOSCOPY WITH PROPOFOL N/A 01/29/2018   Procedure: COLONOSCOPY WITH PROPOFOL;  Surgeon: Manya Silvas, MD;  Location: [facility] ENDOSCOPY;  Service: Endoscopy;  Laterality: N/A;  . DILATION AND CURETTAGE OF UTERUS    . ESOPHAGOGASTRODUODENOSCOPY (EGD) WITH PROPOFOL N/A 01/29/2018   Procedure: ESOPHAGOGASTRODUODENOSCOPY (EGD) WITH PROPOFOL;  Surgeon: Manya Silvas, MD;  Location[facility] ENDOSCOPY;  Service: Endoscopy;  Laterality: N/A;  . mamoplasty Bilateral     Family History  Problem Relation Age of Onset  . Breast cancer Sister   . Fibromyalgia Sister   . Allergies Son   .  Asthma Son        as a child    Social History   Tobacco Use  . Smoking status: Never Smoker  . Smokeless tobacco: Never Used  Substance Use Topics  . Alcohol use: Yes    Comment: occasionally    Current Outpatient Medications:  .  albuterol (PROVENTIL HFA;VENTOLIN HFA) 108 (90 Base) MCG/ACT inhaler, Inhale 2 puffs into the lungs every 6 (six) hours as needed for wheezing or shortness of breath., Disp: 1 Inhaler, Rfl: 5 .  ALBUTEROL IN, Inhale into the lungs., Disp: , Rfl:  .  azithromycin (ZITHROMAX) 250 MG tablet, Take 2 tablets on day 1, take 1 tablet on days 2-5, Disp: 6 tablet, Rfl: 0 .  brinzolamide (AZOPT) 1 % ophthalmic suspension, Place 1 drop into the right eye 3 (three) times daily., Disp: , Rfl:  .  doxycycline (VIBRA-TABS) 100 MG tablet, Take 1 tablet (100 mg total) by mouth 2 (two) times daily., Disp: 20 tablet, Rfl: 0 .  methazolamide (NEPTAZANE) 25 MG tablet, Take 25 mg by mouth 3 (three) times daily., Disp: , Rfl:  .  methylPREDNISolone (MEDROL DOSEPAK) 4 MG TBPK tablet, Take according to pack instructions, Disp: 21 tablet, Rfl: 0 .  montelukast (SINGULAIR) 10 MG tablet, Take 1 tablet (10 mg total) by mouth at bedtime., Disp: 30 tablet, Rfl: 2 .  omeprazole (PRILOSEC OTC) 20 MG tablet, Take by mouth., Disp: , Rfl:  .  pantoprazole (PROTONIX) 40 MG tablet, Take 40 mg by mouth daily., Disp: , Rfl:  .  TIROSINT 100 MCG CAPS, TAKE ONE CAPSULE BY MOUTH EVERY MORNING ON AN EMPTY STOMACH, Disp: 90 capsule, Rfl: 10  Current Facility-Administered Medications:  .  methylPREDNISolone acetate (DEPO-MEDROL) injection 40 mg, 40 mg, Intramuscular, Once, Breuna Loveall, Jacquelynn Cree, FNP  EXAM:  GENERAL: alert, oriented, appears well and in no acute distress  HEENT: atraumatic, conjunttiva clear, no obvious abnormalities on inspection of external nose and ears  NECK: normal movements of the head and neck  LUNGS: on inspection no signs of respiratory distress, breathing rate appears normal, no  obvious gross SOB, gasping or wheezing. Coughing on occasion throughout our visit, cough does sound raspy and harsh.   CV: no obvious cyanosis  MS: moves all visible extremities without noticeable abnormality  PSYCH/NEURO: pleasant and cooperative, no obvious depression or anxiety, speech and thought processing grossly intact  ASSESSMENT AND PLAN:  Discussed the following assessment and plan:  Mild persistent asthmatic bronchitis with acute exacerbation - Plan: budesonide-formoterol (SYMBICORT) 160-4.5 MCG/ACT inhaler, budesonide (PULMICORT) 0.25 MG/2ML nebulizer solution  Bronchiectasis with acute exacerbation (HCC) - Plan: budesonide-formoterol (SYMBICORT) 160-4.5 MCG/ACT inhaler, budesonide (PULMICORT) 0.25 MG/2ML nebulizer solution, methylPREDNISolone (MEDROL) 4 MG TBPK tablet   We will treat another asthma/bronchiectasis exacerbation with oral steroid taper.  We will also add on Symbicort twice daily for breathing control.  I will also send in budesonide formoterol nebulizer solution to use as needed in her nebulizer machine.  Advised patient that if her breathing does worsen in any way over  the weekend, medications are not helping her breathing to go to the emergency room right away for evaluation.  Patient is planning to follow-up with pulmonologist once some of these COVID-19 restrictions are lifted, she plans to wear a mask when she goes down to her appointment.   I discussed the assessment and treatment plan with the patient. The patient was provided an opportunity to ask questions and all were answered. The patient agreed with the plan and demonstrated an understanding of the instructions.   The patient was advised to call back or seek an in-person evaluation if the symptoms worsen or if the condition fails to improve as anticipated.  25 minutes spent over video with patient spent discussing medication options, new medications, alarm symptoms to be aware of.  Jodelle Green, FNP

## 2019-03-18 ENCOUNTER — Ambulatory Visit: Payer: 59 | Admitting: Family Medicine

## 2019-04-18 ENCOUNTER — Ambulatory Visit (INDEPENDENT_AMBULATORY_CARE_PROVIDER_SITE_OTHER): Payer: 59 | Admitting: Family Medicine

## 2019-04-18 ENCOUNTER — Telehealth: Payer: Self-pay | Admitting: Family Medicine

## 2019-04-18 ENCOUNTER — Encounter: Payer: Self-pay | Admitting: Family Medicine

## 2019-04-18 ENCOUNTER — Other Ambulatory Visit: Payer: Self-pay

## 2019-04-18 DIAGNOSIS — J4531 Mild persistent asthma with (acute) exacerbation: Secondary | ICD-10-CM

## 2019-04-18 DIAGNOSIS — J302 Other seasonal allergic rhinitis: Secondary | ICD-10-CM

## 2019-04-18 MED ORDER — MONTELUKAST SODIUM 10 MG PO TABS: 10.0000 mg | ORAL_TABLET | Freq: Every day | ORAL | 0 refills | Status: DC

## 2019-04-18 NOTE — Telephone Encounter (Signed)
Called and scheduled Pt for 06/20/2019 @ 10:00am

## 2019-04-18 NOTE — Progress Notes (Signed)
Patient ID: Karla Turner, female   DOB: June 19, 1951, 68 y.o.   MRN: [id number]    Virtual Visit via video Note  This visit type was conducted due to national recommendations for restrictions regarding the COVID-19 pandemic (e.g. social distancing).  This format is felt to be most appropriate for this patient at this time.  All issues noted in this document were discussed and addressed.  No physical exam was performed (except for noted visual exam findings with Video Visits).   I connected with Karla Turner today at 10:20 AM EDT by a video enabled telemedicine application or telephone and verified that I am speaking with the correct person using two identifiers. Location patient: home Location provider: [facility] Persons participating in the virtual visit: patient, provider  I discussed the limitations, risks, security and privacy concerns of performing an evaluation and management service by video and the availability of in person appointments. I also discussed with the patient that there may be a patient responsible charge related to this service. The patient expressed understanding and agreed to proceed.   HPI:  Patient and I connected via video to follow up on her breathing. She has been remaining home as much as possible throughout the pandemic. Has been doing a lot of yard work and this seems to have exacerbated her seasonal allergies.  Patient states since adding in the Symbicort and having the Pulmicort nebulizer solution to use as needed has been going very well, feels less congested no longer feels short of breath, cough is improved and does not wheezing.  Also believes the addition of Singulair at bedtime is helping calm down her seasonal allergy symptoms and is pleased.  While doing yard work patient was digging in the garden for many hours and believes this exacerbated some arthritis in her left thumb.  States she mentioned it to her pharmacist and they suggested she take Advil,  took a dose of Advil and the pain improved.   Review of Systems  Constitutional: Negative for chills, fatigue and fever.  HENT: Negative for congestion, ear pain, sinus pain and sore throat.   Eyes: Negative.   Respiratory: Negative for cough, shortness of breath and wheezing.   Cardiovascular: Negative for chest pain, palpitations and leg swelling.  Gastrointestinal: Negative for abdominal pain, diarrhea, nausea and vomiting.  Genitourinary: Negative for dysuria, frequency and urgency.  Musculoskeletal: Negative for arthralgias and myalgias.  Skin: Negative for color change, pallor and rash.  Neurological: Negative for syncope, light-headedness and headaches.  Psychiatric/Behavioral: The patient is not nervous/anxious.     Past Medical History:  Diagnosis Date  . Asthma   . Bronchitis   . Cataracts, bilateral   . Colon polyps   . Depression   . Diverticulitis   . GERD (gastroesophageal reflux disease)   . Glaucoma   . Hyperlipidemia   . Hypothyroidism   . Lumbar herniated disc     Past Surgical History:  Procedure Laterality Date  . BIOPSY BREAST  2019  . cataract surgery Bilateral   . COLONOSCOPY    . COLONOSCOPY WITH PROPOFOL N/A 01/29/2018   Procedure: COLONOSCOPY WITH PROPOFOL;  Surgeon: Manya Silvas, MD;  Location: [facility] ENDOSCOPY;  Service: Endoscopy;  Laterality: N/A;  . DILATION AND CURETTAGE OF UTERUS    . ESOPHAGOGASTRODUODENOSCOPY (EGD) WITH PROPOFOL N/A 01/29/2018   Procedure: ESOPHAGOGASTRODUODENOSCOPY (EGD) WITH PROPOFOL;  Surgeon: Manya Silvas, MD;  Location: [facility] ENDOSCOPY;  Service: Endoscopy;  Laterality: N/A;  . mamoplasty Bilateral  Family History  Problem Relation Age of Onset  . Breast cancer Sister   . Fibromyalgia Sister   . Allergies Son   . Asthma Son        as a child     Social History   Tobacco Use  . Smoking status: Never Smoker  . Smokeless tobacco: Never Used  Substance Use Topics  . Alcohol use: Yes    Comment:  occasionally    Current Outpatient Medications:  .  albuterol (PROVENTIL HFA;VENTOLIN HFA) 108 (90 Base) MCG/ACT inhaler, Inhale 2 puffs into the lungs every 6 (six) hours as needed for wheezing or shortness of breath., Disp: 1 Inhaler, Rfl: 5 .  ALBUTEROL IN, Inhale into the lungs., Disp: , Rfl:  .  brinzolamide (AZOPT) 1 % ophthalmic suspension, Place 1 drop into the right eye 3 (three) times daily., Disp: , Rfl:  .  budesonide (PULMICORT) 0.25 MG/2ML nebulizer solution, Take 2 mLs (0.25 mg total) by nebulization 2 (two) times daily as needed., Disp: 60 mL, Rfl: 5 .  budesonide-formoterol (SYMBICORT) 160-4.5 MCG/ACT inhaler, Inhale 2 puffs into the lungs 2 (two) times daily., Disp: 1 Inhaler, Rfl: 2 .  methazolamide (NEPTAZANE) 25 MG tablet, Take 25 mg by mouth 3 (three) times daily., Disp: , Rfl:  .  methylPREDNISolone (MEDROL) 4 MG TBPK tablet, Take according to pack instructions, Disp: 21 tablet, Rfl: 0 .  montelukast (SINGULAIR) 10 MG tablet, Take 1 tablet (10 mg total) by mouth at bedtime., Disp: 30 tablet, Rfl: 2 .  omeprazole (PRILOSEC OTC) 20 MG tablet, Take by mouth., Disp: , Rfl:  .  pantoprazole (PROTONIX) 40 MG tablet, Take 40 mg by mouth daily., Disp: , Rfl:  .  TIROSINT 100 MCG CAPS, TAKE ONE CAPSULE BY MOUTH EVERY MORNING ON AN EMPTY STOMACH, Disp: 90 capsule, Rfl: 10  Current Facility-Administered Medications:  .  methylPREDNISolone acetate (DEPO-MEDROL) injection 40 mg, 40 mg, Intramuscular, Once, Jakobe Blau, Jacquelynn Cree, FNP  EXAM:  GENERAL: alert, oriented, appears well and in no acute distress  HEENT: atraumatic, conjunttiva clear, no obvious abnormalities on inspection of external nose and ears  NECK: normal movements of the head and neck  LUNGS: on inspection no signs of respiratory distress, breathing rate appears normal, no obvious gross SOB, gasping or wheezing.  When speaking she sounds most less congested than previous visits and is no longer coughing.  CV: no obvious  cyanosis  MS: moves all visible extremities without noticeable abnormality  PSYCH/NEURO: pleasant and cooperative, no obvious depression or anxiety, speech and thought processing grossly intact  ASSESSMENT AND PLAN:  Discussed the following assessment and plan:  1. Mild persistent asthmatic bronchitis with acute exacerbation  - montelukast (SINGULAIR) 10 MG tablet; Take 1 tablet (10 mg total) by mouth at bedtime.  Dispense: 90 tablet; Refill: 0  2. Seasonal allergies  - montelukast (SINGULAIR) 10 MG tablet; Take 1 tablet (10 mg total) by mouth at bedtime.  Dispense: 90 tablet; Refill: 0  Patient will continue with the addition of Symbicort to her bleeding medication regimen and also use Pulmicort nebulizer solution as needed.  She will continue Singulair at bedtime to help better control seasonal allergies.  Continue to recommend patient be diligent with her handwashing mask whenever she is out of the home and continue to remain home as much as possible due to her being a high risk person to become very ill if she were to contract COVID-19.   I discussed the assessment and treatment plan with the  patient. The patient was provided an opportunity to ask questions and all were answered. The patient agreed with the plan and demonstrated an understanding of the instructions.   The patient was advised to call back or seek an in-person evaluation if the symptoms worsen or if the condition fails to improve as anticipated.   Patient will otherwise follow-up here in about 2 months for continued monitoring and management of chronic conditions and she will call us sooner if any issues arise.  Jodelle Green, FNP

## 2019-04-18 NOTE — Telephone Encounter (Signed)
Please call to schedule 2 month follow up  Thanks  LG

## 2019-05-13 ENCOUNTER — Other Ambulatory Visit: Payer: Self-pay | Admitting: Family Medicine

## 2019-05-13 DIAGNOSIS — J449 Chronic obstructive pulmonary disease, unspecified: Secondary | ICD-10-CM

## 2019-05-13 DIAGNOSIS — R0989 Other specified symptoms and signs involving the circulatory and respiratory systems: Secondary | ICD-10-CM

## 2019-05-13 DIAGNOSIS — R062 Wheezing: Secondary | ICD-10-CM

## 2019-05-15 ENCOUNTER — Other Ambulatory Visit: Payer: Self-pay | Admitting: Family Medicine

## 2019-05-15 DIAGNOSIS — R0989 Other specified symptoms and signs involving the circulatory and respiratory systems: Secondary | ICD-10-CM

## 2019-05-15 DIAGNOSIS — J449 Chronic obstructive pulmonary disease, unspecified: Secondary | ICD-10-CM

## 2019-05-15 DIAGNOSIS — R062 Wheezing: Secondary | ICD-10-CM

## 2019-05-28 ENCOUNTER — Encounter: Payer: Self-pay | Admitting: Family Medicine

## 2019-05-28 ENCOUNTER — Other Ambulatory Visit: Payer: Self-pay | Admitting: Lab

## 2019-05-28 DIAGNOSIS — J4531 Mild persistent asthma with (acute) exacerbation: Secondary | ICD-10-CM

## 2019-05-28 DIAGNOSIS — J449 Chronic obstructive pulmonary disease, unspecified: Secondary | ICD-10-CM

## 2019-05-28 MED ORDER — ALBUTEROL SULFATE (2.5 MG/3ML) 0.083% IN NEBU: 2.5000 mg | INHALATION_SOLUTION | Freq: Four times a day (QID) | RESPIRATORY_TRACT | 1 refills | Status: DC | PRN

## 2019-05-28 NOTE — Telephone Encounter (Signed)
Good afternoon... I need to order (1) the Albuterol Sulfate - Inhalation Solution for the nebulizer but unable to do this with the pharmacist or on this site. Currently, I'm using Budesonide inhalation 2x a day and would like to go to 1x a day of each medicine due to eye pressure. When I cut back to 1x a day of the Budesonide, my breathing starts to get worse again.  (2) I also think I have developed mouth thrush due to the inhalers and nebulizer treatments, even though I gargle and brush after each use.  Please let me know if you can help me.   Thank you!!   If you need to call me, the best number is: [phone].

## 2019-05-29 MED ORDER — FLUTICASONE FUROATE-VILANTEROL 100-25 MCG/INH IN AEPB: 1.0000 | INHALATION_SPRAY | Freq: Every day | RESPIRATORY_TRACT | 3 refills | Status: DC

## 2019-05-30 ENCOUNTER — Telehealth: Payer: Self-pay | Admitting: Pulmonary Disease

## 2019-05-30 NOTE — Telephone Encounter (Signed)
Called and spoke with patient. Patient is concerned about PFT being scheduled. PFT was ordered 01/08/19, by BQ.  Explained PFT recently started back, due to covid. Patient is also concerned with safety in the office with covid.  Explained covid testing before PFT, covid screen before OV and at door, mask requirements, Patient only at [place], checking temps, and no waiting in lobby, if possible.  Understanding stated. Explained Sharl Ma is calling and scheduling PFT's and due to covid cleaning, there is a limited amount daily.  Understanding stated. Nothing further at this time.  Message routed to [place], as FYI about PFT

## 2019-06-07 NOTE — Telephone Encounter (Signed)
[place] working on scheduling

## 2019-06-09 ENCOUNTER — Other Ambulatory Visit: Payer: Self-pay | Admitting: Family Medicine

## 2019-06-09 DIAGNOSIS — J302 Other seasonal allergic rhinitis: Secondary | ICD-10-CM

## 2019-06-09 DIAGNOSIS — J4531 Mild persistent asthma with (acute) exacerbation: Secondary | ICD-10-CM

## 2019-06-10 NOTE — Telephone Encounter (Signed)
Called and scheduled pft for 06/28/2019 -pr

## 2019-06-12 ENCOUNTER — Other Ambulatory Visit: Payer: Self-pay | Admitting: Family Medicine

## 2019-06-12 DIAGNOSIS — J471 Bronchiectasis with (acute) exacerbation: Secondary | ICD-10-CM

## 2019-06-12 DIAGNOSIS — J4531 Mild persistent asthma with (acute) exacerbation: Secondary | ICD-10-CM

## 2019-06-17 ENCOUNTER — Other Ambulatory Visit: Payer: Self-pay | Admitting: Pulmonary Disease

## 2019-06-20 ENCOUNTER — Ambulatory Visit: Payer: 59 | Admitting: Family Medicine

## 2019-06-24 ENCOUNTER — Other Ambulatory Visit
Admission: RE | Admit: 2019-06-24 | Discharge: 2019-06-24 | Disposition: A | Discharge status: Home / Self Care | Payer: 59 | Source: Ambulatory Visit | Attending: Family Medicine | Admitting: Family Medicine

## 2019-06-24 ENCOUNTER — Other Ambulatory Visit: Payer: Self-pay

## 2019-06-24 DIAGNOSIS — Z01812 Encounter for preprocedural laboratory examination: Secondary | ICD-10-CM | POA: Insufficient documentation

## 2019-06-24 DIAGNOSIS — Z20828 Contact with and (suspected) exposure to other viral communicable diseases: Secondary | ICD-10-CM | POA: Diagnosis not present

## 2019-06-25 LAB — SARS CORONAVIRUS 2 (TAT 6-24 HRS): SARS Coronavirus 2: NEGATIVE

## 2019-06-28 ENCOUNTER — Ambulatory Visit (INDEPENDENT_AMBULATORY_CARE_PROVIDER_SITE_OTHER): Payer: 59 | Admitting: Pulmonary Disease

## 2019-06-28 ENCOUNTER — Encounter: Payer: Self-pay | Admitting: Primary Care

## 2019-06-28 ENCOUNTER — Ambulatory Visit (INDEPENDENT_AMBULATORY_CARE_PROVIDER_SITE_OTHER): Payer: 59 | Admitting: Primary Care

## 2019-06-28 ENCOUNTER — Other Ambulatory Visit: Payer: Self-pay

## 2019-06-28 VITALS — BP 124/70 | HR 75 | Temp 98.3°F | Ht 63.0 in | Wt 213.0 lb

## 2019-06-28 DIAGNOSIS — J4520 Mild intermittent asthma, uncomplicated: Secondary | ICD-10-CM

## 2019-06-28 DIAGNOSIS — R059 Cough, unspecified: Secondary | ICD-10-CM

## 2019-06-28 DIAGNOSIS — IMO0001 Reserved for inherently not codable concepts without codable children: Secondary | ICD-10-CM

## 2019-06-28 DIAGNOSIS — R6889 Other general symptoms and signs: Secondary | ICD-10-CM

## 2019-06-28 DIAGNOSIS — R05 Cough: Secondary | ICD-10-CM

## 2019-06-28 LAB — PULMONARY FUNCTION TEST
DL/VA % pred: 135 %
DL/VA: 5.68 ml/min/mmHg/L
DLCO unc % pred: 98 %
DLCO unc: 18.74 ml/min/mmHg
FEF 25-75 Post: 1.38 L/sec
FEF 25-75 Pre: 1.08 L/sec
FEF2575-%Change-Post: 27 %
FEF2575-%Pred-Post: 68 %
FEF2575-%Pred-Pre: 53 %
FEV1-%Change-Post: 4 %
FEV1-%Pred-Post: 61 %
FEV1-%Pred-Pre: 59 %
FEV1-Post: 1.41 L
FEV1-Pre: 1.36 L
FEV1FVC-%Change-Post: -2 %
FEV1FVC-%Pred-Pre: 100 %
FEV6-%Change-Post: 4 %
FEV6-%Pred-Post: 63 %
FEV6-%Pred-Pre: 60 %
FEV6-Post: 1.84 L
FEV6-Pre: 1.76 L
FEV6FVC-%Change-Post: -1 %
FEV6FVC-%Pred-Post: 102 %
FEV6FVC-%Pred-Pre: 104 %
FVC-%Change-Post: 6 %
FVC-%Pred-Post: 62 %
FVC-%Pred-Pre: 58 %
FVC-Post: 1.87 L
FVC-Pre: 1.76 L
Post FEV1/FVC ratio: 76 %
Post FEV6/FVC ratio: 98 %
Pre FEV1/FVC ratio: 77 %
Pre FEV6/FVC Ratio: 100 %
RV % pred: 84 %
RV: 1.75 L
TLC % pred: 80 %
TLC: 3.92 L

## 2019-06-28 NOTE — Assessment & Plan Note (Addendum)
-   Moderate restriction on PFT with no evidence of obstruction. No significant BD response. Normal DLCO - Clinical symptoms consistent with moderate intermittent asthma - Continue Breo 1 puff daily; prn albuterol q 6 hours  - Follow up in 6 months OR sooner if needed/experiencing freq exacerbations

## 2019-06-28 NOTE — Progress Notes (Signed)
@Patient  ID: Karla Turner, female    DOB: 1951/09/23, 68 y.o.   MRN: [id number]  Chief Complaint  Patient presents with  . Shortness of Breath    with rare wheezing, but has gotten better after using the inhaler / nebulizer issued.  . Results    Discuss results of PFT     Referring provider: Jodelle Green, FNP  HPI: 68 year old female, never smoked. PMH significant for asthma, bronchiectasis, COPD, obesity, glaucoma, positive ANA, dyslipidemia, GERD. Patient of Dr. Lake Bells, last seen for initial consult on 01/08/19. Ordered for PFTs, labs to assess for immunodeficiency and alpha-1 antitrypsin. Labs were normal. Anoro stopped. FENO normal. No other inhalers indicated at this time.  Previous [facility] pulmonary encounter: 3/3/20Lake Bells, MD This is a pleasant 68 year old female who comes to my clinic today to establish care for repeated episodes of bronchitis ever since having childhood pneumonia.  She has increased airway caliber on my review of her CT chest consistent with bronchiectasis but this does seem more like a smooth cylindrical bronchiectasis rather than a cystic or mucopurulent variant.  I wonder about the possibility of concomitant asthma and I also wonder about some problems with her immune system such as immunoglobulin deficiency or an underlying alpha-1 antitrypsin deficiency.  06/28/2019 Patient presents today for follow-up with PFTs. States that she is doing well, her breathing has improved since being on Breo. She is using Breo 1 puff daily and albuterol nebulizer in the evening. Asthma currently being managed by her PCP. States that she requires frequent prednisone and antibiotics for bronchitis. Thinks she may have been exposed to covid in [place] back in [place].    06/28/2019  PFTS- FVC 1.87 (62%), FEV1 1.41 (61%), ratio 76, TLC 80, normal DLCO   Chest imaging: November 2019 CT chest images independently reviewed showing no pulmonary parenchymal abnormality with  the exception of increased airway caliber specifically in the lower lobes bilaterally but no mucus plugging, no pulmonary parenchymal infiltrate, no masses.  FENO: 01/2019 11 ppb  Allergies  Allergen Reactions  . White Petrolatum     Other reaction(s): Other (See Comments) Pt states that she used Invisalign (which she states is a petroleum product and afterward developed systemic "poisoning."    Immunization History  Administered Date(s) Administered  . Influenza, High Dose Seasonal PF 09/07/2018  . Influenza-Unspecified 09/05/2018  . Pneumococcal Conjugate-13 09/07/2018  . Pneumococcal-Unspecified 09/09/2014    Past Medical History:  Diagnosis Date  . Asthma   . Bronchitis   . Cataracts, bilateral   . Colon polyps   . Depression   . Diverticulitis   . GERD (gastroesophageal reflux disease)   . Glaucoma   . Hyperlipidemia   . Hypothyroidism   . Lumbar herniated disc     Tobacco History: Social History   Tobacco Use  Smoking Status Never Smoker  Smokeless Tobacco Never Used   Counseling given: Not Answered   Outpatient Medications Prior to Visit  Medication Sig Dispense Refill  . albuterol (PROVENTIL) (2.5 MG/3ML) 0.083% nebulizer solution Take 3 mLs (2.5 mg total) by nebulization every 6 (six) hours as needed for wheezing or shortness of breath. 150 mL 1  . albuterol (VENTOLIN HFA) 108 (90 Base) MCG/ACT inhaler Inhale 2 puffs into the lungs every 6 (six) hours as needed for wheezing or shortness of breath.    . brinzolamide (AZOPT) 1 % ophthalmic suspension Place 1 drop into the right eye 3 (three) times daily.    . budesonide (PULMICORT)  0.25 MG/2ML nebulizer solution Take 2 mLs (0.25 mg total) by nebulization 2 (two) times daily as needed. 60 mL 5  . fluticasone furoate-vilanterol (BREO ELLIPTA) 100-25 MCG/INH AEPB Inhale 1 puff into the lungs daily. 1 each 3  . methazolamide (NEPTAZANE) 25 MG tablet Take 25 mg by mouth 3 (three) times daily.    . montelukast  (SINGULAIR) 10 MG tablet Take 1 tablet (10 mg total) by mouth at bedtime. 90 tablet 0  . omeprazole (PRILOSEC OTC) 20 MG tablet Take by mouth.    . TIROSINT 100 MCG CAPS TAKE ONE CAPSULE BY MOUTH EVERY MORNING ON AN EMPTY STOMACH 90 capsule 10  . albuterol (PROVENTIL HFA;VENTOLIN HFA) 108 (90 Base) MCG/ACT inhaler Inhale 2 puffs into the lungs every 6 (six) hours as needed for wheezing or shortness of breath. 1 Inhaler 5  . ALBUTEROL IN Inhale into the lungs.    . methylPREDNISolone (MEDROL) 4 MG TBPK tablet Take according to pack instructions 21 tablet 0  . pantoprazole (PROTONIX) 40 MG tablet Take 40 mg by mouth daily.     Facility-Administered Medications Prior to Visit  Medication Dose Route Frequency Provider Last Rate Last Dose  . methylPREDNISolone acetate (DEPO-MEDROL) injection 40 mg  40 mg Intramuscular Once Guse, Jacquelynn Cree, FNP        Review of Systems  Review of Systems  Constitutional: Negative.   Respiratory: Positive for shortness of breath and wheezing. Negative for cough.   Cardiovascular: Negative.     Physical Exam  BP 124/70 (BP Location: Left Arm, Patient Position: Sitting, Cuff Size: Normal)   Pulse 75   Temp 98.3 F (36.8 C)   Ht 5\' 3"  (1.6 m)   Wt 213 lb (96.6 kg)   SpO2 97%   BMI 37.73 kg/m  Physical Exam Constitutional:      Appearance: She is well-developed. She is not ill-appearing.  HENT:     Head: Normocephalic and atraumatic.     Mouth/Throat:     Mouth: Mucous membranes are moist.     Pharynx: Oropharynx is clear.  Eyes:     Extraocular Movements: Extraocular movements intact.     Pupils: Pupils are equal, round, and reactive to light.  Cardiovascular:     Rate and Rhythm: Normal rate and regular rhythm.  Pulmonary:     Effort: Pulmonary effort is normal.     Breath sounds: Normal breath sounds. No decreased breath sounds or wheezing.  Musculoskeletal: Normal range of motion.  Skin:    General: Skin is warm and dry.  Neurological:      General: No focal deficit present.     Mental Status: She is alert and oriented to person, place, and time.  Psychiatric:        Mood and Affect: Mood normal.        Behavior: Behavior normal.      Lab Results:  CBC    Component Value Date/Time   WBC 6.1 08/24/2018 1111   WBC 10.5 07/20/2017 0827   RBC 4.59 08/24/2018 1111   RBC 4.48 07/20/2017 0827   HGB 13.8 08/24/2018 1111   HCT 41.8 08/24/2018 1111   PLT 218 08/24/2018 1111   MCV 91 08/24/2018 1111   MCV 88 02/21/2013 2153   MCH 30.1 08/24/2018 1111   MCH 31.1 07/20/2017 0827   MCHC 33.0 08/24/2018 1111   MCHC 34.6 07/20/2017 0827   RDW 12.3 08/24/2018 1111   RDW 12.4 02/21/2013 2153   LYMPHSABS 2.0 08/24/2018 1111  EOSABS 0.1 08/24/2018 1111   BASOSABS 0.1 08/24/2018 1111    BMET    Component Value Date/Time   NA 142 08/24/2018 1111   NA 139 02/21/2013 2153   K 4.3 08/24/2018 1111   K 3.7 02/21/2013 2153   CL 103 08/24/2018 1111   CL 108 (H) 02/21/2013 2153   CO2 23 08/24/2018 1111   CO2 27 02/21/2013 2153   GLUCOSE 91 08/24/2018 1111   GLUCOSE 109 (H) 07/20/2017 0827   GLUCOSE 135 (H) 02/21/2013 2153   BUN 16 08/24/2018 1111   BUN 12 02/21/2013 2153   CREATININE 0.82 08/24/2018 1111   CREATININE 0.67 02/21/2013 2153   CALCIUM 9.0 08/24/2018 1111   CALCIUM 8.5 02/21/2013 2153   GFRNONAA 75 08/24/2018 1111   GFRNONAA >60 02/21/2013 2153   GFRAA 87 08/24/2018 1111   GFRAA >60 02/21/2013 2153    BNP No results found for: BNP  ProBNP No results found for: PROBNP  Imaging: No results found.   Assessment & Plan:   Asthma - Moderate restriction on PFT with no evidence of obstruction. No significant BD response. Normal DLCO - Clinical symptoms consistent with moderate intermittent asthma - Continue Breo 1 puff daily; prn albuterol q 6 hours  - Follow up in 6 months OR sooner if needed/experiencing freq exacerbations    [name], NP 06/28/2019

## 2019-06-28 NOTE — Patient Instructions (Signed)
Office testing: PFT showed evidence of moderate restriction and air trapping consistent with asthma No evidence of obstructive lung disease, diffusion capacity normal  Orders: Covid antibody  Recommendations: Continue Breo 1 puff daily Use albuterol every 6 hours as needed for breakthrough shortness of breath/wheezing  Follow-up 6 months with Dr. Lake Bells or Eustaquio Maize NP  If you experience >3 flares requiring steroids please return to office

## 2019-06-28 NOTE — Progress Notes (Signed)
PFT done today. 

## 2019-06-29 LAB — SAR COV2 SEROLOGY (COVID19)AB(IGG),IA: SARS CoV2 AB IGG: NEGATIVE

## 2019-06-30 NOTE — Progress Notes (Signed)
Reviewed, agree 

## 2019-07-01 NOTE — Progress Notes (Signed)
Let patient know covid antibody was negative. Does not appear she has had the virus.

## 2019-07-05 ENCOUNTER — Ambulatory Visit: Payer: 59 | Admitting: Family Medicine

## 2019-07-15 ENCOUNTER — Other Ambulatory Visit: Payer: Self-pay | Admitting: Family Medicine

## 2019-07-15 DIAGNOSIS — J471 Bronchiectasis with (acute) exacerbation: Secondary | ICD-10-CM

## 2019-07-15 DIAGNOSIS — J4531 Mild persistent asthma with (acute) exacerbation: Secondary | ICD-10-CM

## 2019-07-16 DIAGNOSIS — H4051X1 Glaucoma secondary to other eye disorders, right eye, mild stage: Secondary | ICD-10-CM | POA: Diagnosis not present

## 2019-08-02 ENCOUNTER — Other Ambulatory Visit: Payer: Self-pay | Admitting: Internal Medicine

## 2019-08-11 ENCOUNTER — Other Ambulatory Visit: Payer: Self-pay | Admitting: Family Medicine

## 2019-08-11 DIAGNOSIS — J302 Other seasonal allergic rhinitis: Secondary | ICD-10-CM

## 2019-08-11 DIAGNOSIS — J4531 Mild persistent asthma with (acute) exacerbation: Secondary | ICD-10-CM

## 2019-08-21 ENCOUNTER — Other Ambulatory Visit: Payer: Self-pay | Admitting: Internal Medicine

## 2019-08-21 ENCOUNTER — Other Ambulatory Visit: Payer: Self-pay | Admitting: Family Medicine

## 2019-08-21 NOTE — Telephone Encounter (Signed)
Requested medication (s) are due for refill today: yes  Requested medication (s) are on the active medication list: yes  Last refill:  01/31/2018  Future visit scheduled: yes  Notes to clinic:  Last filled by historical provider    Requested Prescriptions  Pending Prescriptions Disp Refills   omeprazole (PRILOSEC OTC) 20 MG tablet       Sig: Take by mouth.     Gastroenterology: Proton Pump Inhibitors Passed - 08/21/2019 11:56 AM      Passed - Valid encounter within last 12 months    Recent Outpatient Visits          4 months ago Mild persistent asthmatic bronchitis with acute exacerbation   [facility] [place] Guse, Jacquelynn Cree, FNP   5 months ago Mild persistent asthmatic bronchitis with acute exacerbation   [facility] [place] Guse, Jacquelynn Cree, FNP   5 months ago Mild persistent asthmatic bronchitis with acute exacerbation   [facility] [place] Guse, Jacquelynn Cree, FNP   6 months ago Mild persistent asthmatic bronchitis with acute exacerbation   [facility] [place] Guse, Jacquelynn Cree, FNP   8 months ago Cough   [place] Guse, Jacquelynn Cree, FNP      Future Appointments            In 6 days Guse, Jacquelynn Cree, [place], [place]   In 4 months [name], NP [facility]            Levothyroxine Sodium (TIROSINT) 100 MCG CAPS 90 capsule 10     Endocrinology:  Hypothyroid Agents Failed - 08/21/2019 11:56 AM      Failed - TSH needs to be rechecked within 3 months after an abnormal result. Refill until TSH is due.      Failed - TSH in normal range and within 360 days    TSH  Date Value Ref Range Status  08/24/2018 1.240 0.450 - 4.500 uIU/mL Final         Passed - Valid encounter within last 12 months    Recent Outpatient Visits          4 months ago Mild persistent asthmatic bronchitis with acute exacerbation   [facility] [place] Guse, Jacquelynn Cree, FNP   5 months  ago Mild persistent asthmatic bronchitis with acute exacerbation   [facility] [place] Guse, Jacquelynn Cree, FNP   5 months ago Mild persistent asthmatic bronchitis with acute exacerbation   [facility] [place] Guse, Jacquelynn Cree, FNP   6 months ago Mild persistent asthmatic bronchitis with acute exacerbation   [facility] [place] Guse, Jacquelynn Cree, FNP   8 months ago Cough   [place] Guse, Jacquelynn Cree, FNP      Future Appointments            In 6 days Guse, Jacquelynn Cree, [place], [place]   In 4 months [name], NP [facility]

## 2019-08-21 NOTE — Telephone Encounter (Signed)
Patient states that these prescriptions were from a previous provider and she is now running out of these and will need new prescriptions. Please advise.  Medication Refill - Medication: TIROSINT 100 MCG CAPS omeprazole (PRILOSEC OTC) 20 MG tablet   Has the patient contacted their pharmacy? Yes.   (Agent: If no, request that the patient contact the pharmacy for the refill.) (Agent: If yes, when and what did the pharmacy advise?)  Preferred Pharmacy (with phone number or street name): [place], [place]  Agent: Please be advised that RX refills may take up to 3 business days. We ask that you follow-up with your pharmacy.

## 2019-08-22 ENCOUNTER — Other Ambulatory Visit: Payer: Self-pay | Admitting: Lab

## 2019-08-22 MED ORDER — TIROSINT 100 MCG PO CAPS: ORAL_CAPSULE | ORAL | 10 refills | Status: DC

## 2019-08-27 ENCOUNTER — Other Ambulatory Visit: Payer: Self-pay

## 2019-08-27 ENCOUNTER — Ambulatory Visit (INDEPENDENT_AMBULATORY_CARE_PROVIDER_SITE_OTHER): Payer: 59 | Admitting: Family Medicine

## 2019-08-27 ENCOUNTER — Encounter: Payer: Self-pay | Admitting: Family Medicine

## 2019-08-27 VITALS — BP 116/78 | HR 71 | Temp 97.6°F | Resp 15 | Ht 63.0 in | Wt 210.0 lb

## 2019-08-27 DIAGNOSIS — K219 Gastro-esophageal reflux disease without esophagitis: Secondary | ICD-10-CM

## 2019-08-27 DIAGNOSIS — Z1231 Encounter for screening mammogram for malignant neoplasm of breast: Secondary | ICD-10-CM | POA: Diagnosis not present

## 2019-08-27 DIAGNOSIS — N83201 Unspecified ovarian cyst, right side: Secondary | ICD-10-CM

## 2019-08-27 DIAGNOSIS — J449 Chronic obstructive pulmonary disease, unspecified: Secondary | ICD-10-CM

## 2019-08-27 DIAGNOSIS — Z23 Encounter for immunization: Secondary | ICD-10-CM | POA: Diagnosis not present

## 2019-08-27 DIAGNOSIS — Z780 Asymptomatic menopausal state: Secondary | ICD-10-CM | POA: Diagnosis not present

## 2019-08-27 DIAGNOSIS — Z0000 Encounter for general adult medical examination without abnormal findings: Secondary | ICD-10-CM

## 2019-08-27 LAB — COMPREHENSIVE METABOLIC PANEL
ALT: 15 U/L (ref 0–35)
AST: 16 U/L (ref 0–37)
Albumin: 4.6 g/dL (ref 3.5–5.2)
Alkaline Phosphatase: 79 U/L (ref 39–117)
BUN: 12 mg/dL (ref 6–23)
CO2: 29 mEq/L (ref 19–32)
Calcium: 9.2 mg/dL (ref 8.4–10.5)
Chloride: 103 mEq/L (ref 96–112)
Creatinine, Ser: 0.79 mg/dL (ref 0.40–1.20)
GFR: 72.64 mL/min (ref >60.00)
Glucose, Bld: 98 mg/dL (ref 70–99)
Potassium: 4 mEq/L (ref 3.5–5.1)
Sodium: 139 mEq/L (ref 135–145)
Total Bilirubin: 0.6 mg/dL (ref 0.2–1.2)
Total Protein: 6.9 g/dL (ref 6.0–8.3)

## 2019-08-27 LAB — CBC WITH DIFFERENTIAL/PLATELET
Basophils Absolute: 0.1 10*3/uL (ref 0.0–0.1)
Basophils Relative: 1.2 % (ref 0.0–3.0)
Eosinophils Absolute: 0.1 10*3/uL (ref 0.0–0.7)
Eosinophils Relative: 1.8 % (ref 0.0–5.0)
HCT: 43.8 % (ref 36.0–46.0)
Hemoglobin: 14.4 g/dL (ref 12.0–15.0)
Lymphocytes Relative: 22.8 % (ref 12.0–46.0)
Lymphs Abs: 1.3 10*3/uL (ref 0.7–4.0)
MCHC: 32.9 g/dL (ref 30.0–36.0)
MCV: 91.9 fl (ref 78.0–100.0)
Monocytes Absolute: 0.5 10*3/uL (ref 0.1–1.0)
Monocytes Relative: 9.3 % (ref 3.0–12.0)
Neutro Abs: 3.8 10*3/uL (ref 1.4–7.7)
Neutrophils Relative %: 64.9 % (ref 43.0–77.0)
Platelets: 221 10*3/uL (ref 150.0–400.0)
RBC: 4.76 Mil/uL (ref 3.87–5.11)
RDW: 13.5 % (ref 11.5–15.5)
WBC: 5.9 10*3/uL (ref 4.0–10.5)

## 2019-08-27 LAB — TSH: TSH: 0.33 u[IU]/mL — ABNORMAL LOW (ref 0.35–4.50)

## 2019-08-27 LAB — LIPID PANEL
Cholesterol: 236 mg/dL — ABNORMAL HIGH (ref 0–200)
HDL: 54.7 mg/dL (ref >39.00)
LDL Cholesterol: 151 mg/dL — ABNORMAL HIGH (ref 0–99)
NonHDL: 181.58
Total CHOL/HDL Ratio: 4
Triglycerides: 152 mg/dL — ABNORMAL HIGH (ref 0.0–149.0)
VLDL: 30.4 mg/dL (ref 0.0–40.0)

## 2019-08-27 MED ORDER — OMEPRAZOLE MAGNESIUM 20 MG PO TBEC: 20.0000 mg | DELAYED_RELEASE_TABLET | Freq: Every day | ORAL | 3 refills | Status: DC

## 2019-08-27 NOTE — Progress Notes (Signed)
Subjective:    Patient ID: Karla Turner, female    DOB: [birth year], 68 y.o.   MRN: [id number]  HPI   Patient presents to clinic for complete physical exam.  Overall she has been doing well.  She has mainly been staying home and working from home, but did go to [employer] for short time and does go on occasion but tries to keep her self distance from others and always wears her mask and is diligent with her handwashing.  Breathing has been stable.  Does see eye doctor regularly due to glaucoma issues, does see dentist at least twice per year.  Is due for mammogram and bone density.  She has aged out of Pap smear, but does report a history of a calcified cyst on ovary, would like that followed up with some imaging if possible.  Denies any pelvic pain, vaginal discharge or vaginal bleeding.  Does need a refill on her omeprazole for GERD.  Patient Active Problem List   Diagnosis Date Noted  . Bronchiectasis with acute exacerbation ([place]) 03/15/2019  . History of diverticulitis 11/16/2018  . Dyslipidemia 11/16/2018  . Primary open angle glaucoma (POAG) of both eyes, mild stage 09/17/2018  . Positive ANA (antinuclear antibody) 09/17/2018  . Colon polyp 12/18/2017  . Shortness of breath 12/18/2017  . Vitamin B12 deficiency anemia 12/18/2017  . Asthma 11/17/2017  . GERD (gastroesophageal reflux disease) 11/17/2017  . Hx of adenomatous colonic polyps 11/17/2017  . Obesity (BMI 35.0-39.9 without comorbidity) 11/17/2017  . Pelvic mass in female 01/10/2017  . Varicose veins 07/24/2015  . Glaucoma suspect of right eye 04/15/2014  . Infection and inflammatory reaction due to other internal prosthetic devices, implants and grafts, initial encounter ([place]) 04/15/2014  . Pseudophakia of both eyes 04/15/2014  . Cystoid macular edema 03/18/2013  . Epiretinal membrane 03/18/2013   Social History   Tobacco Use  . Smoking status: Never Smoker  . Smokeless tobacco: Never Used  Substance  Use Topics  . Alcohol use: Yes    Comment: occasionally   Review of Systems  Constitutional: Negative for chills, fatigue and fever.  HENT: Negative for congestion, ear pain, sinus pain and sore throat.   Eyes: Negative.   Respiratory: Negative for cough, shortness of breath and wheezing.   Cardiovascular: Negative for chest pain, palpitations and leg swelling.  Gastrointestinal: Negative for abdominal pain, diarrhea, nausea and vomiting.  Genitourinary: Negative for dysuria, frequency and urgency.  Musculoskeletal: Negative for arthralgias and myalgias.  Skin: Negative for color change, pallor and rash.  Neurological: Negative for syncope, light-headedness and headaches.  Psychiatric/Behavioral: The patient is not nervous/anxious.       Objective:   Physical Exam Vitals signs and nursing note reviewed.  Constitutional:      General: She is not in acute distress.    Appearance: She is not ill-appearing, toxic-appearing or diaphoretic.  HENT:     Head: Normocephalic and atraumatic.  Eyes:     General: No scleral icterus.    Conjunctiva/sclera: Conjunctivae normal.  Neck:     Musculoskeletal: Normal range of motion and neck supple. No neck rigidity.     Vascular: No carotid bruit.  Cardiovascular:     Rate and Rhythm: Normal rate and regular rhythm.  Pulmonary:     Effort: Pulmonary effort is normal. No respiratory distress.     Breath sounds: Normal breath sounds.  Chest:     Breasts:        Right: Normal. No swelling,  bleeding, inverted nipple, mass, nipple discharge, skin change or tenderness.        Left: Normal. No swelling, bleeding, inverted nipple, mass, nipple discharge, skin change or tenderness.  Musculoskeletal: Normal range of motion.     Right lower leg: No edema.     Left lower leg: No edema.  Lymphadenopathy:     Cervical: No cervical adenopathy.     Upper Body:     Right upper body: No supraclavicular, axillary or pectoral adenopathy.     Left upper  body: No supraclavicular, axillary or pectoral adenopathy.  Skin:    General: Skin is warm and dry.     Capillary Refill: Capillary refill takes less than 2 seconds.     Coloration: Skin is not jaundiced or pale.  Neurological:     General: No focal deficit present.     Mental Status: She is alert and oriented to person, place, and time.     Cranial Nerves: No cranial nerve deficit.     Motor: No weakness.     Gait: Gait normal.  Psychiatric:        Mood and Affect: Mood normal.        Behavior: Behavior normal.    Today's Vitals   08/27/19 0831  BP: 116/78  Pulse: 71  Resp: 15  Temp: 97.6 F (36.4 C)  TempSrc: Temporal  SpO2: 97%  Weight: 210 lb (95.3 kg)  Height: _0  (1.6 m)   Body mass index is 37.2 kg/m.     Assessment & Plan:   Overall patient doing well.  Reviewed healthy diet and regular physical activity including walking and lifting small weights just to keep body physically fit.  Reviewed balanced meals with lean proteins, lots of vegetables and whole grains.  Encouraged good water intake daily.  We will get annual blood work today in clinic  Flu vaccine given  Mammogram ordered, bone density scan ordered.  Also place referral for ultrasound to follow-up on history of cyst on ovary.  Patient does see eye doctor and dentist regularly.  She always wears seatbelt when in car.  Reviewed system practices including wearing SPF of at least 27 when outdoors.  Refill of omeprazole given for GERD control.  Breathing is stable on current medications and she does follow regularly with pulmonology.  Well adult health check - Plan: CBC w/Diff, Comp Met (CMET), TSH, Lipid panel  Post-menopausal - Plan: DG Bone Density  Visit for screening mammogram - Plan: MM Digital Screening  Cyst of right ovary - Plan: US Pelvis Complete  Gastroesophageal reflux disease without esophagitis - Plan: omeprazole (PRILOSEC OTC) 20 MG tablet  Need for immunization against influenza  - Plan: Flu Vaccine QUAD High Dose(Fluad)  Chronic obstructive pulmonary disease, unspecified COPD type ([place])

## 2019-09-03 ENCOUNTER — Encounter: Payer: Self-pay | Admitting: Family Medicine

## 2019-09-11 ENCOUNTER — Ambulatory Visit
Admission: RE | Admit: 2019-09-11 | Discharge: 2019-09-11 | Disposition: A | Discharge status: Home / Self Care | Payer: 59 | Source: Ambulatory Visit | Attending: Family Medicine | Admitting: Family Medicine

## 2019-09-11 ENCOUNTER — Other Ambulatory Visit: Payer: Self-pay

## 2019-09-11 ENCOUNTER — Other Ambulatory Visit: Payer: Self-pay | Admitting: Family Medicine

## 2019-09-11 ENCOUNTER — Telehealth: Payer: Self-pay | Admitting: *Deleted

## 2019-09-11 DIAGNOSIS — N83201 Unspecified ovarian cyst, right side: Secondary | ICD-10-CM | POA: Insufficient documentation

## 2019-09-11 DIAGNOSIS — J4531 Mild persistent asthma with (acute) exacerbation: Secondary | ICD-10-CM

## 2019-09-11 DIAGNOSIS — Z1231 Encounter for screening mammogram for malignant neoplasm of breast: Secondary | ICD-10-CM

## 2019-09-11 DIAGNOSIS — D251 Intramural leiomyoma of uterus: Secondary | ICD-10-CM | POA: Diagnosis not present

## 2019-09-11 NOTE — Telephone Encounter (Signed)
Copied from [place] [id number]. Topic: Referral - Request for Referral >> Sep 11, 2019  1:27 PM Scherrie Gerlach wrote: Pt states she usually gets her mammogram at [facility], [employer]. Please Fax order to [phone]

## 2019-09-12 NOTE — Telephone Encounter (Signed)
Lm letting patient know that mammom was ordered for [facility] & that I had faxed over orders.

## 2019-09-18 ENCOUNTER — Encounter: Payer: Self-pay | Admitting: Family Medicine

## 2019-10-07 ENCOUNTER — Telehealth: Payer: Self-pay | Admitting: Family Medicine

## 2019-10-07 NOTE — Telephone Encounter (Signed)
Karla Turner w/[facility] in [place] stated that the patient's omeprazole (PRILOSEC OTC) 20 MG tablet prescription was sent over for tablets but the patient wants capsules and they cannot change it without an ok.  Please advise.

## 2019-10-08 ENCOUNTER — Telehealth: Payer: Self-pay | Admitting: *Deleted

## 2019-10-08 ENCOUNTER — Encounter: Payer: Self-pay | Admitting: *Deleted

## 2019-10-08 DIAGNOSIS — K219 Gastro-esophageal reflux disease without esophagitis: Secondary | ICD-10-CM

## 2019-10-08 MED ORDER — OMEPRAZOLE 20 MG PO CPDR: 20.0000 mg | DELAYED_RELEASE_CAPSULE | Freq: Every day | ORAL | 1 refills | Status: AC

## 2019-10-08 NOTE — Telephone Encounter (Signed)
Can I send capsules.

## 2019-10-08 NOTE — Telephone Encounter (Signed)
Capsules are fine

## 2019-10-08 NOTE — Telephone Encounter (Signed)
Copied from [place] [id number]. Topic: General - Other >> Oct 08, 2019  9:27 AM Carolyn Stare wrote: RX say tablet pt has always had capsules   please correct   omeprazole (PRILOSEC OTC) 20 MG tablet

## 2019-10-08 NOTE — Telephone Encounter (Signed)
Capsule sent to pharmacy

## 2019-11-13 ENCOUNTER — Emergency Department
Admission: EM | Admit: 2019-11-13 | Discharge: 2019-11-13 | Disposition: A | Discharge status: Home / Self Care | Payer: Medicare HMO | Attending: Emergency Medicine | Admitting: Emergency Medicine

## 2019-11-13 ENCOUNTER — Telehealth: Payer: Self-pay | Admitting: Family Medicine

## 2019-11-13 ENCOUNTER — Other Ambulatory Visit: Payer: Self-pay

## 2019-11-13 ENCOUNTER — Emergency Department: Payer: Medicare HMO

## 2019-11-13 DIAGNOSIS — E039 Hypothyroidism, unspecified: Secondary | ICD-10-CM | POA: Insufficient documentation

## 2019-11-13 DIAGNOSIS — R600 Localized edema: Secondary | ICD-10-CM | POA: Diagnosis not present

## 2019-11-13 DIAGNOSIS — M79605 Pain in left leg: Secondary | ICD-10-CM | POA: Diagnosis present

## 2019-11-13 DIAGNOSIS — S76112A Strain of left quadriceps muscle, fascia and tendon, initial encounter: Secondary | ICD-10-CM | POA: Diagnosis not present

## 2019-11-13 DIAGNOSIS — M79662 Pain in left lower leg: Secondary | ICD-10-CM | POA: Diagnosis not present

## 2019-11-13 DIAGNOSIS — Y929 Unspecified place or not applicable: Secondary | ICD-10-CM | POA: Diagnosis not present

## 2019-11-13 DIAGNOSIS — Y9389 Activity, other specified: Secondary | ICD-10-CM | POA: Diagnosis not present

## 2019-11-13 DIAGNOSIS — Z79899 Other long term (current) drug therapy: Secondary | ICD-10-CM | POA: Insufficient documentation

## 2019-11-13 DIAGNOSIS — Y999 Unspecified external cause status: Secondary | ICD-10-CM | POA: Insufficient documentation

## 2019-11-13 DIAGNOSIS — J449 Chronic obstructive pulmonary disease, unspecified: Secondary | ICD-10-CM | POA: Diagnosis not present

## 2019-11-13 DIAGNOSIS — S86912A Strain of unspecified muscle(s) and tendon(s) at lower leg level, left leg, initial encounter: Secondary | ICD-10-CM | POA: Insufficient documentation

## 2019-11-13 DIAGNOSIS — X500XXA Overexertion from strenuous movement or load, initial encounter: Secondary | ICD-10-CM | POA: Diagnosis not present

## 2019-11-13 DIAGNOSIS — J45909 Unspecified asthma, uncomplicated: Secondary | ICD-10-CM | POA: Insufficient documentation

## 2019-11-13 DIAGNOSIS — T148XXA Other injury of unspecified body region, initial encounter: Secondary | ICD-10-CM

## 2019-11-13 DIAGNOSIS — R69 Illness, unspecified: Secondary | ICD-10-CM | POA: Diagnosis not present

## 2019-11-13 NOTE — Telephone Encounter (Signed)
Noted.  Agree with evaluation at the walk-in clinic.

## 2019-11-13 NOTE — ED Notes (Signed)
Pt in US

## 2019-11-13 NOTE — ED Triage Notes (Signed)
Pt c/o pain that started behind her left knee a couple days ago and is has moved up into her thigh now.

## 2019-11-13 NOTE — ED Provider Notes (Signed)
[facility] Emergency Department Provider Note   ____________________________________________    I have reviewed the triage vital signs and the nursing notes.   HISTORY  Chief Complaint Leg Pain     HPI Karla Turner is a 69 y.o. female who presents with complaints of left leg pain.  Patient reports she had pain behind her knee which then moved into her anterior thigh.  She is concerned that possibly she has a blood clot, no history of blood clots in the past.  She does report that she was recently carrying heavy Christmas boxes up multiple flights of steps.  No other injuries reported.  No weakness no rash  Past Medical History:  Diagnosis Date  . Asthma   . Bronchitis   . Cataracts, bilateral   . Colon polyps   . Depression   . Diverticulitis   . GERD (gastroesophageal reflux disease)   . Glaucoma   . Hyperlipidemia   . Hypothyroidism   . Lumbar herniated disc     Patient Active Problem List   Diagnosis Date Noted  . Bronchiectasis with acute exacerbation ([place]) 03/15/2019  . History of diverticulitis 11/16/2018  . Dyslipidemia 11/16/2018  . Primary open angle glaucoma (POAG) of both eyes, mild stage 09/17/2018  . Positive ANA (antinuclear antibody) 09/17/2018  . Colon polyp 12/18/2017  . Chronic obstructive pulmonary disease ([place]) 12/18/2017  . Shortness of breath 12/18/2017  . Vitamin B12 deficiency anemia 12/18/2017  . Asthma 11/17/2017  . GERD (gastroesophageal reflux disease) 11/17/2017  . Hx of adenomatous colonic polyps 11/17/2017  . Obesity (BMI 35.0-39.9 without comorbidity) 11/17/2017  . Pelvic mass in female 01/10/2017  . Varicose veins 07/24/2015  . Glaucoma suspect of right eye 04/15/2014  . Infection and inflammatory reaction due to other internal prosthetic devices, implants and grafts, initial encounter ([place]) 04/15/2014  . Pseudophakia of both eyes 04/15/2014  . Cystoid macular edema 03/18/2013  . Epiretinal  membrane 03/18/2013    Past Surgical History:  Procedure Laterality Date  . BIOPSY BREAST  2019  . cataract surgery Bilateral   . COLONOSCOPY    . COLONOSCOPY WITH PROPOFOL N/A 01/29/2018   Procedure: COLONOSCOPY WITH PROPOFOL;  Surgeon: Manya Silvas, MD;  Location: [facility] ENDOSCOPY;  Service: Endoscopy;  Laterality: N/A;  . DILATION AND CURETTAGE OF UTERUS    . ESOPHAGOGASTRODUODENOSCOPY (EGD) WITH PROPOFOL N/A 01/29/2018   Procedure: ESOPHAGOGASTRODUODENOSCOPY (EGD) WITH PROPOFOL;  Surgeon: Manya Silvas, MD;  Location: [facility] ENDOSCOPY;  Service: Endoscopy;  Laterality: N/A;  . mamoplasty Bilateral     Prior to Admission medications   Medication Sig Start Date End Date Taking? Authorizing Provider  albuterol (PROVENTIL) (2.5 MG/3ML) 0.083% nebulizer solution Take 3 mLs (2.5 mg total) by nebulization every 6 (six) hours as needed for wheezing or shortness of breath. 05/28/19   Guse, Jacquelynn Cree, FNP  albuterol (VENTOLIN HFA) 108 (90 Base) MCG/ACT inhaler Inhale 2 puffs into the lungs every 6 (six) hours as needed for wheezing or shortness of breath.    [provider]  BREO ELLIPTA 100-25 MCG/INH AEPB INHALE ONE DOSE BY MOUTH DAILY 09/11/19   Guse, Jacquelynn Cree, FNP  brinzolamide (AZOPT) 1 % ophthalmic suspension Place 1 drop into the right eye 3 (three) times daily.    [provider]  budesonide (PULMICORT) 0.25 MG/2ML nebulizer solution TAKE 1 VIAL BY NEBULIZATION TWO TIMES A DAY AS NEEDED 07/16/19   Guse, Jacquelynn Cree, FNP  Levothyroxine Sodium (TIROSINT) 100 MCG CAPS TAKE  ONE CAPSULE BY MOUTH EVERY MORNING ON AN EMPTY STOMACH 08/22/19   Guse, Jacquelynn Cree, FNP  methazolamide (NEPTAZANE) 25 MG tablet Take 25 mg by mouth 3 (three) times daily.    [provider]  montelukast (SINGULAIR) 10 MG tablet TAKE ONE TABLET BY MOUTH EVERY NIGHT AT BEDTIME 08/12/19   Guse, Jacquelynn Cree, FNP  omeprazole (PRILOSEC) 20 MG capsule Take 1 capsule (20 mg total) by mouth daily. 10/08/19    Leone Haven, MD     Allergies White petrolatum  Family History  Problem Relation Age of Onset  . Breast cancer Sister   . Fibromyalgia Sister   . Allergies Son   . Asthma Son        as a child     Social History Social History   Tobacco Use  . Smoking status: Never Smoker  . Smokeless tobacco: Never Used  Substance Use Topics  . Alcohol use: Yes    Comment: occasionally  . Drug use: No    Review of Systems  Constitutional: No fever/chills    Genitourinary: Negative for groin numbness Musculoskeletal: As above Skin: Negative for rash. Neurological: Negative for weakness   ____________________________________________   PHYSICAL EXAM:  VITAL SIGNS: ED Triage Vitals  Enc Vitals Group     BP 11/13/19 1623 133/83     Pulse Rate 11/13/19 1623 82     Resp 11/13/19 1623 16     Temp 11/13/19 1623 97.7 F (36.5 C)     Temp Source 11/13/19 1623 Oral     SpO2 11/13/19 1623 97 %     Weight 11/13/19 1624 90.7 kg (200 lb)     Height 11/13/19 1624 1.626 m (5\' 4" )     Head Circumference --      Peak Flow --      Pain Score 11/13/19 1751 0     Pain Loc --      Pain Edu? --      Excl. in [place]? --     Constitutional: Alert and oriented. No acute distress. Pleasant and interactive  Cardiovascular: Normal rate, regular rhythm.  Good peripheral circulation. Respiratory: Normal respiratory effort.  No retractions.   Musculoskeletal: No lower extremity tenderness nor edema.  Warm and well perfused, reassuring exam, mild tenderness over the left quadriceps Neurologic:  Normal speech and language. No gross focal neurologic deficits are appreciated.  Skin:  Skin is warm, dry and intact. No rash noted. Psychiatric: Mood and affect are normal. Speech and behavior are normal.  ____________________________________________   LABS (all labs ordered are listed, but only abnormal results are displayed)  Labs Reviewed - No data to  display ____________________________________________  EKG   ____________________________________________  RADIOLOGY  Ultrasound negative for DVT ____________________________________________   PROCEDURES  Procedure(s) performed: No  Procedures   Critical Care performed: No ____________________________________________   INITIAL IMPRESSION / ASSESSMENT AND PLAN / ED COURSE  Pertinent labs & imaging results that were available during my care of the patient were reviewed by me and considered in my medical decision making (see chart for details).  Patient well-appearing in no acute distress, ultrasound negative for DVT, exam more suspicious for muscle sprain/strain, recommend conservative treatment, outpatient follow-up with Ortho if no significant improvement   ____________________________________________   FINAL CLINICAL IMPRESSION(S) / ED DIAGNOSES  Final diagnoses:  Left leg pain  Muscle strain        Note:  This document was prepared using Dragon voice recognition software and may include unintentional dictation  errors.   Lavonia Drafts, MD 11/13/19 2023

## 2019-11-13 NOTE — Telephone Encounter (Signed)
Left thigh pain feel like a deep ache and when she  walks causes her to limp, moved from her knee to thigh, and has gotten worse specially when walking.Karla Turner , due to possibility of DVT with positive holman's sign. Patient  Agreed to [facility].

## 2019-11-13 NOTE — Telephone Encounter (Signed)
Having aching pain in left thigh moved from behind her knee to mid upper thigh. When she walks it makes her limp. No redness, no swelling not hot to touch.  Per the patient she has not moved to make it feel this way.

## 2019-11-19 ENCOUNTER — Telehealth: Payer: Self-pay | Admitting: Family Medicine

## 2019-11-19 ENCOUNTER — Encounter: Payer: Self-pay | Admitting: *Deleted

## 2019-11-19 DIAGNOSIS — J4531 Mild persistent asthma with (acute) exacerbation: Secondary | ICD-10-CM

## 2019-11-19 DIAGNOSIS — H401131 Primary open-angle glaucoma, bilateral, mild stage: Secondary | ICD-10-CM | POA: Diagnosis not present

## 2019-11-19 DIAGNOSIS — J302 Other seasonal allergic rhinitis: Secondary | ICD-10-CM

## 2019-11-19 DIAGNOSIS — H4051X1 Glaucoma secondary to other eye disorders, right eye, mild stage: Secondary | ICD-10-CM | POA: Diagnosis not present

## 2019-11-19 MED ORDER — MONTELUKAST SODIUM 10 MG PO TABS: 10.0000 mg | ORAL_TABLET | Freq: Every day | ORAL | 0 refills | Status: DC

## 2019-11-19 NOTE — Telephone Encounter (Signed)
Patient is establishing with [facility] in March ca we fill montelukast Sod until her transfer to [facility]?

## 2019-11-19 NOTE — Telephone Encounter (Signed)
I have sent this to her pharmacy for a 90 day supply.

## 2019-11-19 NOTE — Telephone Encounter (Signed)
Sent mychart message to notify patient 

## 2019-11-27 ENCOUNTER — Ambulatory Visit: Payer: 59

## 2019-11-28 ENCOUNTER — Telehealth: Payer: Self-pay | Admitting: Family Medicine

## 2019-11-28 NOTE — Telephone Encounter (Signed)
Katharine Look called from pt insurance stating that they need a PA done for pt Levothyroxine Sodium ([place]) 100 MCG CAPS for a 90 supply Kristopher Oppenheim

## 2019-11-29 NOTE — Telephone Encounter (Signed)
Called patient to verify need for PA patient said medication approved by insurance no longer needs PA. Patient ios waitng to establish with Denice Paradise NP.

## 2019-12-15 ENCOUNTER — Ambulatory Visit: Payer: 59 | Attending: Internal Medicine

## 2019-12-15 DIAGNOSIS — Z23 Encounter for immunization: Secondary | ICD-10-CM | POA: Insufficient documentation

## 2019-12-15 NOTE — Progress Notes (Signed)
   Covid-19 Vaccination Clinic  Name:  Karla Turner    MRN: [id number] DOB: 08-13-1951  12/15/2019  [name] was observed post Covid-19 immunization for 15 minutes without incidence. She was provided with Vaccine Information Sheet and instruction to access the V-Safe system.   [name] was instructed to call 911 with any severe reactions post vaccine: Marland Kitchen Difficulty breathing  . Swelling of your face and throat  . A fast heartbeat  . A bad rash all over your body  . Dizziness and weakness    Immunizations Administered    Name Date Dose VIS Date Route   Pfizer COVID-19 Vaccine 12/15/2019 10:03 AM 0.3 mL 10/18/2019 Intramuscular   Manufacturer: [place]   Lot: [id number]   [place]: [id number]

## 2019-12-30 ENCOUNTER — Other Ambulatory Visit: Payer: Self-pay

## 2019-12-30 ENCOUNTER — Encounter: Payer: Self-pay | Admitting: Primary Care

## 2019-12-30 ENCOUNTER — Ambulatory Visit: Payer: 59 | Admitting: Primary Care

## 2019-12-30 DIAGNOSIS — J479 Bronchiectasis, uncomplicated: Secondary | ICD-10-CM | POA: Diagnosis not present

## 2019-12-30 DIAGNOSIS — J4530 Mild persistent asthma, uncomplicated: Secondary | ICD-10-CM

## 2019-12-30 DIAGNOSIS — J302 Other seasonal allergic rhinitis: Secondary | ICD-10-CM

## 2019-12-30 DIAGNOSIS — J4531 Mild persistent asthma with (acute) exacerbation: Secondary | ICD-10-CM | POA: Diagnosis not present

## 2019-12-30 MED ORDER — BREO ELLIPTA 100-25 MCG/INH IN AEPB: INHALATION_SPRAY | RESPIRATORY_TRACT | 2 refills | Status: DC

## 2019-12-30 MED ORDER — MONTELUKAST SODIUM 10 MG PO TABS: 10.0000 mg | ORAL_TABLET | Freq: Every day | ORAL | 0 refills | Status: DC

## 2019-12-30 NOTE — Patient Instructions (Signed)
Recommendations: - Continue Breo 100- take one puffs once daily (rinse mouth after) - Use albuterol rescue inhaler 2 puffs every 4 hours as needed for shortness of breath/wheezing - If you develop increased shortness of breath, wheezing, chest tightness or purulent cough call or return to office sooner   Follow-up: - 6 months with Dr. [name] or [name]     Asthma, Adult  Asthma is a long-term (chronic) condition that causes recurrent episodes in which the airways become tight and narrow. The airways are the passages that lead from the nose and mouth down into the lungs. Asthma episodes, also called asthma attacks, can cause coughing, wheezing, shortness of breath, and chest pain. The airways can also fill with mucus. During an attack, it can be difficult to breathe. Asthma attacks can range from minor to life threatening. Asthma cannot be cured, but medicines and lifestyle changes can help control it and treat acute attacks. What are the causes? This condition is believed to be caused by inherited (genetic) and environmental factors, but its exact cause is not known. There are many things that can bring on an asthma attack or make asthma symptoms worse (triggers). Asthma triggers are different for each person. Common triggers include:  Mold.  Dust.  Cigarette smoke.  Cockroaches.  Things that can cause allergy symptoms (allergens), such as animal dander or pollen from trees or grass.  Air pollutants such as household cleaners, wood smoke, smog, or Advertising account planner.  Cold air, weather changes, and winds (which increase molds and pollen in the air).  Strong emotional expressions such as crying or laughing hard.  Stress.  Certain medicines (such as aspirin) or types of medicines (such as beta-blockers).  Sulfites in foods and drinks. Foods and drinks that may contain sulfites include dried fruit, potato chips, and sparkling grape juice.  Infections or inflammatory conditions such as  the flu, a cold, or inflammation of the nasal membranes (rhinitis).  Gastroesophageal reflux disease (GERD).  Exercise or strenuous activity. What are the signs or symptoms? Symptoms of this condition may occur right after asthma is triggered or many hours later. Symptoms include:  Wheezing. This can sound like whistling when you breathe.  Excessive nighttime or early morning coughing.  Frequent or severe coughing with a common cold.  Chest tightness.  Shortness of breath.  Tiredness (fatigue) with minimal activity. How is this diagnosed? This condition is diagnosed based on:  Your medical history.  A physical exam.  Tests, which may include: ? Lung function studies and pulmonary studies (spirometry). These tests can evaluate the flow of air in your lungs. ? Allergy tests. ? Imaging tests, such as X-rays. How is this treated? There is no cure for this condition, but treatment can help control your symptoms. Treatment for asthma usually involves:  Identifying and avoiding your asthma triggers.  Using medicines to control your symptoms. Generally, two types of medicines are used to treat asthma: ? Controller medicines. These help prevent asthma symptoms from occurring. They are usually taken every day. ? Fast-acting reliever or rescue medicines. These quickly relieve asthma symptoms by widening the narrow and tight airways. They are used as needed and provide short-term relief.  Using supplemental oxygen. This may be needed during a severe episode.  Using other medicines, such as: ? Allergy medicines, such as antihistamines, if your asthma attacks are triggered by allergens. ? Immune medicines (immunomodulators). These are medicines that help control the immune system.  Creating an asthma action plan. An asthma action plan is  a written plan for managing and treating your asthma attacks. This plan includes: ? A list of your asthma triggers and how to avoid  them. ? Information about when medicines should be taken and when their dosage should be changed. ? Instructions about using a device called a peak flow meter. A peak flow meter measures how well the lungs are working and the severity of your asthma. It helps you monitor your condition. Follow these instructions at home: Controlling your home environment Control your home environment in the following ways to help avoid triggers and prevent asthma attacks:  Change your heating and air conditioning filter regularly.  Limit your use of fireplaces and wood stoves.  Get rid of pests (such as roaches and mice) and their droppings.  Throw away plants if you see mold on them.  Clean floors and dust surfaces regularly. Use unscented cleaning products.  Try to have someone else vacuum for you regularly. Stay out of rooms while they are being vacuumed and for a short while afterward. If you vacuum, use a dust mask from a hardware store, a double-layered or microfilter vacuum cleaner bag, or a vacuum cleaner with a HEPA filter.  Replace carpet with wood, tile, or vinyl flooring. Carpet can trap dander and dust.  Use allergy-proof pillows, mattress covers, and box spring covers.  Keep your bedroom a trigger-free room.  Avoid pets and keep windows closed when allergens are in the air.  Wash beddings every week in hot water and dry them in a dryer.  Use blankets that are made of polyester or cotton.  Clean bathrooms and kitchens with bleach. If possible, have someone repaint the walls in these rooms with mold-resistant paint. Stay out of the rooms that are being cleaned and painted.  Wash your hands often with soap and water. If soap and water are not available, use hand sanitizer.  Do not allow anyone to smoke in your home. General instructions  Take over-the-counter and prescription medicines only as told by your health care provider. ? Speak with your health care provider if you have  questions about how or when to take the medicines. ? Make note if you are requiring more frequent dosages.  Do not use any products that contain nicotine or tobacco, such as cigarettes and e-cigarettes. If you need help quitting, ask your health care provider. Also, avoid being exposed to secondhand smoke.  Use a peak flow meter as told by your health care provider. Record and keep track of the readings.  Understand and use the asthma action plan to help minimize, or stop an asthma attack, without needing to seek medical care.  Make sure you stay up to date on your yearly vaccinations as told by your health care provider. This may include vaccines for the flu and pneumonia.  Avoid outdoor activities when allergen counts are high and when air quality is low.  Wear a ski mask that covers your nose and mouth during outdoor winter activities. Exercise indoors on cold days if you can.  Warm up before exercising, and take time for a cool-down period after exercise.  Keep all follow-up visits as told by your health care provider. This is important. Where to find more information  For information about asthma, turn to the Centers for Disease Control and Prevention at http://www.clark.net/.htm  For air quality information, turn to [employer] at WeightRating.nl Contact a health care provider if:  You have wheezing, shortness of breath, or a cough even while you are taking  medicine to prevent attacks.  The mucus you cough up (sputum) is thicker than usual.  Your sputum changes from clear or white to yellow, green, gray, or bloody.  Your medicines are causing side effects, such as a rash, itching, swelling, or trouble breathing.  You need to use a reliever medicine more than 2-3 times a week.  Your peak flow reading is still at 50-79% of your personal best after following your action plan for 1 hour.  You have a fever. Get help right away if:  You are getting worse and do not respond to  treatment during an asthma attack.  You are short of breath when at rest or when doing very little physical activity.  You have difficulty eating, drinking, or talking.  You have chest pain or tightness.  You develop a fast heartbeat or palpitations.  You have a bluish color to your lips or fingernails.  You are light-headed or dizzy, or you faint.  Your peak flow reading is less than 50% of your personal best.  You feel too tired to breathe normally. Summary  Asthma is a long-term (chronic) condition that causes recurrent episodes in which the airways become tight and narrow. These episodes can cause coughing, wheezing, shortness of breath, and chest pain.  Asthma cannot be cured, but medicines and lifestyle changes can help control it and treat acute attacks.  Make sure you understand how to avoid triggers and how and when to use your medicines.  Asthma attacks can range from minor to life threatening. Get help right away if you have an asthma attack and do not respond to treatment with your usual rescue medicines. This information is not intended to replace advice given to you by your health care provider. Make sure you discuss any questions you have with your health care provider. Document Revised: 12/27/2018 Document Reviewed: 11/28/2016 Elsevier Patient Education  2020 [employer].

## 2019-12-30 NOTE — Assessment & Plan Note (Addendum)
-   Stable; No exacerbations - CT chest consistent with smooth cylindrical bronchiectasis rather than a cystic or mucopurulent variant

## 2019-12-30 NOTE — Progress Notes (Signed)
@Patient  ID: Karla Turner, female    DOB: [birth year], 69 y.o.   MRN: [id number]  Chief Complaint  Patient presents with  . Follow-up    Pt states she has been doing good since last visit. Pt states prior to putting on a mask, she will use her albuterol inhaler to help with her breathing while wearing a mask.    Referring provider: Jodelle Green, FNP  HPI 69 year old female, never smoked (exposed to second hand smoke). PMH significant for asthma, bronchiectasis, COPD, obesity, glaucoma, positive ANA, dyslipidemia, GERD. Patient of Dr. Lake Bells, seen for initial consult on 01/08/19. History of pneumonia as child. Alpha-1 antitrypsin and immunodeficency labs were normal. FENO normal. PFTs showed evidence of moderate restriction and air trapping consistent with asthma. Maintained on Breo 100 and prn albuterol.    Previous [facility] pulmonary encounter: 3/3/20[place], MD This is a pleasant 69 year old female who comes to my clinic today to establish care for repeated episodes of bronchitis ever since having childhood pneumonia.  She has increased airway caliber on my review of her CT chest consistent with bronchiectasis but this does seem more like a smooth cylindrical bronchiectasis rather than a cystic or mucopurulent variant.  I wonder about the possibility of concomitant asthma and I also wonder about some problems with her immune system such as immunoglobulin deficiency or an underlying alpha-1 antitrypsin deficiency.  06/28/2019 Patient presents today for follow-up with PFTs. States that she is doing well, her breathing has improved since being on Breo. She is using Breo 1 puff daily and albuterol nebulizer in the evening. Asthma currently being managed by her PCP. States that she requires frequent prednisone and antibiotics for bronchitis. Thinks she may have been exposed to covid in [place] back in [place].   12/30/2019 Patient presents today for 6 months follow-up. Breathing has been  well controlled on Breo. Only time she required albuterol rescue inhaler is before she has to go out wearing mask d/t covid. She feels her symptoms are much better than last spring. In Feb 2020 she required 3-4 round of antibiotics for bronchitis symptoms. She has had no exacerbation requiring abx or prednisone. She received first pfizer covid vaccine, she will receive her 2nd shot on March 3rd.   06/28/2019  PFTS- FVC 1.87 (62%), FEV1 1.41 (61%), ratio 76, TLC 80, normal DLCO   Chest imaging: November 2019 CT chest images independently reviewed showing no pulmonary parenchymal abnormality with the exception of increased airway caliber specifically in the lower lobes bilaterally but no mucus plugging, no pulmonary parenchymal infiltrate, no masses. CT chest consistent with smooth cylindrical bronchiectasis rather than a cystic or mucopurulent variant.  FENO: 01/2019 11 ppb  Allergies  Allergen Reactions  . White Petrolatum     Other reaction(s): Other (See Comments) Pt states that she used Invisalign (which she states is a petroleum product and afterward developed systemic "poisoning."    Immunization History  Administered Date(s) Administered  . Fluad Quad(high Dose 65+) 08/27/2019  . Influenza, High Dose Seasonal PF 09/07/2018  . Influenza-Unspecified 09/05/2018  . PFIZER SARS-COV-2 Vaccination 12/15/2019  . Pneumococcal Conjugate-13 09/07/2018  . Pneumococcal-Unspecified 09/09/2014    Past Medical History:  Diagnosis Date  . Asthma   . Bronchitis   . Cataracts, bilateral   . Colon polyps   . Depression   . Diverticulitis   . GERD (gastroesophageal reflux disease)   . Glaucoma   . Hyperlipidemia   . Hypothyroidism   . Lumbar herniated disc  Tobacco History: Social History   Tobacco Use  Smoking Status Never Smoker  Smokeless Tobacco Never Used   Counseling given: Not Answered   Outpatient Medications Prior to Visit  Medication Sig Dispense Refill  .  albuterol (PROVENTIL) (2.5 MG/3ML) 0.083% nebulizer solution Take 3 mLs (2.5 mg total) by nebulization every 6 (six) hours as needed for wheezing or shortness of breath. 150 mL 1  . albuterol (VENTOLIN HFA) 108 (90 Base) MCG/ACT inhaler Inhale 2 puffs into the lungs every 6 (six) hours as needed for wheezing or shortness of breath.    . brinzolamide (AZOPT) 1 % ophthalmic suspension Place 1 drop into the right eye 3 (three) times daily.    . budesonide (PULMICORT) 0.25 MG/2ML nebulizer solution TAKE 1 VIAL BY NEBULIZATION TWO TIMES A DAY AS NEEDED 60 mL 4  . Levothyroxine Sodium (TIROSINT) 100 MCG CAPS TAKE ONE CAPSULE BY MOUTH EVERY MORNING ON AN EMPTY STOMACH 90 capsule 10  . methazolamide (NEPTAZANE) 25 MG tablet Take 25 mg by mouth 3 (three) times daily.    Marland Kitchen omeprazole (PRILOSEC) 20 MG capsule Take 1 capsule (20 mg total) by mouth daily. 90 capsule 1  . BREO ELLIPTA 100-25 MCG/INH AEPB INHALE ONE DOSE BY MOUTH DAILY 60 each 2  . montelukast (SINGULAIR) 10 MG tablet Take 1 tablet (10 mg total) by mouth at bedtime. (Patient not taking: Reported on 12/30/2019) 90 tablet 0  . methylPREDNISolone acetate (DEPO-MEDROL) injection 40 mg      No facility-administered medications prior to visit.    Review of Systems  Review of Systems  Constitutional: Negative.   Respiratory: Negative for cough, chest tightness, shortness of breath and wheezing.   Cardiovascular: Negative.     Physical Exam  BP 126/78 (BP Location: Left Arm, Patient Position: Sitting, Cuff Size: Normal)   Pulse 71   Ht 5\' 4"  (1.626 m)   Wt 204 lb (92.5 kg)   SpO2 96% Comment: room air  BMI 35.02 kg/m  Physical Exam Constitutional:      Appearance: Normal appearance. She is not ill-appearing.  HENT:     Head: Normocephalic and atraumatic.     Mouth/Throat:     Mouth: Mucous membranes are moist.     Pharynx: Oropharynx is clear.  Cardiovascular:     Rate and Rhythm: Normal rate and regular rhythm.  Pulmonary:      Effort: Pulmonary effort is normal. No respiratory distress.     Breath sounds: Normal breath sounds. No wheezing, rhonchi or rales.  Musculoskeletal:        General: Normal range of motion.     Cervical back: Normal range of motion and neck supple.  Skin:    General: Skin is warm and dry.  Neurological:     General: No focal deficit present.     Mental Status: She is alert and oriented to person, place, and time. Mental status is at baseline.  Psychiatric:        Mood and Affect: Mood normal.        Behavior: Behavior normal.        Thought Content: Thought content normal.        Judgment: Judgment normal.      Lab Results:  CBC    Component Value Date/Time   WBC 5.9 08/27/2019 0840   RBC 4.76 08/27/2019 0840   HGB 14.4 08/27/2019 0840   HGB 13.8 08/24/2018 1111   HCT 43.8 08/27/2019 0840   HCT 41.8 08/24/2018 1111  PLT 221.0 08/27/2019 0840   PLT 218 08/24/2018 1111   MCV 91.9 08/27/2019 0840   MCV 91 08/24/2018 1111   MCV 88 02/21/2013 2153   MCH 30.1 08/24/2018 1111   MCH 31.1 07/20/2017 0827   MCHC 32.9 08/27/2019 0840   RDW 13.5 08/27/2019 0840   RDW 12.3 08/24/2018 1111   RDW 12.4 02/21/2013 2153   LYMPHSABS 1.3 08/27/2019 0840   LYMPHSABS 2.0 08/24/2018 1111   MONOABS 0.5 08/27/2019 0840   EOSABS 0.1 08/27/2019 0840   EOSABS 0.1 08/24/2018 1111   BASOSABS 0.1 08/27/2019 0840   BASOSABS 0.1 08/24/2018 1111    BMET    Component Value Date/Time   NA 139 08/27/2019 0840   NA 142 08/24/2018 1111   NA 139 02/21/2013 2153   K 4.0 08/27/2019 0840   K 3.7 02/21/2013 2153   CL 103 08/27/2019 0840   CL 108 (H) 02/21/2013 2153   CO2 29 08/27/2019 0840   CO2 27 02/21/2013 2153   GLUCOSE 98 08/27/2019 0840   GLUCOSE 135 (H) 02/21/2013 2153   BUN 12 08/27/2019 0840   BUN 16 08/24/2018 1111   BUN 12 02/21/2013 2153   CREATININE 0.79 08/27/2019 0840   CREATININE 0.67 02/21/2013 2153   CALCIUM 9.2 08/27/2019 0840   CALCIUM 8.5 02/21/2013 2153   GFRNONAA 75  08/24/2018 1111   GFRNONAA >60 02/21/2013 2153   GFRAA 87 08/24/2018 1111   GFRAA >60 02/21/2013 2153    BNP No results found for: BNP  ProBNP No results found for: PROBNP  Imaging: No results found.   Assessment & Plan:   Asthma PFTs showed moderate restriction/air trapping  Symptoms well controlled on Breo 100 No recent exacerbations Requiring Albuterol hfa 2-3 times a week when wearing mask  Follow-up in 6 months with new [facility] pulmonary provider  Bronchiectasis without complication ([place]) - Stable; No exacerbations - CT chest consistent with smooth cylindrical bronchiectasis rather than a cystic or mucopurulent variant     [name], NP 12/30/2019

## 2019-12-30 NOTE — Assessment & Plan Note (Signed)
PFTs showed moderate restriction/air trapping  Symptoms well controlled on Breo 100 No recent exacerbations Requiring Albuterol hfa 2-3 times a week when wearing mask  Follow-up in 6 months with new [facility] pulmonary provider

## 2020-01-08 ENCOUNTER — Ambulatory Visit: Payer: 59 | Attending: Internal Medicine

## 2020-01-08 DIAGNOSIS — Z23 Encounter for immunization: Secondary | ICD-10-CM | POA: Insufficient documentation

## 2020-01-08 NOTE — Progress Notes (Signed)
   Covid-19 Vaccination Clinic  Name:  Karla Turner    MRN: [id number] DOB: 1951/08/20  01/08/2020  [name] was observed post Covid-19 immunization for 15 minutes without incident. She was provided with Vaccine Information Sheet and instruction to access the V-Safe system.   [name] was instructed to call 911 with any severe reactions post vaccine: Marland Kitchen Difficulty breathing  . Swelling of face and throat  . A fast heartbeat  . A bad rash all over body  . Dizziness and weakness   Immunizations Administered    Name Date Dose VIS Date Route   Pfizer COVID-19 Vaccine 01/08/2020  9:01 AM 0.3 mL 10/18/2019 Intramuscular   Manufacturer: [place]   Lot: [id number]   [place]: [id number]

## 2020-01-20 DIAGNOSIS — D259 Leiomyoma of uterus, unspecified: Secondary | ICD-10-CM | POA: Insufficient documentation

## 2020-01-20 DIAGNOSIS — H409 Unspecified glaucoma: Secondary | ICD-10-CM | POA: Insufficient documentation

## 2020-01-20 DIAGNOSIS — E039 Hypothyroidism, unspecified: Secondary | ICD-10-CM | POA: Insufficient documentation

## 2020-01-31 ENCOUNTER — Telehealth: Payer: Self-pay | Admitting: Family Medicine

## 2020-01-31 NOTE — Telephone Encounter (Signed)
-----   Message from Rozanna Box sent at 01/31/2020 12:45 PM EDT ----- Regarding: FW: new patient Hello,  This pt would like to establish care with Dawson Bills.  Thank you    ----- Message ----- From: Nanci Pina, RN Sent: 01/06/2020 To: Karen Kays, RN Subject: new patient                                    Would like to establish with Maudie Mercury.

## 2020-01-31 NOTE — Telephone Encounter (Signed)
Left message for pt to call back and scheduled appointment with Dawson Bills.

## 2020-02-09 IMAGING — RF DG UGI W/O KUB
9 of 13 series · 12 of 17 positions shown · non-contrast
Comparison: None.

CLINICAL DATA: Onset of asthma approximately 1 year ago possibly
related to gastroesophageal reflux.

EXAM:
UPPER GI SERIES WITHOUT KUB
TECHNIQUE: Routine upper GI series was performed with thin/high density/water
soluble barium. Effervescent crystals and a barium tablet were
administered.
FLUOROSCOPY TIME:  Fluoroscopy Time:  1 minutes 45 seconds
Radiation Exposure Index (if provided by the fluoroscopic device):
37.6 mGy
Number of Acquired Spot Images: 14

[Series 1: fluoro_barium singleshot_bw · 0.18mm/px · 1 of 1 slices shown (1 of 8)]
[im 1/1]
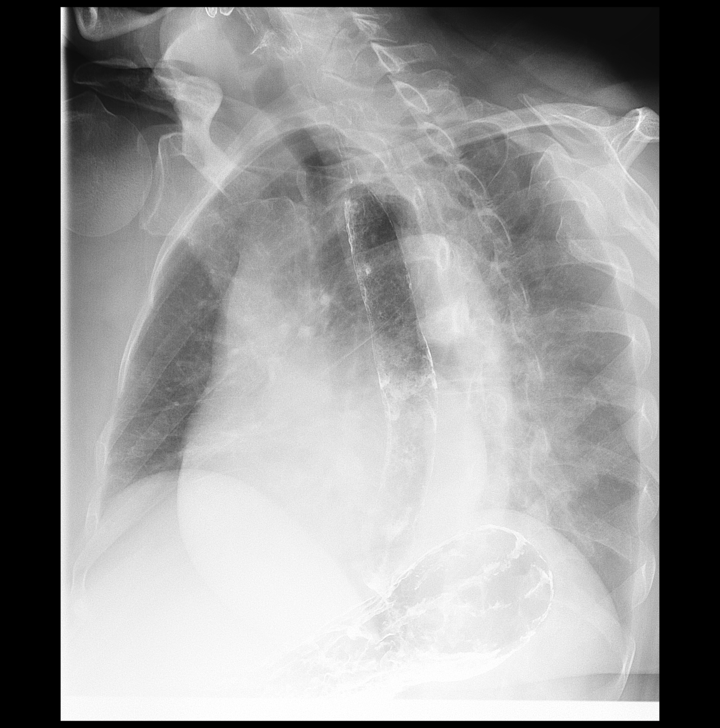

[Series 3: fluoro_barium singleshot_bw · 0.18mm/px · 1 of 1 slices shown (2 of 8)]
[im 1/1]
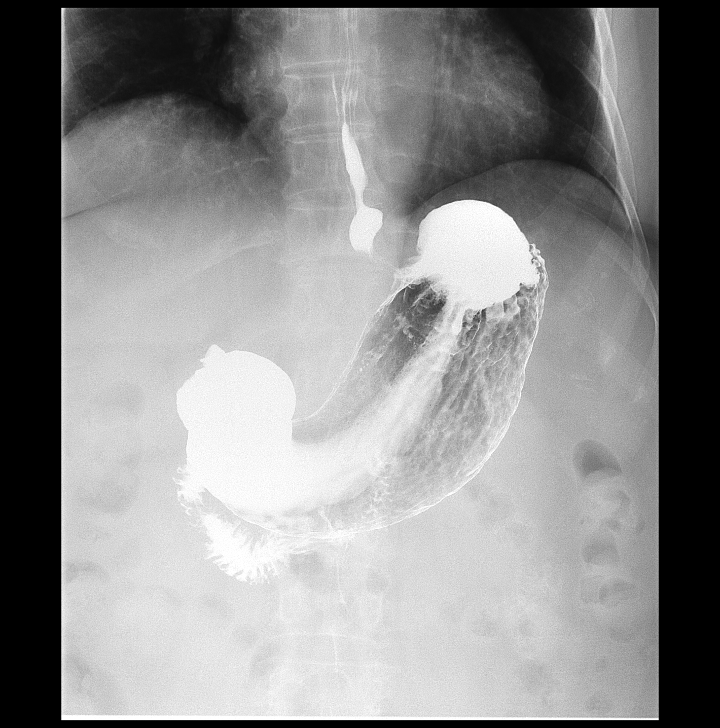

[Series 4: fluoro_barium singleshot_bw · 0.18mm/px · 1 of 1 slices shown (3 of 8)]
[im 1/1]
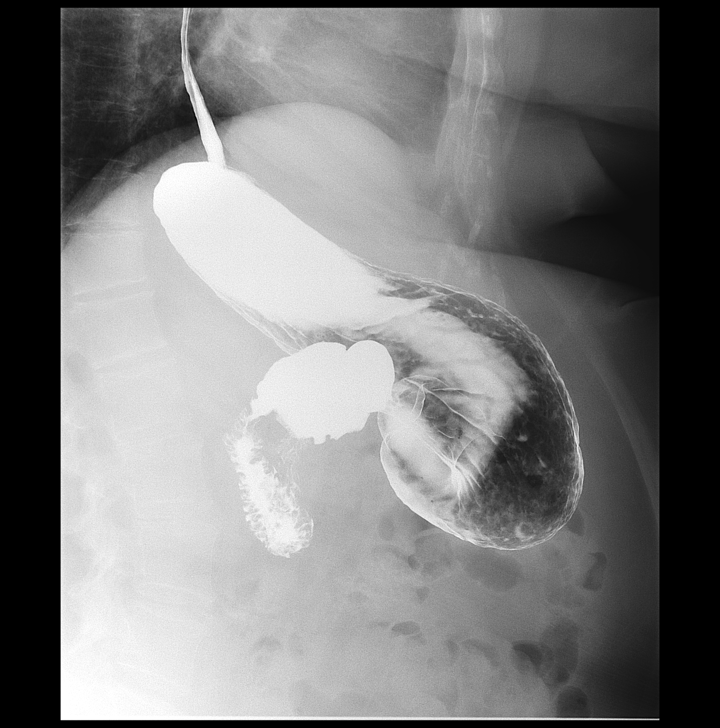

[Series 5: fluoro_barium singleshot_bw · 0.18mm/px · 1 of 1 slices shown (4 of 8)]
[im 1/1]
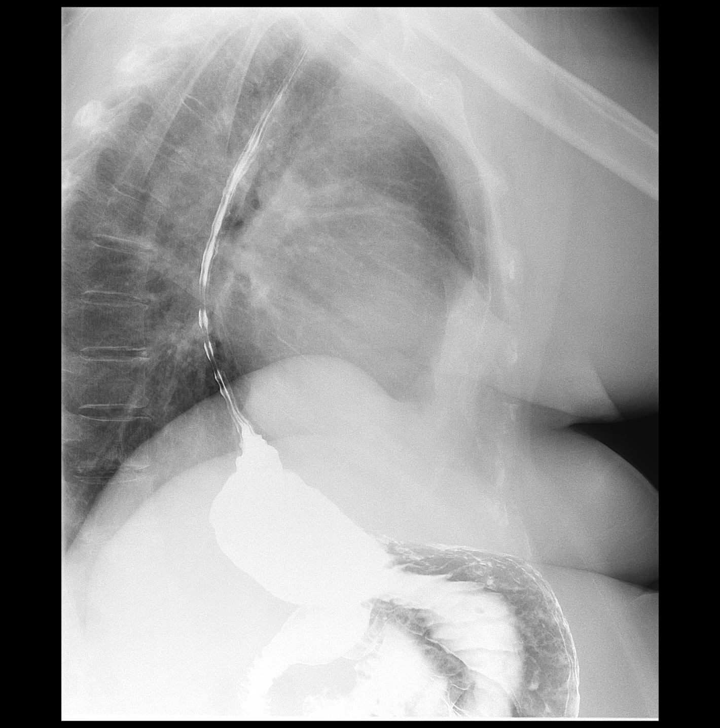

[Series 7: fluoro_barium singleshot_bw · 0.19mm/px · 1 of 1 slices shown (5 of 8)]
[im 1/1]
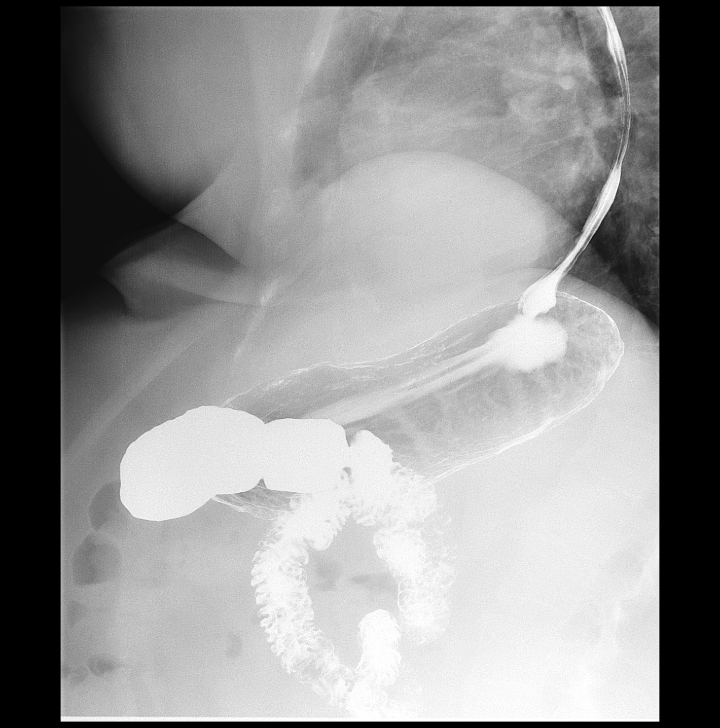

[Series 8: fluoro_barium singleshot_bw · 0.19mm/px · 1 of 1 slices shown (6 of 8)]
[im 1/1]
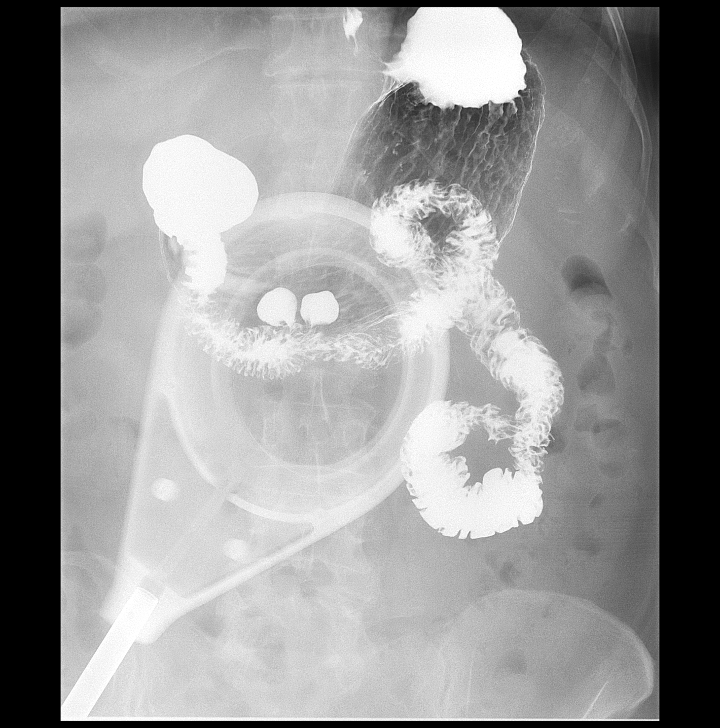

[Series 10: cp_standard · 0.27mm/px · 2 acquisitions, 4 frames shown]
[im 1/2]
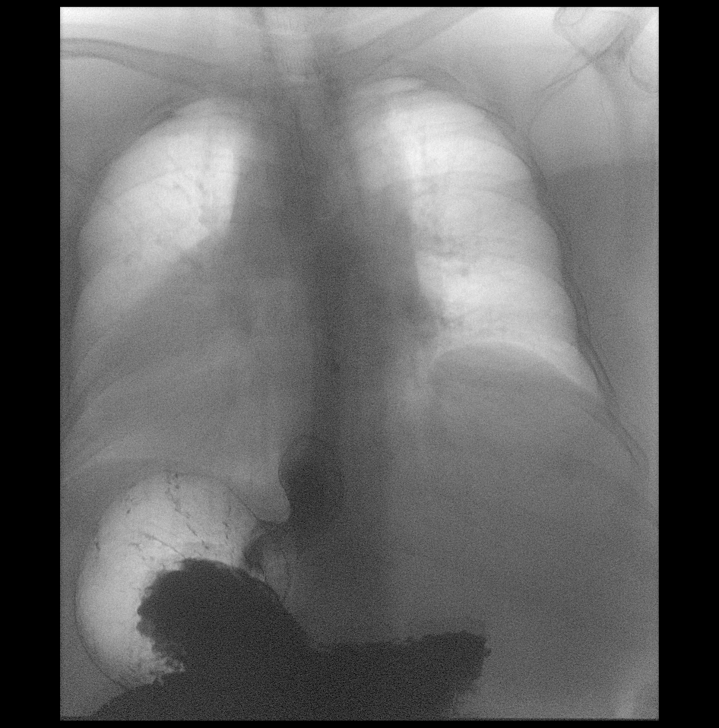
[im 2/2]
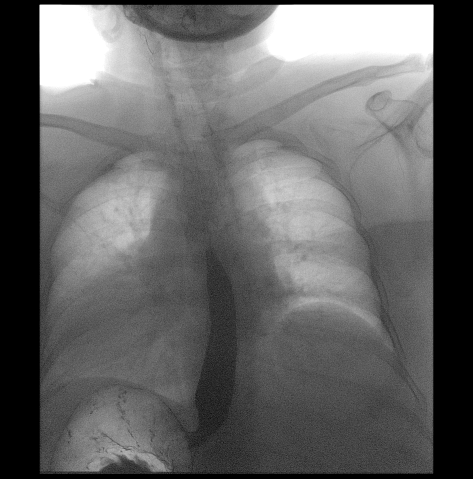
[im 2/2]
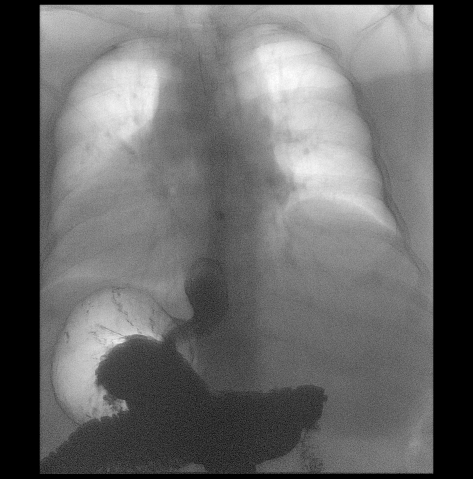
[im 2/2]
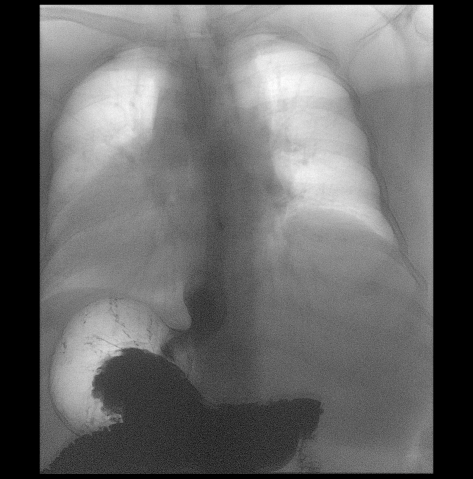

[Series 12: fluoro_barium singleshot_bw · 0.18mm/px · 1 of 1 slices shown (7 of 8)]
[im 1/1]
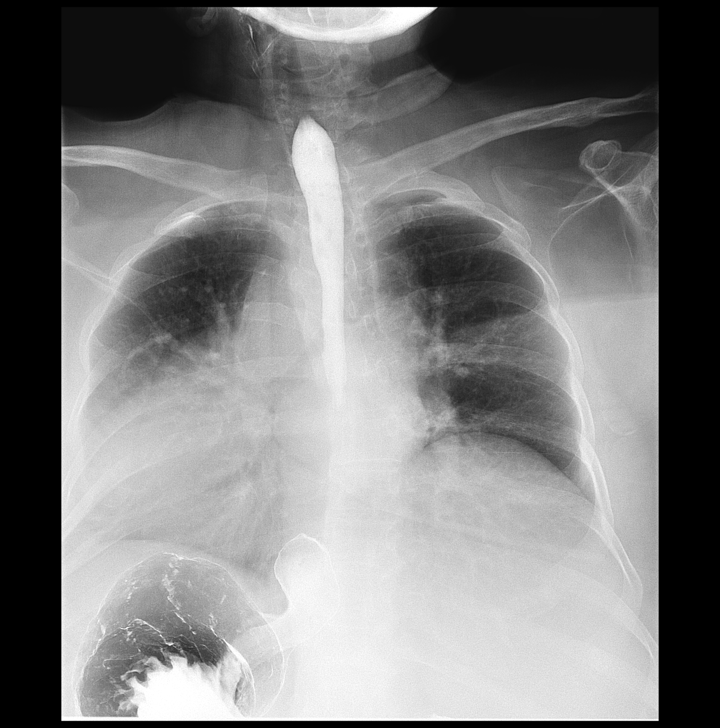

[Series 14: fluoro_barium singleshot_bw · 0.18mm/px · 1 of 1 slices shown (8 of 8)]
[im 1/1]
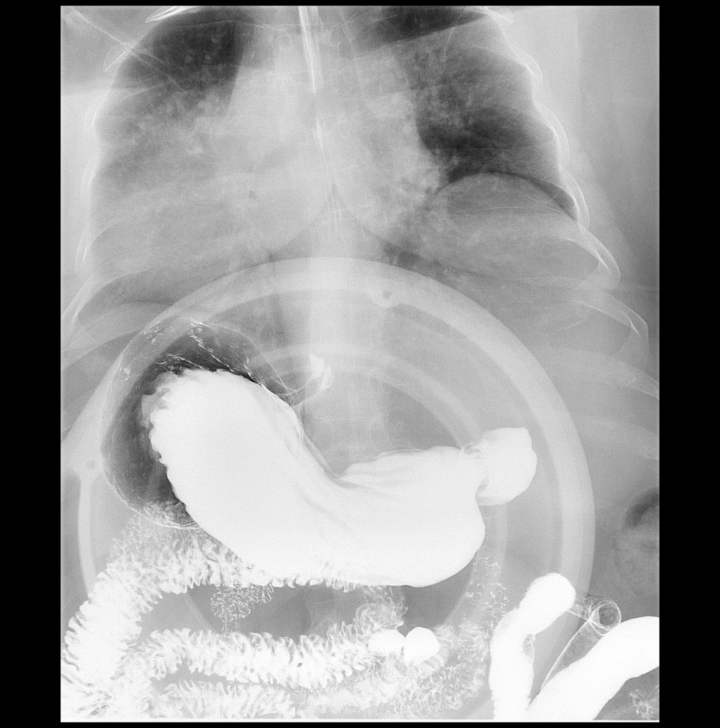

[12 of 17 positions shown; findings below may reference images not displayed]

FINDINGS: The patient ingested thick and thin barium and the gas-forming
crystals without difficulty. The hypopharynx distended well. No
significant cricopharyngeus muscle impression was observed. The
cervical and thoracic esophagus distended well. A moderate-sized non
reducible hiatal hernia was observed. A moderate amount of
spontaneous gastroesophageal reflux to the thoracic inlet was
demonstrated. There was no evidence of stricture nor of esophagitis.
The barium tablet passed promptly from the mouth to the stomach. The
stomach distended well. The mucosal fold pattern was normal. Gastric
emptying was prompt. The duodenal bulb and C sweep were normal in
position. Two diverticula were seen arising from the third portion
of the duodenum.
IMPRESSION: Moderate amount of spontaneous gastroesophageal reflux without
evidence of stricture nor esophagitis.

Small non reducible hiatal hernia.

Normal appearance of the stomach and duodenum. Two duodenal
diverticula measuring up to 1 cm in diameter were observed.

## 2020-02-10 DIAGNOSIS — N83209 Unspecified ovarian cyst, unspecified side: Secondary | ICD-10-CM | POA: Insufficient documentation

## 2020-04-05 DIAGNOSIS — E669 Obesity, unspecified: Secondary | ICD-10-CM | POA: Insufficient documentation

## 2020-04-05 DIAGNOSIS — E66812 Obesity, class 2: Secondary | ICD-10-CM | POA: Insufficient documentation

## 2020-04-07 ENCOUNTER — Other Ambulatory Visit: Payer: Self-pay | Admitting: Primary Care

## 2020-04-07 DIAGNOSIS — J4531 Mild persistent asthma with (acute) exacerbation: Secondary | ICD-10-CM

## 2020-05-19 ENCOUNTER — Other Ambulatory Visit: Payer: Self-pay | Admitting: Primary Care

## 2020-05-19 DIAGNOSIS — J4531 Mild persistent asthma with (acute) exacerbation: Secondary | ICD-10-CM

## 2020-05-19 DIAGNOSIS — J302 Other seasonal allergic rhinitis: Secondary | ICD-10-CM

## 2020-06-02 DIAGNOSIS — E7849 Other hyperlipidemia: Secondary | ICD-10-CM | POA: Insufficient documentation

## 2020-06-09 DIAGNOSIS — M8580 Other specified disorders of bone density and structure, unspecified site: Secondary | ICD-10-CM | POA: Insufficient documentation

## 2020-07-15 ENCOUNTER — Other Ambulatory Visit: Payer: Self-pay

## 2020-07-15 ENCOUNTER — Other Ambulatory Visit: Payer: No Typology Code available for payment source

## 2020-07-15 DIAGNOSIS — Z20822 Contact with and (suspected) exposure to covid-19: Secondary | ICD-10-CM

## 2020-07-17 LAB — NOVEL CORONAVIRUS, NAA: SARS-CoV-2, NAA: NOT DETECTED

## 2020-07-17 LAB — SARS-COV-2, NAA 2 DAY TAT

## 2020-07-30 ENCOUNTER — Other Ambulatory Visit: Payer: No Typology Code available for payment source

## 2020-07-30 DIAGNOSIS — Z20822 Contact with and (suspected) exposure to covid-19: Secondary | ICD-10-CM

## 2020-07-31 ENCOUNTER — Telehealth: Payer: Self-pay | Admitting: Pulmonary Disease

## 2020-07-31 NOTE — Telephone Encounter (Signed)
Primary Pulmonologist: Dr.Mannam Last office visit and with whom: 12/30/19 Karla Turner  What do we see them for (pulmonary problems): Mild persistent asthma Last OV assessment/plan: Assessment & Plan:   Asthma PFTs showed moderate restriction/air trapping  Symptoms well controlled on Breo 100 No recent exacerbations Requiring Albuterol hfa 2-3 times a week when wearing mask  Follow-up in 6 months with new [facility] pulmonary provider  Bronchiectasis without complication ([place]) - Stable; No exacerbations - CT chest consistent with smooth cylindrical bronchiectasis rather than a cystic or mucopurulent variant     [name], NP 12/30/2019     Assessment & Plan Note by [name], NP at 12/30/2019 9:55 AM Author: [name], NP Author Type: Nurse Practitioner Filed: 12/30/2019 9:57 AM  Note Status: [place] List: Cosign Not Required Encounter Date: 12/30/2019  Problem: Bronchiectasis without complication [facility])  Editor: [name], NP (Nurse Practitioner)      Prior Versions: 1. [name], NP (Nurse Practitioner) at 12/30/2019 9:56 AM - Written    - Stable; No exacerbations - CT chest consistent with smooth cylindrical bronchiectasis rather than a cystic or mucopurulent variant    Assessment & Plan Note by [name], NP at 12/30/2019 9:52 AM Author: [name], NP Author Type: Nurse Practitioner Filed: 12/30/2019 9:55 AM  Note Status: Written Cosign: Cosign Not Required Encounter Date: 12/30/2019  Problem: Asthma  Editor: [name], NP (Nurse Practitioner)               PFTs showed moderate restriction/air trapping  Symptoms well controlled on Breo 100 No recent exacerbations Requiring Albuterol hfa 2-3 times a week when wearing mask  Follow-up in 6 months with new [facility] pulmonary provider    Patient Instructions by [name], NP at 12/30/2019 9:00 AM Author: [name], NP Author Type: Nurse Practitioner  Filed: 12/30/2019 9:46 AM  Note Status: Signed Cosign: Cosign Not Required Encounter Date: 12/30/2019  Editor: [name], NP (Nurse Practitioner)               Recommendations: - Continue Breo 100- take one puffs once daily (rinse mouth after) - Use albuterol rescue inhaler 2 puffs every 4 hours as needed for shortness of breath/wheezing - If you develop increased shortness of breath, wheezing, chest tightness or purulent cough call or return to office sooner   Follow-up: - 6 months with Dr. [name] or [name]     Asthma, Adult   Asthma is a long-term (chronic) condition that causes recurrent episodes in which the airways become tight and narrow. The airways are the passages that lead from the nose and mouth down into the lungs. Asthma episodes, also called asthma attacks, can cause coughing, wheezing, shortness of breath, and chest pain. The airways can also fill with mucus. During an attack, it can be difficult to breathe. Asthma attacks can range from minor to life threatening. Asthma cannot be cured, but medicines and lifestyle changes can help control it and treat acute attacks. What are the causes? This condition is believed to be caused by inherited (genetic) and environmental factors, but its exact cause is not known. There are many things that can bring on an asthma attack or make asthma symptoms worse (triggers). Asthma triggers are different for each person. Common triggers include:  Mold.  Dust.  Cigarette smoke.  Cockroaches.  Things that can cause allergy symptoms (allergens), such as animal dander or pollen from trees or grass.  Air pollutants such  as household cleaners, wood smoke, smog, or chemical odors.  Cold air, weather changes, and winds (which increase molds and pollen in the air).  Strong emotional expressions such as crying or laughing hard.  Stress.  Certain medicines (such as aspirin) or types of medicines (such as  beta-blockers).  Sulfites in foods and drinks. Foods and drinks that may contain sulfites include dried fruit, potato chips, and sparkling grape juice.  Infections or inflammatory conditions such as the flu, a cold, or inflammation of the nasal membranes (rhinitis).  Gastroesophageal reflux disease (GERD).  Exercise or strenuous activity. What are the signs or symptoms? Symptoms of this condition may occur right after asthma is triggered or many hours later. Symptoms include:  Wheezing. This can sound like whistling when you breathe.  Excessive nighttime or early morning coughing.  Frequent or severe coughing with a common cold.  Chest tightness.  Shortness of breath.  Tiredness (fatigue) with minimal activity. How is this diagnosed? This condition is diagnosed based on:  Your medical history.  A physical exam.  Tests, which may include: ? Lung function studies and pulmonary studies (spirometry). These tests can evaluate the flow of air in your lungs. ? Allergy tests. ? Imaging tests, such as X-rays. How is this treated? There is no cure for this condition, but treatment can help control your symptoms. Treatment for asthma usually involves:  Identifying and avoiding your asthma triggers.  Using medicines to control your symptoms. Generally, two types of medicines are used to treat asthma: ? Controller medicines. These help prevent asthma symptoms from occurring. They are usually taken every day. ? Fast-acting reliever or rescue medicines. These quickly relieve asthma symptoms by widening the narrow and tight airways. They are used as needed and provide short-term relief.  Using supplemental oxygen. This may be needed during a severe episode.  Using other medicines, such as: ? Allergy medicines, such as antihistamines, if your asthma attacks are triggered by allergens. ? Immune medicines (immunomodulators). These are medicines that help control the immune  system.  Creating an asthma action plan. An asthma action plan is a written plan for managing and treating your asthma attacks. This plan includes: ? A list of your asthma triggers and how to avoid them. ? Information about when medicines should be taken and when their dosage should be changed. ? Instructions about using a device called a peak flow meter. A peak flow meter measures how well the lungs are working and the severity of your asthma. It helps you monitor your condition. Follow these instructions at home: Controlling your home environment Control your home environment in the following ways to help avoid triggers and prevent asthma attacks:  Change your heating and air conditioning filter regularly.  Limit your use of fireplaces and wood stoves.  Get rid of pests (such as roaches and mice) and their droppings.  Throw away plants if you see mold on them.  Clean floors and dust surfaces regularly. Use unscented cleaning products.  Try to have someone else vacuum for you regularly. Stay out of rooms while they are being vacuumed and for a short while afterward. If you vacuum, use a dust mask from a hardware store, a double-layered or microfilter vacuum cleaner bag, or a vacuum cleaner with a HEPA filter.  Replace carpet with wood, tile, or vinyl flooring. Carpet can trap dander and dust.  Use allergy-proof pillows, mattress covers, and box spring covers.  Keep your bedroom a trigger-free room.  Avoid pets and keep windows  closed when allergens are in the air.  Wash beddings every week in hot water and dry them in a dryer.  Use blankets that are made of polyester or cotton.  Clean bathrooms and kitchens with bleach. If possible, have someone repaint the walls in these rooms with mold-resistant paint. Stay out of the rooms that are being cleaned and painted.  Wash your hands often with soap and water. If soap and water are not available, use hand sanitizer.  Do not allow  anyone to smoke in your home. General instructions  Take over-the-counter and prescription medicines only as told by your health care provider. ? Speak with your health care provider if you have questions about how or when to take the medicines. ? Make note if you are requiring more frequent dosages.  Do not use any products that contain nicotine or tobacco, such as cigarettes and e-cigarettes. If you need help quitting, ask your health care provider. Also, avoid being exposed to secondhand smoke.  Use a peak flow meter as told by your health care provider. Record and keep track of the readings.  Understand and use the asthma action plan to help minimize, or stop an asthma attack, without needing to seek medical care.  Make sure you stay up to date on your yearly vaccinations as told by your health care provider. This may include vaccines for the flu and pneumonia.  Avoid outdoor activities when allergen counts are high and when air quality is low.  Wear a ski mask that covers your nose and mouth during outdoor winter activities. Exercise indoors on cold days if you can.  Warm up before exercising, and take time for a cool-down period after exercise.  Keep all follow-up visits as told by your health care provider. This is important. Where to find more information  For information about asthma, turn to the Centers for Disease Control and Prevention at http://www.clark.net/.htm  For air quality information, turn to AirNow at WeightRating.nl Contact a health care provider if:  You have wheezing, shortness of breath, or a cough even while you are taking medicine to prevent attacks.  The mucus you cough up (sputum) is thicker than usual.  Your sputum changes from clear or white to yellow, green, gray, or bloody.  Your medicines are causing side effects, such as a rash, itching, swelling, or trouble breathing.  You need to use a reliever medicine more than 2-3 times a  week.  Your peak flow reading is still at 50-79% of your personal best after following your action plan for 1 hour.  You have a fever. Get help right away if:  You are getting worse and do not respond to treatment during an asthma attack.  You are short of breath when at rest or when doing very little physical activity.  You have difficulty eating, drinking, or talking.  You have chest pain or tightness.  You develop a fast heartbeat or palpitations.  You have a bluish color to your lips or fingernails.  You are light-headed or dizzy, or you faint.  Your peak flow reading is less than 50% of your personal best.  You feel too tired to breathe normally. Summary  Asthma is a long-term (chronic) condition that causes recurrent episodes in which the airways become tight and narrow. These episodes can cause coughing, wheezing, shortness of breath, and chest pain.  Asthma cannot be cured, but medicines and lifestyle changes can help control it and treat acute attacks.  Make sure you understand  how to avoid triggers and how and when to use your medicines.  Asthma attacks can range from minor to life threatening. Get help right away if you have an asthma attack and do not respond to treatment with your usual rescue medicines. This information is not intended to replace advice given to you by your health care provider. Make sure you discuss any questions you have with your health care provider. Document Revised: 12/27/2018 Document Reviewed: 11/28/2016 Elsevier Patient Education  2020 [employer].     Instructions    Return in about 6 months (around 06/28/2020). Recommendations: - Continue Breo 100- take one puffs once daily (rinse mouth after) - Use albuterol rescue inhaler 2 puffs every 4 hours as needed for shortness of breath/wheezing - If you develop increased shortness of breath, wheezing, chest tightness or purulent cough call or return to office sooner   Follow-up: -  6 months with Dr. [name] or [name]     Asthma, Adult   Asthma is a long-term (chronic) condition that causes recurrent episodes in which the airways become tight and narrow. The airways are the passages that lead from the nose and mouth down into the lungs. Asthma episodes, also called asthma attacks, can cause coughing, wheezing, shortness of breath, and chest pain. The airways can also fill with mucus. During an attack, it can be difficult to breathe. Asthma attacks can range from minor to life threatening. Asthma cannot be cured, but medicines and lifestyle changes can help control it and treat acute attacks. What are the causes? This condition is believed to be caused by inherited (genetic) and environmental factors, but its exact cause is not known. There are many things that can bring on an asthma attack or make asthma symptoms worse (triggers). Asthma triggers are different for each person. Common triggers include:  Mold.  Dust.  Cigarette smoke.  Cockroaches.  Things that can cause allergy symptoms (allergens), such as animal dander or pollen from trees or grass.  Air pollutants such as household cleaners, wood smoke, smog, or [employer] account planner.  Cold air, weather changes, and winds (which increase molds and pollen in the air).  Strong emotional expressions such as crying or laughing hard.  Stress.  Certain medicines (such as aspirin) or types of medicines (such as beta-blockers).  Sulfites in foods and drinks. Foods and drinks that may contain sulfites include dried fruit, potato chips, and sparkling grape juice.  Infections or inflammatory conditions such as the flu, a cold, or inflammation of the nasal membranes (rhinitis).  Gastroesophageal reflux disease (GERD).  Exercise or strenuous activity. What are the signs or symptoms? Symptoms of this condition may occur right after asthma is triggered or many hours later. Symptoms include:  Wheezing. This can sound  like whistling when you breathe.  Excessive nighttime or early morning coughing.  Frequent or severe coughing with a common cold.  Chest tightness.  Shortness of breath.  Tiredness (fatigue) with minimal activity. How is this diagnosed? This condition is diagnosed based on:  Your medical history.  A physical exam.  Tests, which may include: ? Lung function studies and pulmonary studies (spirometry). These tests can evaluate the flow of air in your lungs. ? Allergy tests. ? Imaging tests, such as X-rays. How is this treated? There is no cure for this condition, but treatment can help control your symptoms. Treatment for asthma usually involves:  Identifying and avoiding your asthma triggers.  Using medicines to control your symptoms. Generally, two types of medicines are used  to treat asthma: ? Controller medicines. These help prevent asthma symptoms from occurring. They are usually taken every day. ? Fast-acting reliever or rescue medicines. These quickly relieve asthma symptoms by widening the narrow and tight airways. They are used as needed and provide short-term relief.  Using supplemental oxygen. This may be needed during a severe episode.  Using other medicines, such as: ? Allergy medicines, such as antihistamines, if your asthma attacks are triggered by allergens. ? Immune medicines (immunomodulators). These are medicines that help control the immune system.  Creating an asthma action plan. An asthma action plan is a written plan for managing and treating your asthma attacks. This plan includes: ? A list of your asthma triggers and how to avoid them. ? Information about when medicines should be taken and when their dosage should be changed. ? Instructions about using a device called a peak flow meter. A peak flow meter measures how well the lungs are working and the severity of your asthma. It helps you monitor your condition. Follow these instructions at  home: Controlling your home environment Control your home environment in the following ways to help avoid triggers and prevent asthma attacks:  Change your heating and air conditioning filter regularly.  Limit your use of fireplaces and wood stoves.  Get rid of pests (such as roaches and mice) and their droppings.  Throw away plants if you see mold on them.  Clean floors and dust surfaces regularly. Use unscented cleaning products.  Try to have someone else vacuum for you regularly. Stay out of rooms while they are being vacuumed and for a short while afterward. If you vacuum, use a dust mask from a hardware store, a double-layered or microfilter vacuum cleaner bag, or a vacuum cleaner with a HEPA filter.  Replace carpet with wood, tile, or vinyl flooring. Carpet can trap dander and dust.  Use allergy-proof pillows, mattress covers, and box spring covers.  Keep your bedroom a trigger-free room.  Avoid pets and keep windows closed when allergens are in the air.  Wash beddings every week in hot water and dry them in a dryer.  Use blankets that are made of polyester or cotton.  Clean bathrooms and kitchens with bleach. If possible, have someone repaint the walls in these rooms with mold-resistant paint. Stay out of the rooms that are being cleaned and painted.  Wash your hands often with soap and water. If soap and water are not available, use hand sanitizer.  Do not allow anyone to smoke in your home. General instructions  Take over-the-counter and prescription medicines only as told by your health care provider. ? Speak with your health care provider if you have questions about how or when to take the medicines. ? Make note if you are requiring more frequent dosages.  Do not use any products that contain nicotine or tobacco, such as cigarettes and e-cigarettes. If you need help quitting, ask your health care provider. Also, avoid being exposed to secondhand smoke.  Use a peak  flow meter as told by your health care provider. Record and keep track of the readings.  Understand and use the asthma action plan to help minimize, or stop an asthma attack, without needing to seek medical care.  Make sure you stay up to date on your yearly vaccinations as told by your health care provider. This may include vaccines for the flu and pneumonia.  Avoid outdoor activities when allergen counts are high and when air quality is low.  Wear  a ski mask that covers your nose and mouth during outdoor winter activities. Exercise indoors on cold days if you can.  Warm up before exercising, and take time for a cool-down period after exercise.  Keep all follow-up visits as told by your health care provider. This is important. Where to find more information  For information about asthma, turn to the Centers for Disease Control and Prevention at http://www.clark.net/.htm  For air quality information, turn to AirNow at WeightRating.nl Contact a health care provider if:  You have wheezing, shortness of breath, or a cough even while you are taking medicine to prevent attacks.  The mucus you cough up (sputum) is thicker than usual.  Your sputum changes from clear or white to yellow, green, gray, or bloody.  Your medicines are causing side effects, such as a rash, itching, swelling, or trouble breathing.  You need to use a reliever medicine more than 2-3 times a week.  Your peak flow reading is still at 50-79% of your personal best after following your action plan for 1 hour.  You have a fever. Get help right away if:  You are getting worse and do not respond to treatment during an asthma attack.  You are short of breath when at rest or when doing very little physical activity.  You have difficulty eating, drinking, or talking.  You have chest pain or tightness.  You develop a fast heartbeat or palpitations.  You have a bluish color to your lips or fingernails.  You  are light-headed or dizzy, or you faint.  Your peak flow reading is less than 50% of your personal best.  You feel too tired to breathe normally. Summary  Asthma is a long-term (chronic) condition that causes recurrent episodes in which the airways become tight and narrow. These episodes can cause coughing, wheezing, shortness of breath, and chest pain.  Asthma cannot be cured, but medicines and lifestyle changes can help control it and treat acute attacks.  Make sure you understand how to avoid triggers and how and when to use your medicines.  Asthma attacks can range from minor to life threatening. Get help right away if you have an asthma attack and do not respond to treatment with your usual rescue medicines. This information is not intended to replace advice given to you by your health care provider. Make sure you discuss any questions you have with your health care provider. Document Revised: 12/27/2018 Document Reviewed: 11/28/2016 Elsevier Patient Education  2020 [employer].         After Visit Summary (Printed 12/30/2019) Media From this encounter Electronic signature on 12/30/2019 9:11 AM - E-signed  Communication Routing History  None  No questionnaires available.        Was appointment offered to patient (explain)?     Reason for call:Patient has cough, SOB, fever,sore/scratchy throat, congestion, runny nose, etc. Did go get a covid test, results are not back.    Karla Turner can you please advise.  Thank you   Allergies  Allergen Reactions  . White Petrolatum     Other reaction(s): Other (See Comments) Pt states that she used Invisalign (which she states is a petroleum product and afterward developed systemic "poisoning."    Immunization History  Administered Date(s) Administered  . Fluad Quad(high Dose 65+) 08/27/2019  . Influenza, High Dose Seasonal PF 09/07/2018  . Influenza-Unspecified 09/05/2018  . PFIZER SARS-COV-2 Vaccination 12/15/2019, 01/08/2020   . Pneumococcal Conjugate-13 09/07/2018  . Pneumococcal-Unspecified 09/09/2014

## 2020-07-31 NOTE — Telephone Encounter (Signed)
Spoke with patient regarding prior message. Per Beth Recommend waiting for confirmed covid testing. No medication indicated. Take tylenol 650mg  q 6 hours for fever and drink plenty of fluid. UC if symptoms worsen.Patient's voice was understanding. Nothing else further needed.

## 2020-07-31 NOTE — Telephone Encounter (Signed)
Recommend waiting for confirmed covid testing. No medication indicated. Take tylenol 650mg  q 6 hours for fever and drink plenty of fluid. UC if symptoms worsen.

## 2020-08-01 LAB — NOVEL CORONAVIRUS, NAA: SARS-CoV-2, NAA: NOT DETECTED

## 2020-08-01 LAB — SARS-COV-2, NAA 2 DAY TAT

## 2020-08-03 ENCOUNTER — Telehealth: Payer: Self-pay | Admitting: Pulmonary Disease

## 2020-08-03 NOTE — Telephone Encounter (Signed)
Called and spoke with patient she states that she was tested for Covid last Thursday and it came back Negative. Patient has temps of 99.9,100.3, cough with green phlegm, runny nose with green. Thinks that she may have Bronchitis because she always gets it and gets similar symptoms. She wants to know if there is anything else she needs to do? If there is any meds that she needs to be taking?   Beth please advise

## 2020-08-04 ENCOUNTER — Encounter: Payer: Self-pay | Admitting: Primary Care

## 2020-08-04 ENCOUNTER — Ambulatory Visit (INDEPENDENT_AMBULATORY_CARE_PROVIDER_SITE_OTHER): Payer: No Typology Code available for payment source | Admitting: Primary Care

## 2020-08-04 ENCOUNTER — Telehealth: Payer: Self-pay | Admitting: Primary Care

## 2020-08-04 DIAGNOSIS — J471 Bronchiectasis with (acute) exacerbation: Secondary | ICD-10-CM

## 2020-08-04 DIAGNOSIS — J4540 Moderate persistent asthma, uncomplicated: Secondary | ICD-10-CM | POA: Diagnosis not present

## 2020-08-04 MED ORDER — AMOXICILLIN-POT CLAVULANATE 875-125 MG PO TABS: 1.0000 | ORAL_TABLET | Freq: Two times a day (BID) | ORAL | 0 refills | Status: DC

## 2020-08-04 MED ORDER — PREDNISONE 10 MG PO TABS: ORAL_TABLET | ORAL | 0 refills | Status: DC

## 2020-08-04 NOTE — Progress Notes (Signed)
Virtual Visit via Telephone Note  I connected with Karla Turner on 08/04/20 at 12:00 PM EDT by telephone and verified that I am speaking with the correct person using two identifiers.  Location: Patient: Home Provider: Office   I discussed the limitations, risks, security and privacy concerns of performing an evaluation and management service by telephone and the availability of in person appointments. I also discussed with the patient that there may be a patient responsible charge related to this service. The patient expressed understanding and agreed to proceed.   History of Present Illness: 69 year old female, never smoked (exposed to second hand smoke). PMH significant for asthma, bronchiectasis, obesity, glaucoma, positive ANA, dyslipidemia, GERD. Patient of Dr. Lake Bells, seen for initial consult on 01/08/19. History of pneumonia as child. Alpha-1 antitrypsin and immunodeficency labs were normal. FENO normal. PFTs showed evidence of moderate restriction and air trapping consistent with asthma. Maintained on Breo 100 and prn albuterol.   Previous [facility] pulmonary encounter: 3/3/20Lake Bells, MD This is a pleasant 69 year old female who comes to my clinic today to establish care for repeated episodes of bronchitis ever since having childhood pneumonia.  She has increased airway caliber on my review of her CT chest consistent with bronchiectasis but this does seem more like a smooth cylindrical bronchiectasis rather than a cystic or mucopurulent variant.  I wonder about the possibility of concomitant asthma and I also wonder about some problems with her immune system such as immunoglobulin deficiency or an underlying alpha-1 antitrypsin deficiency.  06/28/2019 Patient presents today for follow-up with PFTs. States that she is doing well, her breathing has improved since being on Breo. She is using Breo 1 puff daily and albuterol nebulizer in the evening. Asthma currently being managed by her PCP.  States that she requires frequent prednisone and antibiotics for bronchitis. Thinks she may have been exposed to covid in [place] back in [place].   12/30/2019 Patient presents today for 6 months follow-up. Breathing has been well controlled on Breo. Only time she required albuterol rescue inhaler is before she has to go out wearing mask d/t covid. She feels her symptoms are much better than last spring. In Feb 2020 she required 3-4 round of antibiotics for bronchitis symptoms. She has had no exacerbation requiring abx or prednisone. She received first pfizer covid vaccine, she will receive her 2nd shot on March 3rd.   08/04/2020- Interim hx Patient contacted today for acute televisit bronchitis. Hx asthma and bronchiectasis. She reports symptoms of sore throat, nasal congestion, cough with green sputum and low grade fever x 1 week. She was at the beach prior to her symptoms starting and did not take her allergy medication for 4 days. She tested negative for Covid on 07/30/20. She continue to have low grade temperature 99.1 today. She has been using her Albuterol nebulizer with some improvement in her symptoms. She is compliant with BREO and reports that it has been "marvelous" addition for her.    Observations/Objective:  - Patient is able to speak in full sentences; no overt shortness of breath or wheezing  TESTING: 06/28/2019 PFTS- FVC 1.87 (62%), FEV1 1.41 (61%), ratio 76, TLC 80, DLCOunc 18.74 (98%)/ Moderate restriction with midflow reversibiliy  Chest imaging: November 2019 CT chest images independently reviewed showing no pulmonary parenchymal abnormality with the exception of increased airway caliber specifically in the lower lobes bilaterally but no mucus plugging, no pulmonary parenchymal infiltrate, no masses. CT chest consistent with smooth cylindrical bronchiectasis rather than a cystic or  mucopurulent variant.  FENO: 01/2019 11 ppb  Assessment and Plan:  Bronchiectasis  exacerbation: - CT imaging in 2019 showed smooth cylindrical bronchiectasis. Alpha-1 antitrypsin and immunodeficency labs were normal - Productive cough x 1 week with low grade fever. Covid negative 07/30/20 - Rx Augmentin 1 tab twice daily x 7 days; prednisone taper (40mg  x 2 days; 30mg  x 2 days; 20mg  x 2 days; 10mg  x 2 days) - Recommend patient start Mucinex 600mg  twice daily - If no improvement instructed patient to notify office and will need CXR   Asthma: - PFTs showed evidence of moderate restriction and air trapping consistent with asthma - Continue Breo 100 one puff daily; albuterol nebulizer every 6 hours as needed for breakthrough shortness of breath/wheezing  Follow Up Instructions:  6-8 week with Dr. [name] (new patient bronchiectasis)/ or sooner if symptoms do not improve   I discussed the assessment and treatment plan with the patient. The patient was provided an opportunity to ask questions and all were answered. The patient agreed with the plan and demonstrated an understanding of the instructions.   The patient was advised to call back or seek an in-person evaluation if the symptoms worsen or if the condition fails to improve as anticipated.  I provided 25 minutes of non-face-to-face time during this encounter.   [name], NP

## 2020-08-04 NOTE — Telephone Encounter (Signed)
Called patient to give BW's recommendations, patient is upset that she knows her body and knows what is wrong and doesn't agree. She is also upset that she keeps talking to different people on the phone. She would rather talk to the provider and discuss her needs.   Patient set up with televisit today at 12 with Derl Barrow  Nothing further needed at this time.

## 2020-08-04 NOTE — Patient Instructions (Addendum)
Recommendations: Start Augmentin 1 tab twice daily x 7 days Start prednisone taper as prescribed Start Mucinex 600mg  twice daily Continue Breo 100 one puff daily (rinse mouth after use) Use albuterol nebulizer every 6 hours as needed for breakthrough shortness of breath/wheezing  Follow-up: 6-8 week with Dr. [name] (new patient bronchiectasis)

## 2020-08-04 NOTE — Telephone Encounter (Signed)
Glad her covid came back negative. I will send in abx for bronchiectasis exacerbation. Take with food. Take muciex twice daily for 10 days.

## 2020-08-04 NOTE — Telephone Encounter (Signed)
Called and spoke with patient she states that everything has been taken care of and she has already started taking medication. Informed patient to let us know if she needs anything else. Nothing further needed at this time.

## 2020-08-17 ENCOUNTER — Other Ambulatory Visit: Payer: Self-pay

## 2020-08-17 ENCOUNTER — Encounter: Payer: Self-pay | Admitting: Pulmonary Disease

## 2020-08-17 ENCOUNTER — Ambulatory Visit: Payer: No Typology Code available for payment source | Admitting: Pulmonary Disease

## 2020-08-17 VITALS — BP 120/70 | HR 86 | Temp 97.2°F | Ht 64.0 in | Wt 207.2 lb

## 2020-08-17 DIAGNOSIS — J4520 Mild intermittent asthma, uncomplicated: Secondary | ICD-10-CM

## 2020-08-17 DIAGNOSIS — Z23 Encounter for immunization: Secondary | ICD-10-CM

## 2020-08-17 MED ORDER — ALBUTEROL SULFATE HFA 108 (90 BASE) MCG/ACT IN AERS: 2.0000 | INHALATION_SPRAY | Freq: Four times a day (QID) | RESPIRATORY_TRACT | 11 refills | Status: DC | PRN

## 2020-08-17 NOTE — Patient Instructions (Addendum)
Continue breo and the allergy medicines!  We administered the flu vaccine today. The CDC says it is safe to give both this and the Covid vaccine at the same time. It would be reasonable to wait a week or two to make sure the flu vaccine gives you no symptoms before getting the Covid booster.   I refilled the albuterol inhaler.  We will see you back in 6 months with Dr. Silas Flood.

## 2020-08-17 NOTE — Progress Notes (Signed)
Patient ID: Karla Turner, female    DOB: [birth year], 69 y.o.   MRN: [id number]  Chief Complaint  Patient presents with  . New Patient (Initial Visit)    asking about booster.  needs script for albuterol inhaler.      Referring provider: No ref. provider found  HPI 69 year old female, never smoked (exposed to second hand smoke). PMH significant for asthma, bronchiectasis, COPD, obesity, glaucoma, positive ANA, dyslipidemia, GERD. Patient of Dr. Lake Bells, seen for initial consult on 01/08/19. History of pneumonia as child. Alpha-1 antitrypsin and immunodeficency labs were normal. FENO normal. PFTs showed evidence of moderate restriction and air trapping consistent with asthma. Maintained on Breo 100 and prn albuterol. Now established with Dr. Silas Flood.   08/17/20 Today she feels well. Was at the beach last week of September with her dog. Did not take her allergy meds for a few days. Developed nasal congestion, rhinorrhea and cough. Then got SOB and started coughing more with change to green sputum. Did televisit with Derl Barrow and prescribed prednisone burst and Augmentin x 7 days. This greatly imrproved symptoms. Now resolved. Back to taking her allergy meds. Using breo daily.   Previous [facility] encounter: 3/3/20[place] Bells, MD This is a pleasant 69 year old female who comes to my clinic today to establish care for repeated episodes of bronchitis ever since having childhood pneumonia.  She has increased airway caliber on my review of her CT chest consistent with bronchiectasis but this does seem more like a smooth cylindrical bronchiectasis rather than a cystic or mucopurulent variant.  I wonder about the possibility of concomitant asthma and I also wonder about some problems with her immune system such as immunoglobulin deficiency or an underlying alpha-1 antitrypsin deficiency.  06/28/2019 Patient presents today for follow-up with PFTs. States that she is doing well, her breathing has  improved since being on Breo. She is using Breo 1 puff daily and albuterol nebulizer in the evening. Asthma currently being managed by her PCP. States that she requires frequent prednisone and antibiotics for bronchitis. Thinks she may have been exposed to covid in [place] back in [place].   12/30/2019 Patient presents today for 6 months follow-up. Breathing has been well controlled on Breo. Only time she required albuterol rescue inhaler is before she has to go out wearing mask d/t covid. She feels her symptoms are much better than last spring. In Feb 2020 she required 3-4 round of antibiotics for bronchitis symptoms. She has had no exacerbation requiring abx or prednisone. She received first pfizer covid vaccine, she will receive her 2nd shot on March 3rd.   06/28/2019  PFTS- FVC 1.87 (62%), FEV1 1.41 (61%), ratio 76, TLC 80, normal DLCO   Chest imaging: November 2019 CT chest images independently reviewed showing no pulmonary parenchymal abnormality with the exception of increased airway caliber specifically in the lower lobes bilaterally but no mucus plugging, no pulmonary parenchymal infiltrate, no masses. CT chest consistent with smooth cylindrical bronchiectasis rather than a cystic or mucopurulent variant.  FENO: 01/2019 11 ppb  Allergies  Allergen Reactions  . White Petrolatum     Other reaction(s): Other (See Comments) Pt states that she used Invisalign (which she states is a petroleum product and afterward developed systemic "poisoning."    Immunization History  Administered Date(s) Administered  . Fluad Quad(high Dose 65+) 08/27/2019  . Influenza, High Dose Seasonal PF 09/07/2018  . Influenza-Unspecified 09/05/2018  . PFIZER SARS-COV-2 Vaccination 12/15/2019, 01/08/2020  . Pneumococcal Conjugate-13 09/07/2018  .  Pneumococcal Polysaccharide-23 01/20/2020  . Pneumococcal-Unspecified 09/09/2014  . Tdap 06/09/2020    Past Medical History:  Diagnosis Date  . Asthma   .  Bronchitis   . Cataracts, bilateral   . Colon polyps   . Depression   . Diverticulitis   . GERD (gastroesophageal reflux disease)   . Glaucoma   . Hyperlipidemia   . Hypothyroidism   . Lumbar herniated disc     Tobacco History: Social History   Tobacco Use  Smoking Status Never Smoker  Smokeless Tobacco Never Used   Counseling given: Not Answered   Outpatient Medications Prior to Visit  Medication Sig Dispense Refill  . albuterol (PROVENTIL) (2.5 MG/3ML) 0.083% nebulizer solution Take 3 mLs (2.5 mg total) by nebulization every 6 (six) hours as needed for wheezing or shortness of breath. 150 mL 1  . BREO ELLIPTA 100-25 MCG/INH AEPB INHALE ONE DOSE BY MOUTH DAILY 180 each 1  . brinzolamide (AZOPT) 1 % ophthalmic suspension Place 1 drop into the right eye 3 (three) times daily.    . Levothyroxine Sodium (TIROSINT) 100 MCG CAPS TAKE ONE CAPSULE BY MOUTH EVERY MORNING ON AN EMPTY STOMACH 90 capsule 10  . metFORMIN (GLUCOPHAGE) 500 MG tablet Take 1 tablet by mouth 2 (two) times daily with a meal.    . methazolamide (NEPTAZANE) 25 MG tablet Take 25 mg by mouth 3 (three) times daily.    . montelukast (SINGULAIR) 10 MG tablet TAKE ONE TABLET BY MOUTH AT BEDTIME 90 tablet 2  . omeprazole (PRILOSEC) 20 MG capsule Take 1 capsule (20 mg total) by mouth daily. 90 capsule 1  . albuterol (VENTOLIN HFA) 108 (90 Base) MCG/ACT inhaler Inhale 2 puffs into the lungs every 6 (six) hours as needed for wheezing or shortness of breath.    . budesonide (PULMICORT) 0.25 MG/2ML nebulizer solution TAKE 1 VIAL BY NEBULIZATION TWO TIMES A DAY AS NEEDED (Patient not taking: Reported on 08/17/2020) 60 mL 4  . amoxicillin-clavulanate (AUGMENTIN) 875-125 MG tablet Take 1 tablet by mouth 2 (two) times daily. (Patient not taking: Reported on 08/04/2020) 14 tablet 0  . predniSONE (DELTASONE) 10 MG tablet Take 4 tabs po daily x 2 days; then 3 tabs for 2 days; then 2 tabs for 2 days; then 1 tab for 2 days (Patient not  taking: Reported on 08/17/2020) 20 tablet 0   No facility-administered medications prior to visit.    Review of Systems  Review of Systems  Constitutional: Negative.   Respiratory: Negative for cough, chest tightness, shortness of breath and wheezing.   Cardiovascular: Negative.     Physical Exam  BP 120/70 (BP Location: Right Arm, Patient Position: Sitting, Cuff Size: Large)   Pulse 86   Temp (!) 97.2 F (36.2 C) (Temporal)   Ht 5\' 4"  (1.626 m)   Wt 207 lb 3.2 oz (94 kg)   SpO2 97%   BMI 35.57 kg/m  General: well appearing, in NAD Respiratory: CTAB, no wheeze, good air movement Cardiovascular: RRR, no murmurs    Lab Results: Reviewed no elevated Eos CBC    Component Value Date/Time   WBC 5.9 08/27/2019 0840   RBC 4.76 08/27/2019 0840   HGB 14.4 08/27/2019 0840   HGB 13.8 08/24/2018 1111   HCT 43.8 08/27/2019 0840   HCT 41.8 08/24/2018 1111   PLT 221.0 08/27/2019 0840   PLT 218 08/24/2018 1111   MCV 91.9 08/27/2019 0840   MCV 91 08/24/2018 1111   MCV 88 02/21/2013 2153   MCH  30.1 08/24/2018 1111   MCH 31.1 07/20/2017 0827   MCHC 32.9 08/27/2019 0840   RDW 13.5 08/27/2019 0840   RDW 12.3 08/24/2018 1111   RDW 12.4 02/21/2013 2153   LYMPHSABS 1.3 08/27/2019 0840   LYMPHSABS 2.0 08/24/2018 1111   MONOABS 0.5 08/27/2019 0840   EOSABS 0.1 08/27/2019 0840   EOSABS 0.1 08/24/2018 1111   BASOSABS 0.1 08/27/2019 0840   BASOSABS 0.1 08/24/2018 1111    BMET    Component Value Date/Time   NA 139 08/27/2019 0840   NA 142 08/24/2018 1111   NA 139 02/21/2013 2153   K 4.0 08/27/2019 0840   K 3.7 02/21/2013 2153   CL 103 08/27/2019 0840   CL 108 (H) 02/21/2013 2153   CO2 29 08/27/2019 0840   CO2 27 02/21/2013 2153   GLUCOSE 98 08/27/2019 0840   GLUCOSE 135 (H) 02/21/2013 2153   BUN 12 08/27/2019 0840   BUN 16 08/24/2018 1111   BUN 12 02/21/2013 2153   CREATININE 0.79 08/27/2019 0840   CREATININE 0.67 02/21/2013 2153   CALCIUM 9.2 08/27/2019 0840    CALCIUM 8.5 02/21/2013 2153   GFRNONAA 75 08/24/2018 1111   GFRNONAA >60 02/21/2013 2153   GFRAA 87 08/24/2018 1111   GFRAA >60 02/21/2013 2153    BNP No results found for: BNP  ProBNP No results found for: PROBNP  Imaging: CT chest 09/2018: Mild basilar predominant bronchiectasis on my interperation   Assessment & Plan:   Recent bronchitis/bronchiectasis/Asthma flare: Triggered by nasal allergies. Improved with steroids/abx.  Asthma: Well controlled in general on Breo. Stressed importance of adherence to allergy regimen. Provided refill of albuterol inhaler. Has PRN nebs as well.   Bronchiectasis: mild. No sputum. CTM.    Lanier Clam, MD 08/17/2020

## 2020-08-24 ENCOUNTER — Ambulatory Visit: Payer: No Typology Code available for payment source | Attending: Internal Medicine

## 2020-08-24 DIAGNOSIS — Z23 Encounter for immunization: Secondary | ICD-10-CM

## 2020-08-24 NOTE — Progress Notes (Signed)
   Covid-19 Vaccination Clinic  Name:  Karla Turner    MRN: [id number] DOB: December 17, 1950  08/24/2020  Karla Turner was observed post Covid-19 immunization for 15 minutes without incident. She was provided with Vaccine Information Sheet and instruction to access the V-Safe system.   Karla Turner was instructed to call 911 with any severe reactions post vaccine: Marland Kitchen Difficulty breathing  . Swelling of face and throat  . A fast heartbeat  . A bad rash all over body  . Dizziness and weakness

## 2020-08-31 ENCOUNTER — Other Ambulatory Visit: Payer: Self-pay

## 2020-09-02 ENCOUNTER — Ambulatory Visit: Payer: No Typology Code available for payment source | Admitting: Nurse Practitioner

## 2020-09-02 ENCOUNTER — Other Ambulatory Visit: Payer: Self-pay

## 2020-09-02 ENCOUNTER — Encounter: Payer: Self-pay | Admitting: Nurse Practitioner

## 2020-09-02 VITALS — BP 122/78 | HR 64 | Temp 97.4°F | Ht 64.0 in | Wt 207.0 lb

## 2020-09-02 DIAGNOSIS — M816 Localized osteoporosis [Lequesne]: Secondary | ICD-10-CM | POA: Diagnosis not present

## 2020-09-02 DIAGNOSIS — E063 Autoimmune thyroiditis: Secondary | ICD-10-CM

## 2020-09-02 DIAGNOSIS — E669 Obesity, unspecified: Secondary | ICD-10-CM

## 2020-09-02 DIAGNOSIS — R7301 Impaired fasting glucose: Secondary | ICD-10-CM | POA: Insufficient documentation

## 2020-09-02 DIAGNOSIS — E038 Other specified hypothyroidism: Secondary | ICD-10-CM

## 2020-09-02 DIAGNOSIS — Z86010 Personal history of colonic polyps: Secondary | ICD-10-CM

## 2020-09-02 DIAGNOSIS — Z23 Encounter for immunization: Secondary | ICD-10-CM

## 2020-09-02 DIAGNOSIS — J479 Bronchiectasis, uncomplicated: Secondary | ICD-10-CM

## 2020-09-02 DIAGNOSIS — E6609 Other obesity due to excess calories: Secondary | ICD-10-CM | POA: Insufficient documentation

## 2020-09-02 DIAGNOSIS — Z0000 Encounter for general adult medical examination without abnormal findings: Secondary | ICD-10-CM | POA: Insufficient documentation

## 2020-09-02 DIAGNOSIS — K219 Gastro-esophageal reflux disease without esophagitis: Secondary | ICD-10-CM | POA: Diagnosis not present

## 2020-09-02 LAB — CBC WITH DIFFERENTIAL/PLATELET
Basophils Absolute: 0.1 10*3/uL (ref 0.0–0.1)
Basophils Relative: 1.1 % (ref 0.0–3.0)
Eosinophils Absolute: 0.1 10*3/uL (ref 0.0–0.7)
Eosinophils Relative: 1.1 % (ref 0.0–5.0)
HCT: 42.5 % (ref 36.0–46.0)
Hemoglobin: 14 g/dL (ref 12.0–15.0)
Lymphocytes Relative: 25.9 % (ref 12.0–46.0)
Lymphs Abs: 1.4 10*3/uL (ref 0.7–4.0)
MCHC: 33 g/dL (ref 30.0–36.0)
MCV: 90.9 fl (ref 78.0–100.0)
Monocytes Absolute: 0.5 10*3/uL (ref 0.1–1.0)
Monocytes Relative: 8.9 % (ref 3.0–12.0)
Neutro Abs: 3.5 10*3/uL (ref 1.4–7.7)
Neutrophils Relative %: 63 % (ref 43.0–77.0)
Platelets: 215 10*3/uL (ref 150.0–400.0)
RBC: 4.67 Mil/uL (ref 3.87–5.11)
RDW: 14 % (ref 11.5–15.5)
WBC: 5.5 10*3/uL (ref 4.0–10.5)

## 2020-09-02 LAB — HEMOGLOBIN A1C: Hgb A1c MFr Bld: 5.5 % (ref 4.6–6.5)

## 2020-09-02 LAB — B12 AND FOLATE PANEL
Folate: 10.6 ng/mL (ref >5.9)
Vitamin B-12: 308 pg/mL (ref 211–911)

## 2020-09-02 LAB — LIPID PANEL
Cholesterol: 234 mg/dL — ABNORMAL HIGH (ref 0–200)
HDL: 65.6 mg/dL (ref >39.00)
LDL Cholesterol: 135 mg/dL — ABNORMAL HIGH (ref 0–99)
NonHDL: 168.12
Total CHOL/HDL Ratio: 4
Triglycerides: 164 mg/dL — ABNORMAL HIGH (ref 0.0–149.0)
VLDL: 32.8 mg/dL (ref 0.0–40.0)

## 2020-09-02 LAB — COMPREHENSIVE METABOLIC PANEL
ALT: 13 U/L (ref 0–35)
AST: 17 U/L (ref 0–37)
Albumin: 4.3 g/dL (ref 3.5–5.2)
Alkaline Phosphatase: 68 U/L (ref 39–117)
BUN: 13 mg/dL (ref 6–23)
CO2: 29 mEq/L (ref 19–32)
Calcium: 9.2 mg/dL (ref 8.4–10.5)
Chloride: 104 mEq/L (ref 96–112)
Creatinine, Ser: 0.74 mg/dL (ref 0.40–1.20)
GFR: 83.49 mL/min (ref >60.00)
Glucose, Bld: 94 mg/dL (ref 70–99)
Potassium: 4 mEq/L (ref 3.5–5.1)
Sodium: 141 mEq/L (ref 135–145)
Total Bilirubin: 0.6 mg/dL (ref 0.2–1.2)
Total Protein: 6.5 g/dL (ref 6.0–8.3)

## 2020-09-02 LAB — VITAMIN D 25 HYDROXY (VIT D DEFICIENCY, FRACTURES): VITD: 30.07 ng/mL (ref 30.00–100.00)

## 2020-09-02 LAB — TSH: TSH: 0.74 u[IU]/mL (ref 0.35–4.50)

## 2020-09-02 NOTE — Progress Notes (Signed)
Established Patient Office Visit  Subjective:  Patient ID: Karla Turner, female    DOB: [birth year]  Age: 69 y.o. MRN: [id number]  CC:  Chief Complaint  Patient presents with  . Transitions Of Care    med refills    HPI Karla Turner presents for transfer of care. She last saw Karla Turner in Oct 2020.  Karla Turner comes in today without any specific complaints.    She has recently been under stress just with the enormity of Covid.  She tested PHQ-9: 6  and GAD-7:6 scores today.  Patient reports she is not unusually depressed or anxious just mildly and believes she is handling it well.  She is working from home and in the office.  Her husband is well.  She reports she needs to exercise more.   Recent bone density showed that she has moved from osteopenia to osteoporosis in the lumbar spine. She declines Fosamax or any medication therapy at this time. She does not get regular exercise- but plans to start.   Her GERD is well controlled on Nexium daily. EGD showed LA grade A reflux esophagitis, non bleeding erosive gastropathy, normal duodenum.   She did see pulmonary recently for her history of bronchitis/bronchiectasis, asthma/asthma.  She has had flares triggered by nasal allergies.  She is improved with steroids and past  antibiotic use.  Her asthma is well controlled in general on Breo.  She has albuterol inhaler to use as needed.  She has as needed nebulizer use.  She currently comes in without any cough or pulmonary concerns.  She is followed regularly by her eye doctor for glaucoma.  Reports this is stable.    Hypothyroidism due to Hashimoto's thyroiditis: Patient with history of hypothyroidism for over 25 years.  Her most recent TSH has been low 0.138.  She is taking levothyroxine the Tirosint brand at 100 mcg daily.  BMI 35/Obesity: Dr. Laurence Turner is following for medical weight loss management.  She has been taking Metformin 500 mg twice daily with a meal.  She has noted no help with weight  loss.  Patient is due for repeat labs with A1c.   Wt Readings from Last 3 Encounters:  09/02/20 207 lb (93.9 kg)  08/17/20 207 lb 3.2 oz (94 kg)  12/30/19 204 lb (92.5 kg)     Patient presents today for complete physical.  Immunizations:She had Covid booster 10//18/21. Needs the Shingrix  Diet: Healthy Exercise: Not getting much- does not like to walk. Maybe interested in yoga- friend does it.  Colonoscopy: 01/29/2018: a few polyps and diverticulosis EGD: 01/30/2020:  EGD showed LA grade A reflux esophagitis, non bleeding erosive gastropathy, normal duodenum.  Dexa: UTD- 07/03/20 osteoporosis - declines fosamax or RX treatment OK with ca and Vit D and weight  Bearing exercise.  Pap Smear: followed by GYN PAP 05/20/20 Mammogram: 02/12/20 at [facility]- benign repeat in one year.  Dentist:UTD- goes x3 per year Vision: UTD- glaucoma  Past Medical History:  Diagnosis Date  . Asthma   . Bronchitis   . Cataracts, bilateral   . Colon polyps   . Depression   . Diverticulitis   . GERD (gastroesophageal reflux disease)   . Glaucoma   . Hyperlipidemia   . Hypothyroidism   . Lumbar herniated disc     Past Surgical History:  Procedure Laterality Date  . BIOPSY BREAST  2019  . cataract surgery Bilateral   . COLONOSCOPY    . COLONOSCOPY WITH PROPOFOL N/A 01/29/2018  Procedure: COLONOSCOPY WITH PROPOFOL;  Surgeon: Manya Silvas, MD;  Location: [facility] ENDOSCOPY;  Service: Endoscopy;  Laterality: N/A;  . DILATION AND CURETTAGE OF UTERUS    . ESOPHAGOGASTRODUODENOSCOPY (EGD) WITH PROPOFOL N/A 01/29/2018   Procedure: ESOPHAGOGASTRODUODENOSCOPY (EGD) WITH PROPOFOL;  Surgeon: Manya Silvas, MD;  Location: [facility] ENDOSCOPY;  Service: Endoscopy;  Laterality: N/A;  . mamoplasty Bilateral     Family History  Problem Relation Age of Onset  . Breast cancer Sister   . Fibromyalgia Sister   . Allergies Son   . Asthma Son        as a child     Social History   Socioeconomic History  . Marital  status: Married    Spouse name: Not on file  . Number of children: Not on file  . Years of education: Not on file  . Highest education level: Not on file  Occupational History  . Not on file  Tobacco Use  . Smoking status: Never Smoker  . Smokeless tobacco: Never Used  Vaping Use  . Vaping Use: Never used  Substance and Sexual Activity  . Alcohol use: Yes    Comment: occasionally  . Drug use: No  . Sexual activity: Yes  Other Topics Concern  . Not on file  Social History Narrative   Lives at home with family   Social Determinants of Health   Financial Resource Strain:   . Difficulty of Paying Living Expenses: Not on file  Food Insecurity:   . Worried About Charity fundraiser in the Last Year: Not on file  . Ran Out of Food in the Last Year: Not on file  Transportation Needs:   . Lack of Transportation (Medical): Not on file  . Lack of Transportation (Non-Medical): Not on file  Physical Activity:   . Days of Exercise per Week: Not on file  . Minutes of Exercise per Session: Not on file  Stress:   . Feeling of Stress : Not on file  Social Connections:   . Frequency of Communication with Friends and Family: Not on file  . Frequency of Social Gatherings with Friends and Family: Not on file  . Attends Religious Services: Not on file  . Active Member of Clubs or Organizations: Not on file  . Attends Archivist Meetings: Not on file  . Marital Status: Not on file  Intimate Partner Violence:   . Fear of Current or Ex-Partner: Not on file  . Emotionally Abused: Not on file  . Physically Abused: Not on file  . Sexually Abused: Not on file    Outpatient Medications Prior to Visit  Medication Sig Dispense Refill  . albuterol (VENTOLIN HFA) 108 (90 Base) MCG/ACT inhaler Inhale 2 puffs into the lungs every 6 (six) hours as needed for wheezing or shortness of breath. 1 each 11  . BREO ELLIPTA 100-25 MCG/INH AEPB INHALE ONE DOSE BY MOUTH DAILY 180 each 1  .  brinzolamide (AZOPT) 1 % ophthalmic suspension Place 1 drop into the right eye 3 (three) times daily.    . budesonide (PULMICORT) 0.25 MG/2ML nebulizer solution TAKE 1 VIAL BY NEBULIZATION TWO TIMES A DAY AS NEEDED 60 mL 4  . Levothyroxine Sodium (TIROSINT) 100 MCG CAPS TAKE ONE CAPSULE BY MOUTH EVERY MORNING ON AN EMPTY STOMACH 90 capsule 10  . metFORMIN (GLUCOPHAGE) 500 MG tablet Take 1 tablet by mouth 2 (two) times daily with a meal.    . methazolamide (NEPTAZANE) 25 MG tablet Take 25  mg by mouth 3 (three) times daily.    . montelukast (SINGULAIR) 10 MG tablet TAKE ONE TABLET BY MOUTH AT BEDTIME 90 tablet 2  . omeprazole (PRILOSEC) 20 MG capsule Take 1 capsule (20 mg total) by mouth daily. 90 capsule 1  . albuterol (PROVENTIL) (2.5 MG/3ML) 0.083% nebulizer solution Take 3 mLs (2.5 mg total) by nebulization every 6 (six) hours as needed for wheezing or shortness of breath. (Patient not taking: Reported on 09/02/2020) 150 mL 1   No facility-administered medications prior to visit.    Allergies  Allergen Reactions  . White Petrolatum     Other reaction(s): Other (See Comments) Pt states that she used Invisalign (which she states is a petroleum product and afterward developed systemic "poisoning."    Review of Systems Pertinent positives noted in history of present illness and otherwise negative.   Objective:    Physical Exam Vitals reviewed.  Constitutional:      Appearance: Normal appearance.  HENT:     Head: Normocephalic and atraumatic.  Eyes:     Conjunctiva/sclera: Conjunctivae normal.     Pupils: Pupils are equal, round, and reactive to light.  Cardiovascular:     Rate and Rhythm: Normal rate and regular rhythm.     Pulses: Normal pulses.     Heart sounds: Normal heart sounds.  Pulmonary:     Effort: Pulmonary effort is normal.     Breath sounds: Normal breath sounds.  Abdominal:     Palpations: Abdomen is soft.     Tenderness: There is no abdominal tenderness.    Musculoskeletal:        General: Normal range of motion.     Cervical back: Normal range of motion and neck supple.  Skin:    General: Skin is warm and dry.  Neurological:     General: No focal deficit present.     Mental Status: She is alert and oriented to person, place, and time.  Psychiatric:        Mood and Affect: Mood normal.        Behavior: Behavior normal.     BP 122/78 (BP Location: Left Arm, Patient Position: Sitting, Cuff Size: Normal)   Pulse 64   Temp (!) 97.4 F (36.3 C) (Oral)   Ht 5\' 4"  (1.626 m)   Wt 207 lb (93.9 kg)   SpO2 98%   BMI 35.53 kg/m  Wt Readings from Last 3 Encounters:  09/02/20 207 lb (93.9 kg)  08/17/20 207 lb 3.2 oz (94 kg)  12/30/19 204 lb (92.5 kg)   Pulse Readings from Last 3 Encounters:  09/02/20 64  08/17/20 86  12/30/19 71    BP Readings from Last 3 Encounters:  09/02/20 122/78  08/17/20 120/70  12/30/19 126/78    Lab Results  Component Value Date   CHOL 234 (H) 09/02/2020   HDL 65.60 09/02/2020   LDLCALC 135 (H) 09/02/2020   TRIG 164.0 (H) 09/02/2020   CHOLHDL 4 09/02/2020      There are no preventive care reminders to display for this patient.  There are no preventive care reminders to display for this patient.  Lab Results  Component Value Date   TSH 0.74 09/02/2020   Lab Results  Component Value Date   WBC 5.5 09/02/2020   HGB 14.0 09/02/2020   HCT 42.5 09/02/2020   MCV 90.9 09/02/2020   PLT 215.0 09/02/2020   Lab Results  Component Value Date   NA 141 09/02/2020   K 4.0  09/02/2020   CO2 29 09/02/2020   GLUCOSE 94 09/02/2020   BUN 13 09/02/2020   CREATININE 0.74 09/02/2020   BILITOT 0.6 09/02/2020   ALKPHOS 68 09/02/2020   AST 17 09/02/2020   ALT 13 09/02/2020   PROT 6.5 09/02/2020   ALBUMIN 4.3 09/02/2020   CALCIUM 9.2 09/02/2020   ANIONGAP 10 07/20/2017   GFR 83.49 09/02/2020   Lab Results  Component Value Date   CHOL 234 (H) 09/02/2020   Lab Results  Component Value Date   HDL  65.60 09/02/2020   Lab Results  Component Value Date   LDLCALC 135 (H) 09/02/2020   Lab Results  Component Value Date   TRIG 164.0 (H) 09/02/2020   Lab Results  Component Value Date   CHOLHDL 4 09/02/2020   Lab Results  Component Value Date   HGBA1C 5.5 09/02/2020      Assessment & Plan:   Problem List Items Addressed This Visit      Respiratory   Bronchiectasis without complication ([place])     Her asthma is well controlled in general on Breo.  She has albuterol inhaler to use as needed.  She has as needed nebulizer use.  She currently comes in without any cough or pulmonary concerns.         Digestive   GERD (gastroesophageal reflux disease)     Continue on Nexium daily. Wt loss recommended.       Relevant Orders   VITAMIN D 25 Hydroxy (Vit-D Deficiency, Fractures) (Completed)   B12 and Folate Panel (Completed)     Endocrine   Hypothyroidism due to Hashimoto's thyroiditis    Check TSH on levothyroxine the Tirosint brand at 100 mcg daily        Musculoskeletal and Integument   Localized osteoporosis without current pathological fracture    Bone density:  07/03/20 osteoporosis - Declines fosamax or medication . Continue with diet rich  in calcium ca and Vit D and weight bearing exercise.         Other   Hx of adenomatous colonic polyps     01/29/2018: Colonoscopy  for personal history of colon polyps notable for 2 polyps diverticulosis and internal hemorrhoids. Pathology returned one hyperplastic and one tubular adenoma. She is due for repeat colonoscopy in 2024.       Obesity (BMI 35.0-39.9 without comorbidity) - Primary    Patient is due for repeat labs with A1c.        Relevant Orders   CBC with Differential/Platelet (Completed)   TSH (Completed)   Comprehensive metabolic panel (Completed)   Hemoglobin A1c (Completed)   Lipid panel (Completed)   Preventative health care    Other Visit Diagnoses    Need for shingles vaccine       Relevant Orders    Varicella-zoster vaccine IM (Shingrix) (Completed)    Please go to the lab today.   Continue to follow up with your Pulmonary doctor as needed.   Shingrix vaccine series started today. Please make nurse visit to get the second vaccine in 2-6 months.   Continue to work on your healthy diet and exercise.  You are speaking with your diet specialist today about medication management.    Continue with your regular dental and eye vision exams.  Please use sunscreen when outdoors SPF 30.  No orders of the defined types were placed in this encounter.   Follow-up: Return in about 1 year (around 09/02/2021).  This visit occurred during the SARS-CoV-2 public health  emergency.  Safety protocols were in place, including screening questions prior to the visit, additional usage of staff PPE, and extensive cleaning of exam room while observing appropriate contact time as indicated for disinfecting solutions.    Denice Paradise, NP

## 2020-09-02 NOTE — Patient Instructions (Addendum)
Please go to the lab today.   Continue to follow up with your Pulmonary doctor as needed.   Shingrix vaccine series started today. Please make nurse visit to get the second vaccine in 2-6 months.   Continue to work on your healthy diet and exercise.  You are speaking with your diet specialist today about medication management.    Bone density:  07/03/20 osteoporosis - Declines fosamax or medication . Continue with diet rich  in calcium ca and Vit D and weight bearing exercise.   Continue with your regular dental and eye vision exams.  Please use sunscreen when outdoors SPF 30.    Preventive Care 67 Years and Older, Female Preventive care refers to lifestyle choices and visits with your health care provider that can promote health and wellness. This includes:  A yearly physical exam. This is also called an annual well check.  Regular dental and eye exams.  Immunizations.  Screening for certain conditions.  Healthy lifestyle choices, such as diet and exercise. What can I expect for my preventive care visit? Physical exam Your health care provider will check:  Height and weight. These may be used to calculate body mass index (BMI), which is a measurement that tells if you are at a healthy weight.  Heart rate and blood pressure.  Your skin for abnormal spots. Counseling Your health care provider may ask you questions about:  Alcohol, tobacco, and drug use.  Emotional well-being.  Home and relationship well-being.  Sexual activity.  Eating habits.  History of falls.  Memory and ability to understand (cognition).  Work and work Statistician.  Pregnancy and menstrual history. What immunizations do I need?  Influenza (flu) vaccine  This is recommended every year. Tetanus, diphtheria, and pertussis (Tdap) vaccine  You may need a Td booster every 10 years. Varicella (chickenpox) vaccine  You may need this vaccine if you have not already been vaccinated. Zoster  (shingles) vaccine  You may need this after age 67. Pneumococcal conjugate (PCV13) vaccine  One dose is recommended after age 67. Pneumococcal polysaccharide (PPSV23) vaccine  One dose is recommended after age 67. Measles, mumps, and rubella (MMR) vaccine  You may need at least one dose of MMR if you were born in 1957 or later. You may also need a second dose. Meningococcal conjugate (MenACWY) vaccine  You may need this if you have certain conditions. Hepatitis A vaccine  You may need this if you have certain conditions or if you travel or work in places where you may be exposed to hepatitis A. Hepatitis B vaccine  You may need this if you have certain conditions or if you travel or work in places where you may be exposed to hepatitis B. Haemophilus influenzae type b (Hib) vaccine  You may need this if you have certain conditions. You may receive vaccines as individual doses or as more than one vaccine together in one shot (combination vaccines). Talk with your health care provider about the risks and benefits of combination vaccines. What tests do I need? Blood tests  Lipid and cholesterol levels. These may be checked every 5 years, or more frequently depending on your overall health.  Hepatitis C test.  Hepatitis B test. Screening  Lung cancer screening. You may have this screening every year starting at age 67 if you have a 30-pack-year history of smoking and currently smoke or have quit within the past 15 years.  Colorectal cancer screening. All adults should have this screening starting at age 67  and continuing until age 31. Your health care provider may recommend screening at age 39 if you are at increased risk. You will have tests every 1-10 years, depending on your results and the type of screening test.  Diabetes screening. This is done by checking your blood sugar (glucose) after you have not eaten for a while (fasting). You may have this done every 1-3  years.  Mammogram. This may be done every 1-2 years. Talk with your health care provider about how often you should have regular mammograms.  BRCA-related cancer screening. This may be done if you have a family history of breast, ovarian, tubal, or peritoneal cancers. Other tests  Sexually transmitted disease (STD) testing.  Bone density scan. This is done to screen for osteoporosis. You may have this done starting at age 60. Follow these instructions at home: Eating and drinking  Eat a diet that includes fresh fruits and vegetables, whole grains, lean protein, and low-fat dairy products. Limit your intake of foods with high amounts of sugar, saturated fats, and salt.  Take vitamin and mineral supplements as recommended by your health care provider.  Do not drink alcohol if your health care provider tells you not to drink.  If you drink alcohol: ? Limit how much you have to 0-1 drink a day. ? Be aware of how much alcohol is in your drink. In the [place] one drink equals one 12 oz bottle of beer (355 mL), one 5 oz glass of wine (148 mL), or one 1 oz glass of hard liquor (44 mL). Lifestyle  Take daily care of your teeth and gums.  Stay active. Exercise for at least 30 minutes on 5 or more days each week.  Do not use any products that contain nicotine or tobacco, such as cigarettes, e-cigarettes, and chewing tobacco. If you need help quitting, ask your health care provider.  If you are sexually active, practice safe sex. Use a condom or other form of protection in order to prevent STIs (sexually transmitted infections).  Talk with your health care provider about taking a low-dose aspirin or statin. What's next?  Go to your health care provider once a year for a well check visit.  Ask your health care provider how often you should have your eyes and teeth checked.  Stay up to date on all vaccines. This information is not intended to replace advice given to you by your health care  provider. Make sure you discuss any questions you have with your health care provider. Document Revised: 10/18/2018 Document Reviewed: 10/18/2018 Elsevier Patient Education  [place].  Osteoporosis  Osteoporosis is thinning and loss of density in your bones. Osteoporosis makes bones more brittle and fragile and more likely to break (fracture). Over time, osteoporosis can cause your bones to become so weak that they fracture after a minor fall. Bones in the hip, wrist, and spine are most likely to fracture due to osteoporosis. What are the causes? The exact cause of this condition is not known. What increases the risk? You may be at greater risk for osteoporosis if you:  Have a family history of the condition.  Have poor nutrition.  Use steroid medicines, such as prednisone.  Are female.  Are age 38 or older.  Smoke or have a history of smoking.  Are not physically active (are sedentary).  Are white (Caucasian) or of Asian descent.  Have a small body frame.  Take certain medicines, such as antiseizure medicines. What are the signs or  symptoms? A fracture might be the first sign of osteoporosis, especially if the fracture results from a fall or injury that usually would not cause a bone to break. Other signs and symptoms include:  Pain in the neck or low back.  Stooped posture.  Loss of height. How is this diagnosed? This condition may be diagnosed based on:  Your medical history.  A physical exam.  A bone mineral density test, also called a DXA or DEXA test (dual-energy X-ray absorptiometry test). This test uses X-rays to measure the amount of minerals in your bones. How is this treated? The goal of treatment is to strengthen your bones and lower your risk for a fracture. Treatment may involve:  Making lifestyle changes, such as: ? Including foods with more calcium and vitamin D in your diet. ? Doing weight-bearing and muscle-strengthening  exercises. ? Stopping tobacco use. ? Limiting alcohol intake.  Taking medicine to slow the process of bone loss or to increase bone density.  Taking daily supplements of calcium and vitamin D.  Taking hormone replacement medicines, such as estrogen for women and testosterone for men.  Monitoring your levels of calcium and vitamin D. Follow these instructions at home:  Activity  Exercise as told by your health care provider. Ask your health care provider what exercises and activities are safe for you. You should do: ? Exercises that make you work against gravity (weight-bearing exercises), such as tai chi, yoga, or walking. ? Exercises to strengthen muscles, such as lifting weights. Lifestyle  Limit alcohol intake to no more than 1 drink a day for nonpregnant women and 2 drinks a day for men. One drink equals 12 oz of beer, 5 oz of wine, or 1 oz of hard liquor.  Do not use any products that contain nicotine or tobacco, such as cigarettes and e-cigarettes. If you need help quitting, ask your health care provider. Preventing falls  Use devices to help you move around (mobility aids) as needed, such as canes, walkers, scooters, or crutches.  Keep rooms well-lit and clutter-free.  Remove tripping hazards from walkways, including cords and throw rugs.  Install grab bars in bathrooms and safety rails on stairs.  Use rubber mats in the bathroom and other areas that are often wet or slippery.  Wear closed-toe shoes that fit well and support your feet. Wear shoes that have rubber soles or low heels.  Review your medicines with your health care provider. Some medicines can cause dizziness or changes in blood pressure, which can increase your risk of falling. General instructions  Include calcium and vitamin D in your diet. Calcium is important for bone health, and vitamin D helps your body to absorb calcium. Good sources of calcium and vitamin D include: ? Certain fatty fish, such as  salmon and tuna. ? Products that have calcium and vitamin D added to them (fortified products), such as fortified cereals. ? Egg yolks. ? Cheese. ? Liver.  Take over-the-counter and prescription medicines only as told by your health care provider.  Keep all follow-up visits as told by your health care provider. This is important. Contact a health care provider if:  You have never been screened for osteoporosis and you are: ? A woman who is age 72 or older. ? A man who is age 72 or older. Get help right away if:  You fall or injure yourself. Summary  Osteoporosis is thinning and loss of density in your bones. This makes bones more brittle and fragile and more  likely to break (fracture),even with minor falls.  The goal of treatment is to strengthen your bones and reduce your risk for a fracture.  Include calcium and vitamin D in your diet. Calcium is important for bone health, and vitamin D helps your body to absorb calcium.  Talk with your health care provider about screening for osteoporosis if you are a woman who is age 61 or older, or a man who is age 50 or older. This information is not intended to replace advice given to you by your health care provider. Make sure you discuss any questions you have with your health care provider. Document Revised: 10/06/2017 Document Reviewed: 08/18/2017 Elsevier Patient Education  2020 [employer].

## 2020-09-03 NOTE — Assessment & Plan Note (Signed)
Her asthma is well controlled in general on Breo.  She has albuterol inhaler to use as needed.  She has as needed nebulizer use.  She currently comes in without any cough or pulmonary concerns.

## 2020-09-03 NOTE — Assessment & Plan Note (Signed)
Check TSH on levothyroxine the Tirosint brand at 100 mcg daily

## 2020-09-03 NOTE — Assessment & Plan Note (Signed)
Continue on Nexium daily. Wt loss recommended.

## 2020-09-03 NOTE — Assessment & Plan Note (Addendum)
01/29/2018: Colonoscopy  for personal history of colon polyps notable for 2 polyps diverticulosis and internal hemorrhoids. Pathology returned one hyperplastic and one tubular adenoma. She is due for repeat colonoscopy in 2024.

## 2020-09-03 NOTE — Assessment & Plan Note (Signed)
Patient is due for repeat labs with A1c.

## 2020-09-03 NOTE — Assessment & Plan Note (Signed)
Bone density:  07/03/20 osteoporosis - Declines fosamax or medication . Continue with diet rich  in calcium ca and Vit D and weight bearing exercise.

## 2020-09-24 ENCOUNTER — Telehealth: Payer: Self-pay | Admitting: Pulmonary Disease

## 2020-09-24 DIAGNOSIS — J4531 Mild persistent asthma with (acute) exacerbation: Secondary | ICD-10-CM

## 2020-09-24 MED ORDER — BREO ELLIPTA 100-25 MCG/INH IN AEPB: INHALATION_SPRAY | RESPIRATORY_TRACT | 3 refills | Status: DC

## 2020-09-24 NOTE — Telephone Encounter (Signed)
Called and spoke with patient to confirm medication and pharmacy for refill. She confirmed both. RX sent in. Nothing further needed at this time.

## 2020-10-18 ENCOUNTER — Other Ambulatory Visit: Payer: Self-pay | Admitting: Primary Care

## 2020-10-18 DIAGNOSIS — J4531 Mild persistent asthma with (acute) exacerbation: Secondary | ICD-10-CM

## 2020-10-21 ENCOUNTER — Telehealth: Payer: Self-pay | Admitting: Nurse Practitioner

## 2020-10-21 MED ORDER — LEVOTHYROXINE SODIUM 100 MCG PO CAPS: ORAL_CAPSULE | ORAL | 10 refills | Status: DC

## 2020-10-21 NOTE — Addendum Note (Signed)
Addended by: Elpidio Galea T on: 10/21/2020 09:14 AM   Modules accepted: Orders

## 2020-10-21 NOTE — Telephone Encounter (Signed)
Pt Needs a refill on Levothyroxine Sodium (TIROSINT) 100 MCG CAPS sent to [facility]

## 2020-12-07 ENCOUNTER — Ambulatory Visit
Admission: RE | Admit: 2020-12-07 | Discharge: 2020-12-07 | Disposition: A | Discharge status: Home / Self Care | Payer: No Typology Code available for payment source | Source: Ambulatory Visit | Attending: Adult Health | Admitting: Adult Health

## 2020-12-07 ENCOUNTER — Ambulatory Visit (INDEPENDENT_AMBULATORY_CARE_PROVIDER_SITE_OTHER): Payer: No Typology Code available for payment source | Admitting: Adult Health

## 2020-12-07 ENCOUNTER — Other Ambulatory Visit: Payer: Self-pay

## 2020-12-07 ENCOUNTER — Encounter: Payer: Self-pay | Admitting: Adult Health

## 2020-12-07 VITALS — BP 130/62 | HR 63 | Temp 98.0°F | Resp 16 | Ht 64.0 in | Wt 212.0 lb

## 2020-12-07 DIAGNOSIS — M545 Low back pain, unspecified: Secondary | ICD-10-CM

## 2020-12-07 DIAGNOSIS — M816 Localized osteoporosis [Lequesne]: Secondary | ICD-10-CM | POA: Diagnosis not present

## 2020-12-07 DIAGNOSIS — Z23 Encounter for immunization: Secondary | ICD-10-CM

## 2020-12-07 DIAGNOSIS — Z6836 Body mass index (BMI) 36.0-36.9, adult: Secondary | ICD-10-CM | POA: Diagnosis not present

## 2020-12-07 DIAGNOSIS — E669 Obesity, unspecified: Secondary | ICD-10-CM

## 2020-12-07 NOTE — Progress Notes (Signed)
New patient visit   Patient: Karla Turner   DOB: 10/17/1951   70 y.o. Female  MRN: [id number] Visit Date: 12/07/2020  Today's healthcare provider: Marcille Buffy, FNP   Chief Complaint  Patient presents with  . New Patient (Initial Visit)   Subjective    Karla Turner is a 70 y.o. female who presents today as a new patient to establish care.  HPI  Patient was seeing Philis Nettle NP, prior then she left and she had to see  [facility] doctor and she went back to [employer] NP at [employer]. She has a history of Chronic bronchitis  Sees [facility]- sees [place] [facility].   She is seeing endocrinology in the this week  TSH 0.74 - 3 month ago- was previously on synthroid 100 mcg- currently not on active medication list. .   History of cholesterol elevated.   She lost her her dog suddenly and  Has been sad regarding this but is coping well.   Has chronic lower back pain that is intermittent, she carried some heavy things in her house. No known injury.   Needs second Zoster last one was 09/01/20. No complications from it.   Patient  denies any fever,chills, rash, chest pain, shortness of breath, nausea, vomiting, or diarrhea.  Denies dizziness, lightheadedness, pre syncopal or syncopal episodes.    Past Medical History:  Diagnosis Date  . Arthritis   . Asthma   . Bronchitis   . Cataracts, bilateral   . Colon polyps   . Depression   . Diverticulitis   . GERD (gastroesophageal reflux disease)   . Glaucoma   . Hyperlipidemia   . Hypothyroidism   . Lumbar herniated disc   . Osteoporosis    Past Surgical History:  Procedure Laterality Date  . BIOPSY BREAST  2019  . BREAST SURGERY    . cataract surgery Bilateral   . COLONOSCOPY    . COLONOSCOPY WITH PROPOFOL N/A 01/29/2018   Procedure: COLONOSCOPY WITH PROPOFOL;  Surgeon: Manya Silvas, MD;  Location: [facility] ENDOSCOPY;  Service: Endoscopy;  Laterality: N/A;  . DILATION AND CURETTAGE OF UTERUS     . ESOPHAGOGASTRODUODENOSCOPY (EGD) WITH PROPOFOL N/A 01/29/2018   Procedure: ESOPHAGOGASTRODUODENOSCOPY (EGD) WITH PROPOFOL;  Surgeon: Manya Silvas, MD;  Location: [facility] ENDOSCOPY;  Service: Endoscopy;  Laterality: N/A;  . EYE SURGERY    . mamoplasty Bilateral    Family Status  Relation Name Status  . Mother  Deceased  . Sister  Alive  . Father  Deceased  . Brother  Deceased       age 63, car accident   . Son  Alive  . MGM  (Not Specified)  . MGF  (Not Specified)   Family History  Problem Relation Age of Onset  . Breast cancer Sister   . Fibromyalgia Sister   . Allergies Son   . Asthma Son        as a child   . Cancer Maternal Grandmother   . Heart attack Maternal Grandfather    Social History   Socioeconomic History  . Marital status: Married    Spouse name: Not on file  . Number of children: Not on file  . Years of education: Not on file  . Highest education level: Not on file  Occupational History  . Not on file  Tobacco Use  . Smoking status: Never Smoker  . Smokeless tobacco: Never Used  Vaping Use  . Vaping Use:  Never used  Substance and Sexual Activity  . Alcohol use: Yes    Comment: occasionally  . Drug use: No  . Sexual activity: Yes  Other Topics Concern  . Not on file  Social History Narrative   Lives at home with family   Social Determinants of Health   Financial Resource Strain: Not on file  Food Insecurity: Not on file  Transportation Needs: Not on file  Physical Activity: Not on file  Stress: Not on file  Social Connections: Not on file   Outpatient Medications Prior to Visit  Medication Sig  . albuterol (VENTOLIN HFA) 108 (90 Base) MCG/ACT inhaler Inhale 2 puffs into the lungs every 6 (six) hours as needed for wheezing or shortness of breath.  Marland Kitchen BREO ELLIPTA 100-25 MCG/INH AEPB INHALE ONE DOSE BY MOUTH DAILY  . brinzolamide (AZOPT) 1 % ophthalmic suspension Place 1 drop into the right eye 3 (three) times daily.  . budesonide  (PULMICORT) 0.25 MG/2ML nebulizer solution TAKE 1 VIAL BY NEBULIZATION TWO TIMES A DAY AS NEEDED  . methazolamide (NEPTAZANE) 25 MG tablet Take 25 mg by mouth 3 (three) times daily.  . montelukast (SINGULAIR) 10 MG tablet TAKE ONE TABLET BY MOUTH AT BEDTIME  . omeprazole (PRILOSEC) 20 MG capsule Take 1 capsule (20 mg total) by mouth daily.  . Semaglutide,0.25 or 0.5MG /DOS, 2 MG/1.5ML SOPN Inject into the skin.  . [DISCONTINUED] Levothyroxine Sodium (TIROSINT) 100 MCG CAPS TAKE ONE CAPSULE BY MOUTH EVERY MORNING ON AN EMPTY STOMACH  . [DISCONTINUED] metFORMIN (GLUCOPHAGE) 500 MG tablet Take 1 tablet by mouth 2 (two) times daily with a meal.   No facility-administered medications prior to visit.   Allergies  Allergen Reactions  . White Petrolatum     Other reaction(s): Other (See Comments) Pt states that she used Invisalign (which she states is a petroleum product and afterward developed systemic "poisoning."    Immunization History  Administered Date(s) Administered  . Fluad Quad(high Dose 65+) 08/27/2019, 08/17/2020  . Influenza, High Dose Seasonal PF 09/07/2018  . Influenza-Unspecified 09/05/2018, 08/17/2020  . PFIZER(Purple Top)SARS-COV-2 Vaccination 12/15/2019, 01/08/2020, 08/24/2020  . Pneumococcal Conjugate-13 09/07/2018  . Pneumococcal Polysaccharide-23 01/20/2020  . Pneumococcal-Unspecified 09/09/2014  . Tdap 06/09/2020  . Zoster Recombinat (Shingrix) 09/02/2020    Health Maintenance  Topic Date Due  . MAMMOGRAM  02/11/2021  . COVID-19 Vaccine (4 - Booster for Pfizer series) 02/22/2021  . COLONOSCOPY (Pts 45-87yrs Insurance coverage will need to be confirmed)  01/30/2028  . TETANUS/TDAP  06/09/2030  . INFLUENZA VACCINE  Completed  . DEXA SCAN  Completed  . Hepatitis C Screening  Completed  . PNA vac Low Risk Adult  Completed    Patient Care Team: Graci Hulce, Kelby Aline, FNP as PCP - General (Family Medicine)  Review of Systems  Constitutional: Positive for fatigue.   HENT: Positive for congestion and hearing loss.   Eyes: Positive for photophobia.  Respiratory: Positive for shortness of breath.   Cardiovascular: Negative.   Gastrointestinal: Negative.   Genitourinary: Negative.   Musculoskeletal: Positive for back pain.  Neurological: Negative.   Psychiatric/Behavioral: Negative.     Last CBC Lab Results  Component Value Date   WBC 5.5 09/02/2020   HGB 14.0 09/02/2020   HCT 42.5 09/02/2020   MCV 90.9 09/02/2020   MCH 30.1 08/24/2018   RDW 14.0 09/02/2020   PLT 215.0 59/56/3875   Last metabolic panel Lab Results  Component Value Date   GLUCOSE 94 09/02/2020   NA 141 09/02/2020  K 4.0 09/02/2020   CL 104 09/02/2020   CO2 29 09/02/2020   BUN 13 09/02/2020   CREATININE 0.74 09/02/2020   GFRNONAA 75 08/24/2018   GFRAA 87 08/24/2018   CALCIUM 9.2 09/02/2020   PROT 6.5 09/02/2020   ALBUMIN 4.3 09/02/2020   LABGLOB 2.4 08/24/2018   AGRATIO 1.8 08/24/2018   BILITOT 0.6 09/02/2020   ALKPHOS 68 09/02/2020   AST 17 09/02/2020   ALT 13 09/02/2020   ANIONGAP 10 07/20/2017   Last lipids Lab Results  Component Value Date   CHOL 234 (H) 09/02/2020   HDL 65.60 09/02/2020   LDLCALC 135 (H) 09/02/2020   TRIG 164.0 (H) 09/02/2020   CHOLHDL 4 09/02/2020   Last hemoglobin A1c Lab Results  Component Value Date   HGBA1C 5.5 09/02/2020   Last thyroid functions Lab Results  Component Value Date   TSH 0.74 09/02/2020   Last vitamin D Lab Results  Component Value Date   VD25OH 30.07 09/02/2020   Last vitamin B12 and Folate Lab Results  Component Value Date   VITAMINB12 308 09/02/2020   FOLATE 10.6 09/02/2020      Objective    BP 130/62   Pulse 63   Temp 98 F (36.7 C) (Oral)   Resp 16   Ht 5\' 4"  (1.626 m)   Wt 212 lb (96.2 kg)   SpO2 100%   BMI 36.39 kg/m  Physical Exam Vitals reviewed.  Constitutional:      General: She is not in acute distress.    Appearance: She is well-developed. She is not diaphoretic.      Interventions: She is not intubated. HENT:     Head: Normocephalic and atraumatic.     Right Ear: External ear normal.     Left Ear: External ear normal.     Nose: Nose normal.     Mouth/Throat:     Pharynx: No oropharyngeal exudate.  Eyes:     General: Lids are normal. No scleral icterus.       Right eye: No discharge.        Left eye: No discharge.     Conjunctiva/sclera: Conjunctivae normal.     Right eye: Right conjunctiva is not injected. No exudate or hemorrhage.    Left eye: Left conjunctiva is not injected. No exudate or hemorrhage.    Pupils: Pupils are equal, round, and reactive to light.  Neck:     Thyroid: No thyroid mass or thyromegaly.     Vascular: Normal carotid pulses. No carotid bruit, hepatojugular reflux or JVD.     Trachea: Trachea and phonation normal. No tracheal tenderness or tracheal deviation.     Meningeal: Brudzinski's sign and Kernig's sign absent.  Cardiovascular:     Rate and Rhythm: Normal rate and regular rhythm.     Pulses: Normal pulses.          Radial pulses are 2+ on the right side and 2+ on the left side.       Dorsalis pedis pulses are 2+ on the right side and 2+ on the left side.       Posterior tibial pulses are 2+ on the right side and 2+ on the left side.     Heart sounds: Normal heart sounds, S1 normal and S2 normal. Heart sounds not distant. No murmur heard. No friction rub. No gallop.   Pulmonary:     Effort: Pulmonary effort is normal. No tachypnea, bradypnea, accessory muscle usage or respiratory distress. She is not  intubated.     Breath sounds: Normal breath sounds. No stridor. No wheezing or rales.  Chest:     Chest wall: No tenderness.  Breasts:     Right: No supraclavicular adenopathy.     Left: No supraclavicular adenopathy.    Abdominal:     General: Bowel sounds are normal. There is no distension or abdominal bruit.     Palpations: Abdomen is soft. There is no shifting dullness, fluid wave, hepatomegaly, splenomegaly,  mass or pulsatile mass.     Tenderness: There is no abdominal tenderness. There is no guarding or rebound.     Hernia: No hernia is present.  Musculoskeletal:        General: No tenderness or deformity. Normal range of motion.     Cervical back: Full passive range of motion without pain, normal range of motion and neck supple. No edema, erythema or rigidity. No spinous process tenderness or muscular tenderness. Normal range of motion.  Lymphadenopathy:     Head:     Right side of head: No submental, submandibular, tonsillar, preauricular, posterior auricular or occipital adenopathy.     Left side of head: No submental, submandibular, tonsillar, preauricular, posterior auricular or occipital adenopathy.     Cervical: No cervical adenopathy.     Right cervical: No superficial, deep or posterior cervical adenopathy.    Left cervical: No superficial, deep or posterior cervical adenopathy.     Upper Body:     Right upper body: No supraclavicular or pectoral adenopathy.     Left upper body: No supraclavicular or pectoral adenopathy.  Skin:    General: Skin is warm and dry.     Coloration: Skin is not pale.     Findings: No abrasion, bruising, burn, ecchymosis, erythema, lesion, petechiae or rash.     Nails: There is no clubbing.  Neurological:     Mental Status: She is alert and oriented to person, place, and time.     GCS: GCS eye subscore is 4. GCS verbal subscore is 5. GCS motor subscore is 6.     Cranial Nerves: No cranial nerve deficit.     Sensory: No sensory deficit.     Motor: No tremor, atrophy, abnormal muscle tone or seizure activity.     Coordination: Coordination normal.     Gait: Gait normal.     Deep Tendon Reflexes: Reflexes are normal and symmetric. Reflexes normal. Babinski sign absent on the right side. Babinski sign absent on the left side.     Reflex Scores:      Tricep reflexes are 2+ on the right side and 2+ on the left side.      Bicep reflexes are 2+ on the right  side and 2+ on the left side.      Brachioradialis reflexes are 2+ on the right side and 2+ on the left side.      Patellar reflexes are 2+ on the right side and 2+ on the left side.      Achilles reflexes are 2+ on the right side and 2+ on the left side. Psychiatric:        Mood and Affect: Mood normal.        Speech: Speech normal.        Behavior: Behavior normal.        Thought Content: Thought content normal.        Judgment: Judgment normal.     Depression Screen PHQ 2/9 Scores 12/07/2020 09/02/2020 04/18/2019 03/15/2019  PHQ -  2 Score 1 2 1  0  PHQ- 9 Score 4 6 1 1   Exception Documentation - - - -  Not completed - - - -   No results found for any visits on 12/07/20.  Assessment & Plan       Low back pain without sciatica, unspecified back pain laterality, unspecified chronicity - Plan: DG Lumbar Spine 2-3 Views  Need for shingles vaccine - Plan: Varicella-zoster vaccine IM  Body mass index (BMI) of 36.0-36.9 in adult  Localized osteoporosis without current pathological fracture  Obesity (BMI 35.0-39.9 without comorbidity)   Orders Placed This Encounter  Procedures  . DG Lumbar Spine 2-3 Views    Order Specific Question:   Reason for Exam (SYMPTOM  OR DIAGNOSIS REQUIRED)    Answer:   lower back pain chronic    Order Specific Question:   Preferred imaging location?    Answer:   [facility]  . Varicella-zoster vaccine IM   Advised can try a low dose muscle relaxer for lower back pain at bedtime. She declines at this time.   She has pulmonologist, and will be seeing endocrinology this week.  She has no other concerns at this visit.  Cholesterol was high, she is working on diet and exercise,and weight loss, She will work on diet. Recommend repeat labs and lipid panel in 6 months from her October 2021 labs.   I spent 45  dedicated to the care of this patient on the date of this encounter to include pre-visit review of records, face-to-face time with the patient  discussing The primary encounter diagnosis was Low back pain without sciatica, unspecified back pain laterality, unspecified chronicity. Diagnoses of Need for shingles vaccine, Body mass index (BMI) of 36.0-36.9 in adult, Localized osteoporosis without current pathological fracture, and Obesity (BMI 35.0-39.9 without comorbidity) were also pertinent to this visit.  and post visit ordering of testing.   Return in about 3 months (around 03/06/2021), or if symptoms worsen or fail to improve, for at any time for any worsening symptoms, Go to Emergency room/ urgent care if worse.     Red Flags discussed. The patient was given clear instructions to go to ER or return to medical center if any red flags develop, symptoms do not improve, worsen or new problems develop. They verbalized understanding.   The entirety of the information documented in the History of Present Illness, Review of Systems and Physical Exam were personally obtained by me. Portions of this information were initially documented by the CMA and reviewed by me for thoroughness and accuracy.      Marcille Buffy, [place] [phone] (phone) [phone] (fax)  [place]

## 2020-12-07 NOTE — Progress Notes (Signed)
Mild age related disc space narrowing in the lumbar region. Osteoarthritis.  No acute finding. Ordinary disc space narrowing and lower back osteoarthritis.

## 2020-12-07 NOTE — Patient Instructions (Addendum)
Mediterranean Diet A Mediterranean diet refers to food and lifestyle choices that are based on the traditions of countries located on the [place]. This way of eating has been shown to help prevent certain conditions and improve outcomes for people who have chronic diseases, like kidney disease and heart disease. What are tips for following this plan? Lifestyle  Cook and eat meals together with your family, when possible.  Drink enough fluid to keep your urine clear or pale yellow.  Be physically active every day. This includes: ? Aerobic exercise like running or swimming. ? Leisure activities like gardening, walking, or housework.  Get 7-8 hours of sleep each night.  If recommended by your health care provider, drink red wine in moderation. This means 1 glass a day for nonpregnant women and 2 glasses a day for men. A glass of wine equals 5 oz (150 mL). Reading food labels  Check the serving size of packaged foods. For foods such as rice and pasta, the serving size refers to the amount of cooked product, not dry.  Check the total fat in packaged foods. Avoid foods that have saturated fat or trans fats.  Check the ingredients list for added sugars, such as corn syrup.   Shopping  At the grocery store, buy most of your food from the areas near the walls of the store. This includes: ? Fresh fruits and vegetables (produce). ? Grains, beans, nuts, and seeds. Some of these may be available in unpackaged forms or large amounts (in bulk). ? Fresh seafood. ? Poultry and eggs. ? Low-fat dairy products.  Buy whole ingredients instead of prepackaged foods.  Buy fresh fruits and vegetables in-season from local farmers markets.  Buy frozen fruits and vegetables in resealable bags.  If you do not have access to quality fresh seafood, buy precooked frozen shrimp or canned fish, such as tuna, salmon, or sardines.  Buy small amounts of raw or cooked vegetables, salads, or olives from  the deli or salad bar at your store.  Stock your pantry so you always have certain foods on hand, such as olive oil, canned tuna, canned tomatoes, rice, pasta, and beans. Cooking  Cook foods with extra-virgin olive oil instead of using butter or other vegetable oils.  Have meat as a side dish, and have vegetables or grains as your main dish. This means having meat in small portions or adding small amounts of meat to foods like pasta or stew.  Use beans or vegetables instead of meat in common dishes like chili or lasagna.  Experiment with different cooking methods. Try roasting or broiling vegetables instead of steaming or sauteing them.  Add frozen vegetables to soups, stews, pasta, or rice.  Add nuts or seeds for added healthy fat at each meal. You can add these to yogurt, salads, or vegetable dishes.  Marinate fish or vegetables using olive oil, lemon juice, garlic, and fresh herbs. Meal planning  Plan to eat 1 vegetarian meal one day each week. Try to work up to 2 vegetarian meals, if possible.  Eat seafood 2 or more times a week.  Have healthy snacks readily available, such as: ? Vegetable sticks with hummus. ? Greek yogurt. ? Fruit and nut trail mix.  Eat balanced meals throughout the week. This includes: ? Fruit: 2-3 servings a day ? Vegetables: 4-5 servings a day ? Low-fat dairy: 2 servings a day ? Fish, poultry, or lean meat: 1 serving a day ? Beans and legumes: 2 or more servings a week ?   Nuts and seeds: 1-2 servings a day ? Whole grains: 6-8 servings a day ? Extra-virgin olive oil: 3-4 servings a day  Limit red meat and sweets to only a few servings a month   What are my food choices?  Mediterranean diet ? Recommended  Grains: Whole-grain pasta. Brown rice. Bulgar wheat. Polenta. Couscous. Whole-wheat bread. [place].  Vegetables: Artichokes. Beets. Broccoli. Cabbage. Carrots. Eggplant. Green beans. Chard. Kale. Spinach. Onions. Leeks. Peas. Squash.  Tomatoes. Peppers. Radishes.  Fruits: Apples. Apricots. Avocado. Berries. Bananas. Cherries. Dates. Figs. Grapes. Lemons. Melon. Oranges. Peaches. Plums. Pomegranate.  Meats and other protein foods: Beans. Almonds. Sunflower seeds. Pine nuts. Peanuts. [place]. Salmon. Scallops. Shrimp. [place]. Tilapia. Clams. Oysters. Eggs.  Dairy: Low-fat milk. Cheese. Greek yogurt.  Beverages: Water. Red wine. Herbal tea.  Fats and oils: Extra virgin olive oil. Avocado oil. Grape seed oil.  Sweets and desserts: [place] yogurt with honey. Baked apples. Poached pears. Trail mix.  Seasoning and other foods: Basil. Cilantro. Coriander. Cumin. Mint. Parsley. Sage. Rosemary. Tarragon. Garlic. Oregano. Thyme. Pepper. Balsalmic vinegar. Tahini. Hummus. Tomato sauce. Olives. Mushrooms. ? Limit these  Grains: Prepackaged pasta or rice dishes. Prepackaged cereal with added sugar.  Vegetables: Deep fried potatoes (french fries).  Fruits: Fruit canned in syrup.  Meats and other protein foods: Beef. Pork. Lamb. Poultry with skin. Hot dogs. Berniece Salines.  Dairy: Ice cream. Sour cream. Whole milk.  Beverages: Juice. Sugar-sweetened soft drinks. Beer. Liquor and spirits.  Fats and oils: Butter. Canola oil. Vegetable oil. Beef fat (tallow). Lard.  Sweets and desserts: Cookies. Cakes. Pies. Candy.  Seasoning and other foods: Mayonnaise. Premade sauces and marinades. The items listed may not be a complete list. Talk with your dietitian about what dietary choices are right for you. Summary  The Mediterranean diet includes both food and lifestyle choices.  Eat a variety of fresh fruits and vegetables, beans, nuts, seeds, and whole grains.  Limit the amount of red meat and sweets that you eat.  Talk with your health care provider about whether it is safe for you to drink red wine in moderation. This means 1 glass a day for nonpregnant women and 2 glasses a day for men. A glass of wine equals 5 oz (150 mL). This information  is not intended to replace advice given to you by your health care provider. Make sure you discuss any questions you have with your health care provider. Document Revised: 06/23/2016 Document Reviewed: 06/16/2016 Elsevier Patient Education  [place] Maintenance After Age 84 After age 58, you are at a higher risk for certain long-term diseases and infections as well as injuries from falls. Falls are a major cause of broken bones and head injuries in people who are older than age 17. Getting regular preventive care can help to keep you healthy and well. Preventive care includes getting regular testing and making lifestyle changes as recommended by your health care provider. Talk with your health care provider about:  Which screenings and tests you should have. A screening is a test that checks for a disease when you have no symptoms.  A diet and exercise plan that is right for you. What should I know about screenings and tests to prevent falls? Screening and testing are the best ways to find a health problem early. Early diagnosis and treatment give you the best chance of managing medical conditions that are common after age 82. Certain conditions and lifestyle choices may make you more likely to have a fall. Your  health care provider may recommend:  Regular vision checks. Poor vision and conditions such as cataracts can make you more likely to have a fall. If you wear glasses, make sure to get your prescription updated if your vision changes.  Medicine review. Work with your health care provider to regularly review all of the medicines you are taking, including over-the-counter medicines. Ask your health care provider about any side effects that may make you more likely to have a fall. Tell your health care provider if any medicines that you take make you feel dizzy or sleepy.  Osteoporosis screening. Osteoporosis is a condition that causes the bones to get weaker. This can make the  bones weak and cause them to break more easily.  Blood pressure screening. Blood pressure changes and medicines to control blood pressure can make you feel dizzy.  Strength and balance checks. Your health care provider may recommend certain tests to check your strength and balance while standing, walking, or changing positions.  Foot health exam. Foot pain and numbness, as well as not wearing proper footwear, can make you more likely to have a fall.  Depression screening. You may be more likely to have a fall if you have a fear of falling, feel emotionally low, or feel unable to do activities that you used to do.  Alcohol use screening. Using too much alcohol can affect your balance and may make you more likely to have a fall. What actions can I take to lower my risk of falls? General instructions  Talk with your health care provider about your risks for falling. Tell your health care provider if: ? You fall. Be sure to tell your health care provider about all falls, even ones that seem minor. ? You feel dizzy, sleepy, or off-balance.  Take over-the-counter and prescription medicines only as told by your health care provider. These include any supplements.  Eat a healthy diet and maintain a healthy weight. A healthy diet includes low-fat dairy products, low-fat (lean) meats, and fiber from whole grains, beans, and lots of fruits and vegetables. Home safety  Remove any tripping hazards, such as rugs, cords, and clutter.  Install safety equipment such as grab bars in bathrooms and safety rails on stairs.  Keep rooms and walkways well-lit. Activity  Follow a regular exercise program to stay fit. This will help you maintain your balance. Ask your health care provider what types of exercise are appropriate for you.  If you need a cane or walker, use it as recommended by your health care provider.  Wear supportive shoes that have nonskid soles.   Lifestyle  Do not drink alcohol if your  health care provider tells you not to drink.  If you drink alcohol, limit how much you have: ? 0-1 drink a day for women. ? 0-2 drinks a day for men.  Be aware of how much alcohol is in your drink. In the [place] one drink equals one typical bottle of beer (12 oz), one-half glass of wine (5 oz), or one shot of hard liquor (1 oz).  Do not use any products that contain nicotine or tobacco, such as cigarettes and e-cigarettes. If you need help quitting, ask your health care provider. Summary  Having a healthy lifestyle and getting preventive care can help to protect your health and wellness after age 29.  Screening and testing are the best way to find a health problem early and help you avoid having a fall. Early diagnosis and treatment give you the  best chance for managing medical conditions that are more common for people who are older than age 57.  Falls are a major cause of broken bones and head injuries in people who are older than age 59. Take precautions to prevent a fall at home.  Work with your health care provider to learn what changes you can make to improve your health and wellness and to prevent falls. This information is not intended to replace advice given to you by your health care provider. Make sure you discuss any questions you have with your health care provider. Document Revised: 02/14/2019 Document Reviewed: 09/06/2017 Elsevier Patient Education  2021 [place]. Cholesterol Content in Foods Cholesterol is a waxy, fat-like substance that helps to carry fat in the blood. The body needs cholesterol in small amounts, but too much cholesterol can cause damage to the arteries and heart. Most people should eat less than 200 milligrams (mg) of cholesterol a day. Foods with cholesterol Cholesterol is found in animal-based foods, such as meat, seafood, and dairy. Generally, low-fat dairy and lean meats have less cholesterol than full-fat dairy and fatty meats. The milligrams of  cholesterol per serving (mg per serving) of common cholesterol-containing foods are listed below. Meat and other proteins  Egg -- one large whole egg has 186 mg.  Veal shank -- 4 oz has 141 mg.  Lean ground [place] (93% lean) -- 4 oz has 118 mg.  Fat-trimmed lamb loin -- 4 oz has 106 mg.  Lean ground beef (90% lean) -- 4 oz has 100 mg.  Lobster -- 3.5 oz has 90 mg.  Pork loin chops -- 4 oz has 86 mg.  Canned salmon -- 3.5 oz has 83 mg.  Fat-trimmed beef top loin -- 4 oz has 78 mg.  Frankfurter -- 1 frank (3.5 oz) has 77 mg.  Crab -- 3.5 oz has 71 mg.  Roasted chicken without skin, white meat -- 4 oz has 66 mg.  Light bologna -- 2 oz has 45 mg.  Deli-cut [place] -- 2 oz has 31 mg.  Canned tuna -- 3.5 oz has 31 mg.  Berniece Salines -- 1 oz has 29 mg.  Oysters and mussels (raw) -- 3.5 oz has 25 mg.  Mackerel -- 1 oz has 22 mg.  Trout -- 1 oz has 20 mg.  Pork sausage -- 1 link (1 oz) has 17 mg.  Salmon -- 1 oz has 16 mg.  Tilapia -- 1 oz has 14 mg. Dairy  Soft-serve ice cream --  cup (4 oz) has 103 mg.  Whole-milk yogurt -- 1 cup (8 oz) has 29 mg.  Cheddar cheese -- 1 oz has 28 mg.  American cheese -- 1 oz has 28 mg.  Whole milk -- 1 cup (8 oz) has 23 mg.  2% milk -- 1 cup (8 oz) has 18 mg.  Cream cheese -- 1 tablespoon (Tbsp) has 15 mg.  Cottage cheese --  cup (4 oz) has 14 mg.  Low-fat (1%) milk -- 1 cup (8 oz) has 10 mg.  Sour cream -- 1 Tbsp has 8.5 mg.  Low-fat yogurt -- 1 cup (8 oz) has 8 mg.  Nonfat Greek yogurt -- 1 cup (8 oz) has 7 mg.  Half-and-half cream -- 1 Tbsp has 5 mg. Fats and oils  Cod liver oil -- 1 tablespoon (Tbsp) has 82 mg.  Butter -- 1 Tbsp has 15 mg.  Lard -- 1 Tbsp has 14 mg.  Bacon grease -- 1 Tbsp has 14  mg.  Mayonnaise -- 1 Tbsp has 5-10 mg.  Margarine -- 1 Tbsp has 3-10 mg. Exact amounts of cholesterol in these foods may vary depending on specific ingredients and brands.   Foods without cholesterol Most  plant-based foods do not have cholesterol unless you combine them with a food that has cholesterol. Foods without cholesterol include:  Grains and cereals.  Vegetables.  Fruits.  Vegetable oils, such as olive, canola, and sunflower oil.  Legumes, such as peas, beans, and lentils.  Nuts and seeds.  Egg whites.   Summary  The body needs cholesterol in small amounts, but too much cholesterol can cause damage to the arteries and heart.  Most people should eat less than 200 milligrams (mg) of cholesterol a day. This information is not intended to replace advice given to you by your health care provider. Make sure you discuss any questions you have with your health care provider. Document Revised: 03/16/2020 Document Reviewed: 03/16/2020 Elsevier Patient Education  2021 [place] for [employer] Calories are units of energy. Your body needs a certain number of calories from food to keep going throughout the day. When you eat or drink more calories than your body needs, your body stores the extra calories mostly as fat. When you eat or drink fewer calories than your body needs, your body burns fat to get the energy it needs. Calorie counting means keeping track of how many calories you eat and drink each day. Calorie counting can be helpful if you need to lose weight. If you eat fewer calories than your body needs, you should lose weight. Ask your health care provider what a healthy weight is for you. For calorie counting to work, you will need to eat the right number of calories each day to lose a healthy amount of weight per week. A dietitian can help you figure out how many calories you need in a day and will suggest ways to reach your calorie goal.  A healthy amount of weight to lose each week is usually 1-2 lb (0.5-0.9 kg). This usually means that your daily calorie intake should be reduced by 500-750 calories.  Eating 1,200-1,500 calories a day can help most  women lose weight.  Eating 1,500-1,800 calories a day can help most men lose weight. What do I need to know about calorie counting? Work with your health care provider or dietitian to determine how many calories you should get each day. To meet your daily calorie goal, you will need to:  Find out how many calories are in each food that you would like to eat. Try to do this before you eat.  Decide how much of the food you plan to eat.  Keep a food log. Do this by writing down what you ate and how many calories it had. To successfully lose weight, it is important to balance calorie counting with a healthy lifestyle that includes regular activity. Where do I find calorie information? The number of calories in a food can be found on a Nutrition Facts label. If a food does not have a Nutrition Facts label, try to look up the calories online or ask your dietitian for help. Remember that calories are listed per serving. If you choose to have more than one serving of a food, you will have to multiply the calories per serving by the number of servings you plan to eat. For example, the label on a package of bread might say that a serving  size is 1 slice and that there are 90 calories in a serving. If you eat 1 slice, you will have eaten 90 calories. If you eat 2 slices, you will have eaten 180 calories.   How do I keep a food log? After each time that you eat, record the following in your food log as soon as possible:  What you ate. Be sure to include toppings, sauces, and other extras on the food.  How much you ate. This can be measured in cups, ounces, or number of items.  How many calories were in each food and drink.  The total number of calories in the food you ate. Keep your food log near you, such as in a pocket-sized notebook or on an app or website on your mobile phone. Some programs will calculate calories for you and show you how many calories you have left to meet your daily goal. What  are some portion-control tips?  Know how many calories are in a serving. This will help you know how many servings you can have of a certain food.  Use a measuring cup to measure serving sizes. You could also try weighing out portions on a kitchen scale. With time, you will be able to estimate serving sizes for some foods.  Take time to put servings of different foods on your favorite plates or in your favorite bowls and cups so you know what a serving looks like.  Try not to eat straight from a food's packaging, such as from a bag or box. Eating straight from the package makes it hard to see how much you are eating and can lead to overeating. Put the amount you would like to eat in a cup or on a plate to make sure you are eating the right portion.  Use smaller plates, glasses, and bowls for smaller portions and to prevent overeating.  Try not to multitask. For example, avoid watching TV or using your computer while eating. If it is time to eat, sit down at a table and enjoy your food. This will help you recognize when you are full. It will also help you be more mindful of what and how much you are eating. What are tips for following this plan? Reading food labels  Check the calorie count compared with the serving size. The serving size may be smaller than what you are used to eating.  Check the source of the calories. Try to choose foods that are high in protein, fiber, and vitamins, and low in saturated fat, trans fat, and sodium. Shopping  Read nutrition labels while you shop. This will help you make healthy decisions about which foods to buy.  Pay attention to nutrition labels for low-fat or fat-free foods. These foods sometimes have the same number of calories or more calories than the full-fat versions. They also often have added sugar, starch, or salt to make up for flavor that was removed with the fat.  Make a grocery list of lower-calorie foods and stick to it. Cooking  Try to  cook your favorite foods in a healthier way. For example, try baking instead of frying.  Use low-fat dairy products. Meal planning  Use more fruits and vegetables. One-half of your plate should be fruits and vegetables.  Include lean proteins, such as chicken, [place], and fish. Lifestyle Each week, aim to do one of the following:  150 minutes of moderate exercise, such as walking.  75 minutes of vigorous exercise, such as running. General  information  Know how many calories are in the foods you eat most often. This will help you calculate calorie counts faster.  Find a way of tracking calories that works for you. Get creative. Try different apps or programs if writing down calories does not work for you. What foods should I eat?  Eat nutritious foods. It is better to have a nutritious, high-calorie food, such as an avocado, than a food with few nutrients, such as a bag of potato chips.  Use your calories on foods and drinks that will fill you up and will not leave you hungry soon after eating. ? Examples of foods that fill you up are nuts and nut butters, vegetables, lean proteins, and high-fiber foods such as whole grains. High-fiber foods are foods with more than 5 g of fiber per serving.  Pay attention to calories in drinks. Low-calorie drinks include water and unsweetened drinks. The items listed above may not be a complete list of foods and beverages you can eat. Contact a dietitian for more information.   What foods should I limit? Limit foods or drinks that are not good sources of vitamins, minerals, or protein or that are high in unhealthy fats. These include:  Candy.  Other sweets.  Sodas, specialty coffee drinks, alcohol, and juice. The items listed above may not be a complete list of foods and beverages you should avoid. Contact a dietitian for more information. How do I count calories when eating out?  Pay attention to portions. Often, portions are much larger when  eating out. Try these tips to keep portions smaller: ? Consider sharing a meal instead of getting your own. ? If you get your own meal, eat only half of it. Before you start eating, ask for a container and put half of your meal into it. ? When available, consider ordering smaller portions from the menu instead of full portions.  Pay attention to your food and drink choices. Knowing the way food is cooked and what is included with the meal can help you eat fewer calories. ? If calories are listed on the menu, choose the lower-calorie options. ? Choose dishes that include vegetables, fruits, whole grains, low-fat dairy products, and lean proteins. ? Choose items that are boiled, broiled, grilled, or steamed. Avoid items that are buttered, battered, fried, or served with cream sauce. Items labeled as crispy are usually fried, unless stated otherwise. ? Choose water, low-fat milk, unsweetened iced tea, or other drinks without added sugar. If you want an alcoholic beverage, choose a lower-calorie option, such as a glass of wine or light beer. ? Ask for dressings, sauces, and syrups on the side. These are usually high in calories, so you should limit the amount you eat. ? If you want a salad, choose a garden salad and ask for grilled meats. Avoid extra toppings such as bacon, cheese, or fried items. Ask for the dressing on the side, or ask for olive oil and vinegar or lemon to use as dressing.  Estimate how many servings of a food you are given. Knowing serving sizes will help you be aware of how much food you are eating at restaurants. Where to find more information  Centers for Disease Control and Prevention: http://www.wolf.info/  [place] Department of Agriculture: http://www.wilson-mendoza.org/ Summary  Calorie counting means keeping track of how many calories you eat and drink each day. If you eat fewer calories than your body needs, you should lose weight.  A healthy amount of weight to lose  per week is usually 1-2 lb  (0.5-0.9 kg). This usually means reducing your daily calorie intake by 500-750 calories.  The number of calories in a food can be found on a Nutrition Facts label. If a food does not have a Nutrition Facts label, try to look up the calories online or ask your dietitian for help.  Use smaller plates, glasses, and bowls for smaller portions and to prevent overeating.  Use your calories on foods and drinks that will fill you up and not leave you hungry shortly after a meal. This information is not intended to replace advice given to you by your health care provider. Make sure you discuss any questions you have with your health care provider. Document Revised: 12/05/2019 Document Reviewed: 12/05/2019 Elsevier Patient Education  2021 [place] and Cholesterol Restricted Eating Plan Getting too much fat and cholesterol in your diet may cause health problems. Choosing the right foods helps keep your fat and cholesterol at normal levels. This can keep you from getting certain diseases. Your doctor may recommend an eating plan that includes:  Total fat: ______% or less of total calories a day.  Saturated fat: ______% or less of total calories a day.  Cholesterol: less than _________mg a day.  Fiber: ______g a day. What are tips for following this plan? Meal planning  At meals, divide your plate into four equal parts: ? Fill one-half of your plate with vegetables and green salads. ? Fill one-fourth of your plate with whole grains. ? Fill one-fourth of your plate with low-fat (lean) protein foods.  Eat fish that is high in omega-3 fats at least two times a week. This includes mackerel, tuna, sardines, and salmon.  Eat foods that are high in fiber, such as whole grains, beans, apples, broccoli, carrots, peas, and barley. General tips  Work with your doctor to lose weight if you need to.  Avoid: ? Foods with added sugar. ? Fried foods. ? Foods with partially hydrogenated  oils.  Limit alcohol intake to no more than 1 drink a day for nonpregnant women and 2 drinks a day for men. One drink equals 12 oz of beer, 5 oz of wine, or 1 oz of hard liquor.   Reading food labels  Check food labels for: ? Trans fats. ? Partially hydrogenated oils. ? Saturated fat (g) in each serving. ? Cholesterol (mg) in each serving. ? Fiber (g) in each serving.  Choose foods with healthy fats, such as: ? Monounsaturated fats. ? Polyunsaturated fats. ? Omega-3 fats.  Choose grain products that have whole grains. Look for the word "whole" as the first word in the ingredient list. Cooking  Cook foods using low-fat methods. These include baking, boiling, grilling, and broiling.  Eat more home-cooked foods. Eat at restaurants and buffets less often.  Avoid cooking using saturated fats, such as butter, cream, palm oil, palm kernel oil, and coconut oil. Recommended foods Fruits  All fresh, canned (in natural juice), or frozen fruits. Vegetables  Fresh or frozen vegetables (raw, steamed, roasted, or grilled). Green salads. Grains  Whole grains, such as whole wheat or whole grain breads, crackers, cereals, and pasta. Unsweetened oatmeal, bulgur, barley, quinoa, or brown rice. Corn or whole wheat flour tortillas. Meats and other protein foods  Ground beef (85% or leaner), grass-fed beef, or beef trimmed of fat. Skinless chicken or [place]. Ground chicken or [place]. Pork trimmed of fat. All fish and seafood. Egg whites. Dried beans, peas, or lentils. Unsalted nuts  or seeds. Unsalted canned beans. Nut butters without added sugar or oil. Dairy  Low-fat or nonfat dairy products, such as skim or 1% milk, 2% or reduced-fat cheeses, low-fat and fat-free ricotta or cottage cheese, or plain low-fat and nonfat yogurt. Fats and oils  Tub margarine without trans fats. Light or reduced-fat mayonnaise and salad dressings. Avocado. Olive, canola, sesame, or safflower oils. The items listed  above may not be a complete list of foods and beverages you can eat. Contact a dietitian for more information.   Foods to avoid Fruits  Canned fruit in heavy syrup. Fruit in cream or butter sauce. Fried fruit. Vegetables  Vegetables cooked in cheese, cream, or butter sauce. Fried vegetables. Grains  White bread. White pasta. White rice. Cornbread. Bagels, pastries, and croissants. Crackers and snack foods that contain trans fat and hydrogenated oils. Meats and other protein foods  Fatty cuts of meat. Ribs, chicken wings, bacon, sausage, bologna, salami, chitterlings, fatback, hot dogs, bratwurst, and packaged lunch meats. Liver and organ meats. Whole eggs and egg yolks. Chicken and [place] with skin. Fried meat. Dairy  Whole or 2% milk, cream, half-and-half, and cream cheese. Whole milk cheeses. Whole-fat or sweetened yogurt. Full-fat cheeses. Nondairy creamers and whipped toppings. Processed cheese, cheese spreads, and cheese curds. Beverages  Alcohol. Sugar-sweetened drinks such as sodas, lemonade, and fruit drinks. Fats and oils  Butter, stick margarine, lard, shortening, ghee, or bacon fat. Coconut, palm kernel, and palm oils. Sweets and desserts  Corn syrup, sugars, honey, and molasses. Candy. Jam and jelly. Syrup. Sweetened cereals. Cookies, pies, cakes, donuts, muffins, and ice cream. The items listed above may not be a complete list of foods and beverages you should avoid. Contact a dietitian for more information. Summary  Choosing the right foods helps keep your fat and cholesterol at normal levels. This can keep you from getting certain diseases.  At meals, fill one-half of your plate with vegetables and green salads.  Eat high-fiber foods, like whole grains, beans, apples, carrots, peas, and barley.  Limit added sugar, saturated fats, alcohol, and fried foods. This information is not intended to replace advice given to you by your health care provider. Make sure you  discuss any questions you have with your health care provider. Document Revised: 02/26/2020 Document Reviewed: 02/26/2020 Elsevier Patient Education  2021 [employer].

## 2020-12-23 ENCOUNTER — Telehealth (INDEPENDENT_AMBULATORY_CARE_PROVIDER_SITE_OTHER): Payer: No Typology Code available for payment source | Admitting: Adult Health

## 2020-12-23 ENCOUNTER — Ambulatory Visit: Payer: Self-pay | Admitting: *Deleted

## 2020-12-23 ENCOUNTER — Encounter: Payer: Self-pay | Admitting: Adult Health

## 2020-12-23 DIAGNOSIS — A084 Viral intestinal infection, unspecified: Secondary | ICD-10-CM

## 2020-12-23 DIAGNOSIS — R112 Nausea with vomiting, unspecified: Secondary | ICD-10-CM

## 2020-12-23 DIAGNOSIS — R197 Diarrhea, unspecified: Secondary | ICD-10-CM

## 2020-12-23 MED ORDER — ONDANSETRON HCL 4 MG PO TABS: 4.0000 mg | ORAL_TABLET | Freq: Three times a day (TID) | ORAL | 0 refills | Status: DC | PRN

## 2020-12-23 NOTE — Progress Notes (Addendum)
MyChart Video Visit    Virtual Visit via Video Note   This visit type was conducted due to national recommendations for restrictions regarding the COVID-19 Pandemic (e.g. social distancing) in an effort to limit this patient's exposure and mitigate transmission in our community. This patient is at least at moderate risk for complications without adequate follow up. This format is felt to be most appropriate for this patient at this time. Physical exam was limited by quality of the video and audio technology used for the visit.    Parties involved in visit as below:  Patient location: at home  Provider location: Provider: Provider's office at  [facility], [place].     I discussed the limitations of evaluation and management by telemedicine and the availability of in person appointments. The patient expressed understanding and agreed to proceed.  Patient: Karla Turner   DOB: October 19, 1951   70 y.o. Female  MRN: [id number] Visit Date: 12/23/2020  Today's healthcare provider: Marcille Buffy, FNP   Chief Complaint  Patient presents with  . Diarrhea   Subjective    Diarrhea  This is a new problem. The current episode started today. The problem has been unchanged. The stool consistency is described as watery. The patient states that diarrhea awakens her from sleep. Associated symptoms include myalgias and vomiting (describes as bile). Pertinent negatives include no abdominal pain, arthralgias, bloating, chills, coughing, fever, headaches, increased  flatus, sweats, URI or weight loss. Nothing aggravates the symptoms. There are no known risk factors. She has tried bismuth subsalicylate for the symptoms. history of gallstones    Onset was 12/23/20 this morning. 101.4 temperature.   She reports she got up this morning and has diarrhea, and vomiting that started this morning.   She was seen in he ER on last Sunday night 12/20/20 and she was seen for this and  cardiac work up and her chest x ray was normal. She did not have a covid test. She was having some epigastric discomfort. She does have some acid reflux symptoms she is on Prilosec.   Vomited x 7- 8 times.  She has had diarrhea round 5-6 times,  She slept for a few hours and woke up feeling better.  She has had diarrhea, no blood no dark stools.  Yellow vomit x 1. Seems to be slowing in symptoms.  She denies andy abdominal or chest pain currently.  Denies any mucous or blood in stools.  Sipping on water.   Denies any chest pain.   Patient  denies any fever, body aches,chills, rash, chest pain, shortness of breath.   Denies dizziness, lightheadedness, pre syncopal or syncopal episodes.   Patient Active Problem List   Diagnosis Date Noted  . Diarrhea 12/24/2020  . Viral gastroenteritis 12/24/2020  . Nausea and vomiting 12/24/2020  . Need for shingles vaccine 12/07/2020  . Low back pain without sciatica 12/07/2020  . Class 2 obesity due to excess calories with body mass index (BMI) of 35.0 to 35.9 in adult 09/02/2020  . Preventative health care 09/02/2020  . Localized osteoporosis without current pathological fracture 09/02/2020  . Hypothyroidism due to Hashimoto's thyroiditis 09/02/2020  . Other hyperlipidemia 06/02/2020  . Cyst of ovary 02/10/2020  . Acquired hypothyroidism 01/20/2020  . Glaucoma 01/20/2020  . Uterine fibroid 01/20/2020  . Bronchiectasis without complication ([place]) 23/53/6144  . History of diverticulitis 11/16/2018  . Dyslipidemia 11/16/2018  . Primary open angle glaucoma (POAG) of both eyes, mild stage 09/17/2018  .  Positive ANA (antinuclear antibody) 09/17/2018  . Erosive gastritis 06/18/2018  . Breast mass, right 05/30/2018  . Shortness of breath 12/18/2017  . Vitamin B12 deficiency anemia 12/18/2017  . Asthma 11/17/2017  . GERD (gastroesophageal reflux disease) 11/17/2017  . Hx of adenomatous colonic polyps 11/17/2017  . Body mass index (BMI) of  36.0-36.9 in adult 11/17/2017  . Pelvic mass in female 01/10/2017  . Varicose veins 07/24/2015  . Glaucoma suspect of right eye 04/15/2014  . Infection and inflammatory reaction due to other internal prosthetic devices, implants and grafts, initial encounter ([place]) 04/15/2014  . Pseudophakia of both eyes 04/15/2014  . Cystoid macular edema 03/18/2013  . Epiretinal membrane 03/18/2013   Past Medical History:  Diagnosis Date  . Arthritis   . Asthma   . Bronchitis   . Cataracts, bilateral   . Colon polyps   . Depression   . Diverticulitis   . GERD (gastroesophageal reflux disease)   . Glaucoma   . Hyperlipidemia   . Hypothyroidism   . Lumbar herniated disc   . Osteoporosis    Allergies  Allergen Reactions  . White Petrolatum     Other reaction(s): Other (See Comments) Pt states that she used Invisalign (which she states is a petroleum product and afterward developed systemic "poisoning."      Medications: Outpatient Medications Prior to Visit  Medication Sig  . albuterol (VENTOLIN HFA) 108 (90 Base) MCG/ACT inhaler Inhale 2 puffs into the lungs every 6 (six) hours as needed for wheezing or shortness of breath.  Marland Kitchen BREO ELLIPTA 100-25 MCG/INH AEPB INHALE ONE DOSE BY MOUTH DAILY  . brinzolamide (AZOPT) 1 % ophthalmic suspension Place 1 drop into the right eye 3 (three) times daily.  . budesonide (PULMICORT) 0.25 MG/2ML nebulizer solution TAKE 1 VIAL BY NEBULIZATION TWO TIMES A DAY AS NEEDED  . methazolamide (NEPTAZANE) 25 MG tablet Take 25 mg by mouth 3 (three) times daily.  . montelukast (SINGULAIR) 10 MG tablet TAKE ONE TABLET BY MOUTH AT BEDTIME  . omeprazole (PRILOSEC) 20 MG capsule Take 1 capsule (20 mg total) by mouth daily.  . Semaglutide,0.25 or 0.5MG /DOS, 2 MG/1.5ML SOPN Inject into the skin.   No facility-administered medications prior to visit.    Review of Systems  Constitutional: Negative for chills, fever and weight loss.  Respiratory: Negative for cough.    Gastrointestinal: Positive for diarrhea and vomiting (describes as bile). Negative for abdominal pain, bloating and flatus.  Musculoskeletal: Positive for myalgias. Negative for arthralgias.  Neurological: Negative for headaches.      Objective    There were no vitals taken for this visit.   Physical Exam  Patient is alert and oriented and responsive to questions Engages in conversation with provider. Speaks in full sentences without any pauses without any shortness of breath or distress.    Assessment & Plan      Viral gastroenteritis - Plan: COVID-19, Flu A+B and RSV, ondansetron (ZOFRAN) 4 MG tablet  Diarrhea, unspecified type - Plan: COVID-19, Flu A+B and RSV, ondansetron (ZOFRAN) 4 MG tablet  Nausea and vomiting, intractability of vomiting not specified, unspecified vomiting type  Meds ordered this encounter  Medications  . ondansetron (ZOFRAN) 4 MG tablet    Sig: Take 1 tablet (4 mg total) by mouth every 8 (eight) hours as needed for nausea or vomiting.    Dispense:  20 tablet    Refill:  0    Orders Placed This Encounter  Procedures  . COVID-19, Flu A+B and RSV  Red Flags discussed. The patient was given clear instructions to go to ER or return to medical center if any red flags develop, symptoms do not improve, worsen or new problems develop. They verbalized understanding.  Return in about 3 days (around 12/26/2020), or if symptoms worsen or fail to improve, for at any time for any worsening symptoms, Go to Emergency room/ urgent care if worse.     I discussed the assessment and treatment plan with the patient. The patient was provided an opportunity to ask questions and all were answered. The patient agreed with the plan and demonstrated an understanding of the instructions.   The patient was advised to call back or seek an in-person evaluation if the symptoms worsen or if the condition fails to improve as anticipated.  I provided 25  minutes of  non-face-to-face time during this encounter. I discussed the limitations of evaluation and management by telemedicine and the availability of in person appointments. The patient expressed understanding and agreed to proceed.  The entirety of the information documented in the History of Present Illness, Review of Systems and Physical Exam were personally obtained by me. Portions of this information were initially documented by the CMA and reviewed by me for thoroughness and accuracy.     Marcille Buffy, [place] [phone] (phone) [phone] (fax)  Olivia Lopez de Gutierrez

## 2020-12-23 NOTE — Telephone Encounter (Signed)
Patient states she is having nausea and vomiting- diarrhea that started this morning. Patient is not sure where her symptoms came from- she states she has been told she has gallstones and she wonders if she is having symptoms from them. Appointment scheduled for this afternoon.  Reason for Disposition . [1] SEVERE diarrhea (e.g., 7 or more times / day more than normal) AND [2] age > 60 years  Answer Assessment - Initial Assessment Questions 1. DIARRHEA SEVERITY: "How bad is the diarrhea?" "How many extra stools have you had in the past 24 hours than normal?"    - NO DIARRHEA (SCALE 0)   - MILD (SCALE 1-3): Few loose or mushy BMs; increase of 1-3 stools over normal daily number of stools; mild increase in ostomy output.   -  MODERATE (SCALE 4-7): Increase of 4-6 stools daily over normal; moderate increase in ostomy output. * SEVERE (SCALE 8-10; OR 'WORST POSSIBLE'): Increase of 7 or more stools daily over normal; moderate increase in ostomy output; incontinence.     moderate 2. ONSET: "When did the diarrhea begin?"      This morning 3. BM CONSISTENCY: "How loose or watery is the diarrhea?"      watery 4. VOMITING: "Are you also vomiting?" If Yes, ask: "How many times in the past 24 hours?"      Yes- twice 5. ABDOMINAL PAIN: "Are you having any abdominal pain?" If Yes, ask: "What does it feel like?" (e.g., crampy, dull, intermittent, constant)      No- some discomfort 6. ABDOMINAL PAIN SEVERITY: If present, ask: "How bad is the pain?"  (e.g., Scale 1-10; mild, moderate, or severe)   - MILD (1-3): doesn't interfere with normal activities, abdomen soft and not tender to touch    - MODERATE (4-7): interferes with normal activities or awakens from sleep, tender to touch    - SEVERE (8-10): excruciating pain, doubled over, unable to do any normal activities       mild 7. ORAL INTAKE: If vomiting, "Have you been able to drink liquids?" "How much fluids have you had in the past 24 hours?"     This  morning- patient has not felt like eating anything 8. HYDRATION: "Any signs of dehydration?" (e.g., dry mouth [not just dry lips], too weak to stand, dizziness, new weight loss) "When did you last urinate?"     All symptoms started this morning 9. EXPOSURE: "Have you traveled to a foreign country recently?" "Have you been exposed to anyone with diarrhea?" "Could you have eaten any food that was spoiled?"     no 10. ANTIBIOTIC USE: "Are you taking antibiotics now or have you taken antibiotics in the past 2 months?"       no 11. OTHER SYMPTOMS: "Do you have any other symptoms?" (e.g., fever, blood in stool)       BP/P- 82 acceptable, O2 sat normal-97 12. PREGNANCY: "Is there any chance you are pregnant?" "When was your last menstrual period?"       n/a  Protocols used: DIARRHEA-A-AH

## 2020-12-24 DIAGNOSIS — R112 Nausea with vomiting, unspecified: Secondary | ICD-10-CM | POA: Insufficient documentation

## 2020-12-24 DIAGNOSIS — R197 Diarrhea, unspecified: Secondary | ICD-10-CM | POA: Insufficient documentation

## 2020-12-24 DIAGNOSIS — A084 Viral intestinal infection, unspecified: Secondary | ICD-10-CM | POA: Insufficient documentation

## 2020-12-24 LAB — COVID-19, FLU A+B AND RSV
Influenza A, NAA: NOT DETECTED
Influenza B, NAA: NOT DETECTED
RSV, NAA: NOT DETECTED
SARS-CoV-2, NAA: NOT DETECTED

## 2020-12-24 NOTE — Patient Instructions (Signed)

## 2020-12-24 NOTE — Progress Notes (Signed)
Negative covid, flu and rsv.

## 2020-12-25 ENCOUNTER — Ambulatory Visit: Payer: No Typology Code available for payment source | Admitting: Physician Assistant

## 2020-12-25 ENCOUNTER — Telehealth: Payer: No Typology Code available for payment source | Admitting: Physician Assistant

## 2020-12-25 DIAGNOSIS — K8020 Calculus of gallbladder without cholecystitis without obstruction: Secondary | ICD-10-CM | POA: Insufficient documentation

## 2020-12-29 ENCOUNTER — Telehealth: Payer: Self-pay | Admitting: Pulmonary Disease

## 2020-12-29 NOTE — Telephone Encounter (Signed)
Correction, patient is now using oxygen. :)  Patient called back to state she was given a number to call if we are unable to see the images [phone].   Number called per patient request, this is a patient D/C line. They had no information regarding imaging.   Made patient aware we would have to request the imaging in order for MD to give 2nd opinion. Voiced understanding.

## 2020-12-29 NOTE — Telephone Encounter (Signed)
Call made to patient, confirmed DOB. She reports that she is now having to use pneumonia. Patient reports she was recently in hospital at [facility] and she wanted MD opinion. I made her aware we are unable to see the images from [facility]. I made her aware we could access the D/C summary so I would get this information to the MD however we cannot see the images and she would have to sign a release of medical information form. She asked that we fax the form to [phone] so she can sign it. She reports they started her on 2L of oxygen. She wants to know what all this means moving forward.   Release of information form faxed to patient.   Will route message to MD/Nurse to make aware to be on ok look out for form as well as to review D/C summary at patient request.

## 2020-12-29 NOTE — Telephone Encounter (Signed)
Noted - seems she is receiving appropriate care by her inpatient team. Thank you.

## 2020-12-29 NOTE — Telephone Encounter (Signed)
Left message for patient to call back. Will need to see where the xrays were done as the last one in her chart was back in January 2022.

## 2021-01-01 NOTE — Progress Notes (Signed)
Provider attempted patient call but unable to reach.

## 2021-01-05 HISTORY — PX: CHOLECYSTECTOMY: SHX55

## 2021-01-06 NOTE — Telephone Encounter (Signed)
Noted.  Called made to patient, confirmed DOB. Patient states she faxed it back the same day. I made her aware we still have not received the form she is going to have her son re-fax the signed release. Patient's husband is in the hospital so she is not able to come by the office at this time.

## 2021-01-07 NOTE — Telephone Encounter (Signed)
[place] or [place], have you seen this release form yet? Thanks.

## 2021-01-07 NOTE — Telephone Encounter (Signed)
No forms noted at this time.

## 2021-01-07 NOTE — Telephone Encounter (Signed)
Patient checking on release form. Patient states having symptom of cough Patient phone number is [phone].

## 2021-01-07 NOTE — Telephone Encounter (Signed)
I checked MH's folders, there are no forms or papers on this patient.

## 2021-01-07 NOTE — Telephone Encounter (Signed)
No forms noted at this time.  I just checked the fax machine.

## 2021-01-08 NOTE — Telephone Encounter (Signed)
[facility] records are available in care everywhere  No need for form  Spoke with pt, who is c/o lingering cough after hospitalization  Ov scheduled for 01/14/21 with MH  Nothing further needed

## 2021-01-14 ENCOUNTER — Ambulatory Visit (INDEPENDENT_AMBULATORY_CARE_PROVIDER_SITE_OTHER): Payer: No Typology Code available for payment source | Admitting: Pulmonary Disease

## 2021-01-14 ENCOUNTER — Encounter: Payer: Self-pay | Admitting: Pulmonary Disease

## 2021-01-14 ENCOUNTER — Other Ambulatory Visit: Payer: Self-pay

## 2021-01-14 VITALS — BP 116/74 | HR 85 | Temp 97.4°F | Ht 64.0 in | Wt 206.2 lb

## 2021-01-14 DIAGNOSIS — J479 Bronchiectasis, uncomplicated: Secondary | ICD-10-CM | POA: Diagnosis not present

## 2021-01-14 DIAGNOSIS — J4520 Mild intermittent asthma, uncomplicated: Secondary | ICD-10-CM

## 2021-01-14 DIAGNOSIS — J690 Pneumonitis due to inhalation of food and vomit: Secondary | ICD-10-CM

## 2021-01-14 NOTE — Progress Notes (Signed)
Patient ID: Karla Turner, female    DOB: 1951-04-02, 70 y.o.   MRN: [id number]  Chief Complaint  Patient presents with  . Hospitalization Follow-up    Was in the hospital at [facility] for PNA and UTI. States she is still slowly getting her strength back. Still has a cough, especially in the mornings. Was diagnosed with 2L of O2 has not used since 12/30/2020. Was discharged on 12/28/2020 from [facility].     Referring provider: Doreen Beam, F*  HPI 70 year old female, never smoked (exposed to second hand smoke). PMH significant for asthma, bronchiectasis, COPD, obesity, glaucoma, positive ANA, dyslipidemia, GERD. Patient of Dr. Lake Bells, seen for initial consult on 01/08/19. History of pneumonia as child. Alpha-1 antitrypsin and immunodeficency labs were normal. FENO normal. PFTs showed evidence of moderate restriction and air trapping consistent with asthma. Maintained on Breo 100 and prn albuterol.   01/14/21 Returns for follow-up.  Was hospitalized at [facility] mid-to-late February 2022 with cholecystitis.  Perioperatively developed hypoxemia chest x-ray on report showed lower lobe infiltrate consistent with aspiration pneumonia.  She was placed on antibiotics and steroids.  She gradually improved.  She was discharged on 2 L nasal cannula.  She quickly weaned off this at home while monitoring her oxygen saturations.  She has not used this in a couple weeks.  Overall she is slowly getting her stamina back.  A bit more short of breath, dyspnea on exertion but thinks is related to being laid up in the hospital.  Has some mild residual cough.  This is improving but is persisting.  ED 12/25/2020 reviewed.  ED note 12/25/2018 reviewed.  Discharge summary 12/30/2020 reviewed.  Chest x-ray result from that hospitalization reviewed.   08/17/20 Today she feels well. Was at the beach last week of [place] with her dog. Did not take her allergy meds for a few days. Developed nasal congestion, rhinorrhea and  cough. Then got SOB and started coughing more with change to green sputum. Did televisit with Derl Barrow and prescribed prednisone burst and Augmentin x 7 days. This greatly imrproved symptoms. Now resolved. Back to taking her allergy meds. Using breo daily.   Previous [facility] pulmonary encounter: 3/3/20Lake Bells, MD This is a pleasant 70 year old female who comes to my clinic today to establish care for repeated episodes of bronchitis ever since having childhood pneumonia.  She has increased airway caliber on my review of her CT chest consistent with bronchiectasis but this does seem more like a smooth cylindrical bronchiectasis rather than a cystic or mucopurulent variant.  I wonder about the possibility of concomitant asthma and I also wonder about some problems with her immune system such as immunoglobulin deficiency or an underlying alpha-1 antitrypsin deficiency.  06/28/2019 Patient presents today for follow-up with PFTs. States that she is doing well, her breathing has improved since being on Breo. She is using Breo 1 puff daily and albuterol nebulizer in the evening. Asthma currently being managed by her PCP. States that she requires frequent prednisone and antibiotics for bronchitis. Thinks she may have been exposed to covid in [place] back in [place].   12/30/2019 Patient presents today for 6 months follow-up. Breathing has been well controlled on Breo. Only time she required albuterol rescue inhaler is before she has to go out wearing mask d/t covid. She feels her symptoms are much better than last spring. In Feb 2020 she required 3-4 round of antibiotics for bronchitis symptoms. She has had no exacerbation requiring abx or  prednisone. She received first pfizer covid vaccine, she will receive her 2nd shot on March 3rd.   06/28/2019  PFTS- FVC 1.87 (62%), FEV1 1.41 (61%), ratio 76, TLC 80, normal DLCO   Chest imaging: November 2019 CT chest images independently reviewed showing no pulmonary  parenchymal abnormality with the exception of increased airway caliber specifically in the lower lobes bilaterally but no mucus plugging, no pulmonary parenchymal infiltrate, no masses. CT chest consistent with smooth cylindrical bronchiectasis rather than a cystic or mucopurulent variant.  FENO: 01/2019 11 ppb  Allergies  Allergen Reactions  . White Petrolatum     Other reaction(s): Other (See Comments) Pt states that she used Invisalign (which she states is a petroleum product and afterward developed systemic "poisoning."    Immunization History  Administered Date(s) Administered  . Fluad Quad(high Dose 65+) 08/27/2019, 08/17/2020  . Influenza, High Dose Seasonal PF 09/07/2018  . Influenza-Unspecified 09/05/2018, 08/17/2020  . PFIZER(Purple Top)SARS-COV-2 Vaccination 12/15/2019, 01/08/2020, 08/24/2020  . Pneumococcal Conjugate-13 09/07/2018  . Pneumococcal Polysaccharide-23 01/20/2020  . Pneumococcal-Unspecified 09/09/2014  . Tdap 06/09/2020  . Zoster Recombinat (Shingrix) 09/02/2020, 12/07/2020    Past Medical History:  Diagnosis Date  . Arthritis   . Asthma   . Bronchitis   . Cataracts, bilateral   . Colon polyps   . Depression   . Diverticulitis   . GERD (gastroesophageal reflux disease)   . Glaucoma   . Hyperlipidemia   . Hypothyroidism   . Lumbar herniated disc   . Osteoporosis     Tobacco History: Social History   Tobacco Use  Smoking Status Never Smoker  Smokeless Tobacco Never Used   Counseling given: Not Answered   Outpatient Medications Prior to Visit  Medication Sig Dispense Refill  . albuterol (VENTOLIN HFA) 108 (90 Base) MCG/ACT inhaler Inhale 2 puffs into the lungs every 6 (six) hours as needed for wheezing or shortness of breath. 1 each 11  . BREO ELLIPTA 100-25 MCG/INH AEPB INHALE ONE DOSE BY MOUTH DAILY 180 each 3  . brinzolamide (AZOPT) 1 % ophthalmic suspension Place 1 drop into the right eye 3 (three) times daily.    . budesonide  (PULMICORT) 0.25 MG/2ML nebulizer solution TAKE 1 VIAL BY NEBULIZATION TWO TIMES A DAY AS NEEDED 60 mL 4  . Levothyroxine Sodium 88 MCG CAPS Take 88 mcg by mouth daily.    . methazolamide (NEPTAZANE) 25 MG tablet Take 25 mg by mouth 3 (three) times daily.    . montelukast (SINGULAIR) 10 MG tablet TAKE ONE TABLET BY MOUTH AT BEDTIME 90 tablet 2  . omeprazole (PRILOSEC) 20 MG capsule Take 1 capsule (20 mg total) by mouth daily. 90 capsule 1  . ondansetron (ZOFRAN) 4 MG tablet Take 1 tablet (4 mg total) by mouth every 8 (eight) hours as needed for nausea or vomiting. 20 tablet 0  . Semaglutide,0.25 or 0.5MG /DOS, 2 MG/1.5ML SOPN Inject into the skin.     No facility-administered medications prior to visit.    Review of Systems n/a  Physical Exam  BP 116/74   Pulse 85   Temp (!) 97.4 F (36.3 C) (Temporal)   Ht 5\' 4"  (1.626 m)   Wt 206 lb 3.2 oz (93.5 kg)   SpO2 100% Comment: on RA  BMI 35.39 kg/m  General: well appearing, in NAD Respiratory: CTAB, no wheeze, good air movement Cardiovascular: RRR, no murmurs    Lab Results: Reviewed no elevated Eos CBC    Component Value Date/Time   WBC 5.5 09/02/2020 1038  RBC 4.67 09/02/2020 1038   HGB 14.0 09/02/2020 1038   HGB 13.8 08/24/2018 1111   HCT 42.5 09/02/2020 1038   HCT 41.8 08/24/2018 1111   PLT 215.0 09/02/2020 1038   PLT 218 08/24/2018 1111   MCV 90.9 09/02/2020 1038   MCV 91 08/24/2018 1111   MCV 88 02/21/2013 2153   MCH 30.1 08/24/2018 1111   MCH 31.1 07/20/2017 0827   MCHC 33.0 09/02/2020 1038   RDW 14.0 09/02/2020 1038   RDW 12.3 08/24/2018 1111   RDW 12.4 02/21/2013 2153   LYMPHSABS 1.4 09/02/2020 1038   LYMPHSABS 2.0 08/24/2018 1111   MONOABS 0.5 09/02/2020 1038   EOSABS 0.1 09/02/2020 1038   EOSABS 0.1 08/24/2018 1111   BASOSABS 0.1 09/02/2020 1038   BASOSABS 0.1 08/24/2018 1111    BMET    Component Value Date/Time   NA 141 09/02/2020 1038   NA 142 08/24/2018 1111   NA 139 02/21/2013 2153   K 4.0  09/02/2020 1038   K 3.7 02/21/2013 2153   CL 104 09/02/2020 1038   CL 108 (H) 02/21/2013 2153   CO2 29 09/02/2020 1038   CO2 27 02/21/2013 2153   GLUCOSE 94 09/02/2020 1038   GLUCOSE 135 (H) 02/21/2013 2153   BUN 13 09/02/2020 1038   BUN 16 08/24/2018 1111   BUN 12 02/21/2013 2153   CREATININE 0.74 09/02/2020 1038   CREATININE 0.67 02/21/2013 2153   CALCIUM 9.2 09/02/2020 1038   CALCIUM 8.5 02/21/2013 2153   GFRNONAA 75 08/24/2018 1111   GFRNONAA >60 02/21/2013 2153   GFRAA 87 08/24/2018 1111   GFRAA >60 02/21/2013 2153    BNP No results found for: BNP  ProBNP No results found for: PROBNP  Imaging: CT chest 09/2018: Mild basilar predominant bronchiectasis on my interperation   Assessment & Plan:   Aspiration Pneumonia: Peri-operative for cholecystectomy at [facility] 12/2020. Treated with prednisone, abx. Improving. Off O2. Expect will continue to improve with time, gradual increase in exertion.  Consider chest x-ray at the beginning of April 2022 for resolution, but if clinically still improving okay to not pursue this.  Asthma: Well controlled in general on Breo. Stressed importance of adherence to allergy regimen. Provided refill of albuterol inhaler. Has PRN nebs as well.   Bronchiectasis: mild. No sputum. CTM.    Lanier Clam, MD 01/14/2021

## 2021-01-14 NOTE — Patient Instructions (Signed)
Nice to see you again  I am glad you are feeling better  No medication changes today  We will look into cancelling the oxygen  Follow up with Dr. Silas Flood in 3 months

## 2021-01-20 ENCOUNTER — Ambulatory Visit (INDEPENDENT_AMBULATORY_CARE_PROVIDER_SITE_OTHER): Payer: No Typology Code available for payment source | Admitting: Adult Health

## 2021-01-20 ENCOUNTER — Other Ambulatory Visit: Payer: Self-pay

## 2021-01-20 ENCOUNTER — Encounter: Payer: Self-pay | Admitting: Adult Health

## 2021-01-20 VITALS — BP 90/42 | HR 86 | Temp 98.3°F | Resp 16 | Wt 201.6 lb

## 2021-01-20 DIAGNOSIS — Z87440 Personal history of urinary (tract) infections: Secondary | ICD-10-CM | POA: Diagnosis not present

## 2021-01-20 DIAGNOSIS — Z8701 Personal history of pneumonia (recurrent): Secondary | ICD-10-CM | POA: Diagnosis not present

## 2021-01-20 DIAGNOSIS — Z6834 Body mass index (BMI) 34.0-34.9, adult: Secondary | ICD-10-CM

## 2021-01-20 DIAGNOSIS — E7800 Pure hypercholesterolemia, unspecified: Secondary | ICD-10-CM

## 2021-01-20 DIAGNOSIS — J189 Pneumonia, unspecified organism: Secondary | ICD-10-CM

## 2021-01-20 DIAGNOSIS — Z9049 Acquired absence of other specified parts of digestive tract: Secondary | ICD-10-CM

## 2021-01-20 DIAGNOSIS — R7989 Other specified abnormal findings of blood chemistry: Secondary | ICD-10-CM | POA: Insufficient documentation

## 2021-01-20 DIAGNOSIS — N3090 Cystitis, unspecified without hematuria: Secondary | ICD-10-CM | POA: Insufficient documentation

## 2021-01-20 LAB — POCT URINALYSIS DIPSTICK
Bilirubin, UA: NEGATIVE
Blood, UA: NEGATIVE
Glucose, UA: NEGATIVE
Ketones, UA: NEGATIVE
Leukocytes, UA: NEGATIVE
Nitrite, UA: NEGATIVE
Protein, UA: NEGATIVE
Spec Grav, UA: 1.015 (ref 1.010–1.025)
Urobilinogen, UA: 0.2 E.U./dL
pH, UA: 5 (ref 5.0–8.0)

## 2021-01-20 NOTE — Patient Instructions (Addendum)
Chest x ray towards end of March and first part of April and this is at outpatient imaging center.   Community-Acquired Pneumonia, Adult Pneumonia is an infection of the lungs. It causes irritation and swelling in the airways of the lungs. Mucus and fluid may also build up inside the airways. This may cause coughing and trouble breathing. One type of pneumonia can happen while you are in a hospital. A different type can happen when you are not in a hospital (community-acquired pneumonia). What are the causes? This condition is caused by germs (viruses, bacteria, or fungi). Some types of germs can spread from person to person. Pneumonia is not thought to spread from person to person.   What increases the risk? You are more likely to develop this condition if:  You have a long-term (chronic) disease, such as: ? Disease of the lungs. This may be chronic obstructive pulmonary disease (COPD) or asthma. ? Heart failure. ? Cystic fibrosis. ? Diabetes. ? Kidney disease. ? Sickle cell disease. ? HIV.  You have other health problems, such as: ? Your body's defense system (immune system) is weak. ? A condition that may cause you to breathe in fluids from your mouth and nose.  You had your spleen taken out.  You do not take good care of your teeth and mouth (poor dental hygiene).  You use or have used tobacco products.  You travel where the germs that cause this illness are common.  You are near certain animals or the places they live.  You are older than 68 years of age. What are the signs or symptoms? Symptoms of this condition include:  A cough.  A fever.  Sweating or chills.  Chest pain, often when you breathe deeply or cough.  Breathing problems, such as: ? Fast breathing. ? Trouble breathing. ? Shortness of breath.  Feeling tired (fatigued).  Muscle aches. How is this treated? Treatment for this condition depends on many things, such as:  The cause of your  illness.  Your medicines.  Your other health problems. Most adults can be treated at home. Sometimes, treatment must happen in a hospital.  Treatment may include medicines to kill germs.  Medicines may depend on which germ caused your illness. Very bad pneumonia is rare. If you get it, you may:  Have a machine to help you breathe.  Have fluid taken away from around your lungs. Follow these instructions at home: Medicines  Take over-the-counter and prescription medicines only as told by your doctor.  Take cough medicine only if you are losing sleep. Cough medicine can keep your body from taking mucus away from your lungs.  If you were prescribed an antibiotic medicine, take it as told by your doctor. Do not stop taking the antibiotic even if you start to feel better. Lifestyle  Do not drink alcohol.  Do not use any products that contain nicotine or tobacco, such as cigarettes, e-cigarettes, and chewing tobacco. If you need help quitting, ask your doctor.  Eat a healthy diet. This includes a lot of vegetables, fruits, whole grains, low-fat dairy products, and low-fat (lean) protein.      General instructions  Rest a lot. Sleep for at least 8 hours each night.  Sleep with your head and neck raised. Put a few pillows under your head or sleep in a reclining chair.  Return to your normal activities as told by your doctor. Ask your doctor what activities are safe for you.  Drink enough fluid to keep  your pee (urine) pale yellow.  If your throat is sore, rinse your mouth often with salt water. To make salt water, dissolve -1 tsp (3-6 g) of salt in 1 cup (237 mL) of warm water.  Keep all follow-up visits as told by your doctor. This is important.   How is this prevented? You can lower your risk of pneumonia by:  Getting the pneumonia shot (vaccine). These shots have different types and schedules. Ask your doctor what works best for you. Think about getting this shot if: ? You  are older than 68 years of age. ? You are 17-73 years of age and:  You are being treated for cancer.  You have long-term lung disease.  You have other problems that affect your body's defense system. Ask your doctor if you have one of these.  Getting your flu shot every year. Ask your doctor which type of shot is best for you.  Going to the dentist as often as told.  Washing your hands often with soap and water for at least 20 seconds. If you cannot use soap and water, use hand sanitizer. Contact a doctor if:  You have a fever.  You lose sleep because your cough medicine does not help. Get help right away if:  You are short of breath and this gets worse.  You have more chest pain.  Your sickness gets worse. This is very serious if: ? You are an older adult. ? Your body's defense system is weak.  You cough up blood. These symptoms may be an emergency. Do not wait to see if the symptoms will go away. Get medical help right away. Call your local emergency services (911 in the [place] Do not drive yourself to the hospital. Summary  Pneumonia is an infection of the lungs.  Community-acquired pneumonia affects people who have not been in the hospital. Certain germs can cause this infection.  This condition may be treated with medicines that kill germs.  For very bad pneumonia, you may need a hospital stay and treatment to help with breathing. This information is not intended to replace advice given to you by your health care provider. Make sure you discuss any questions you have with your health care provider. Document Revised: 08/06/2019 Document Reviewed: 08/06/2019 Elsevier Patient Education  2021 [employer] Calories are units of energy. Your body needs a certain number of calories from food to keep going throughout the day. When you eat or drink more calories than your body needs, your body stores the extra calories mostly as fat. When  you eat or drink fewer calories than your body needs, your body burns fat to get the energy it needs. Calorie counting means keeping track of how many calories you eat and drink each day. Calorie counting can be helpful if you need to lose weight. If you eat fewer calories than your body needs, you should lose weight. Ask your health care provider what a healthy weight is for you. For calorie counting to work, you will need to eat the right number of calories each day to lose a healthy amount of weight per week. A dietitian can help you figure out how many calories you need in a day and will suggest ways to reach your calorie goal.  A healthy amount of weight to lose each week is usually 1-2 lb (0.5-0.9 kg). This usually means that your daily calorie intake should be reduced by 500-750 calories.  Eating  1,200-1,500 calories a day can help most women lose weight.  Eating 1,500-1,800 calories a day can help most men lose weight. What do I need to know about calorie counting? Work with your health care provider or dietitian to determine how many calories you should get each day. To meet your daily calorie goal, you will need to:  Find out how many calories are in each food that you would like to eat. Try to do this before you eat.  Decide how much of the food you plan to eat.  Keep a food log. Do this by writing down what you ate and how many calories it had. To successfully lose weight, it is important to balance calorie counting with a healthy lifestyle that includes regular activity. Where do I find calorie information? The number of calories in a food can be found on a Nutrition Facts label. If a food does not have a Nutrition Facts label, try to look up the calories online or ask your dietitian for help. Remember that calories are listed per serving. If you choose to have more than one serving of a food, you will have to multiply the calories per serving by the number of servings you plan to  eat. For example, the label on a package of bread might say that a serving size is 1 slice and that there are 90 calories in a serving. If you eat 1 slice, you will have eaten 90 calories. If you eat 2 slices, you will have eaten 180 calories.   How do I keep a food log? After each time that you eat, record the following in your food log as soon as possible:  What you ate. Be sure to include toppings, sauces, and other extras on the food.  How much you ate. This can be measured in cups, ounces, or number of items.  How many calories were in each food and drink.  The total number of calories in the food you ate. Keep your food log near you, such as in a pocket-sized notebook or on an app or website on your mobile phone. Some programs will calculate calories for you and show you how many calories you have left to meet your daily goal. What are some portion-control tips?  Know how many calories are in a serving. This will help you know how many servings you can have of a certain food.  Use a measuring cup to measure serving sizes. You could also try weighing out portions on a kitchen scale. With time, you will be able to estimate serving sizes for some foods.  Take time to put servings of different foods on your favorite plates or in your favorite bowls and cups so you know what a serving looks like.  Try not to eat straight from a food's packaging, such as from a bag or box. Eating straight from the package makes it hard to see how much you are eating and can lead to overeating. Put the amount you would like to eat in a cup or on a plate to make sure you are eating the right portion.  Use smaller plates, glasses, and bowls for smaller portions and to prevent overeating.  Try not to multitask. For example, avoid watching TV or using your computer while eating. If it is time to eat, sit down at a table and enjoy your food. This will help you recognize when you are full. It will also help you be  more mindful of what  and how much you are eating. What are tips for following this plan? Reading food labels  Check the calorie count compared with the serving size. The serving size may be smaller than what you are used to eating.  Check the source of the calories. Try to choose foods that are high in protein, fiber, and vitamins, and low in saturated fat, trans fat, and sodium. Shopping  Read nutrition labels while you shop. This will help you make healthy decisions about which foods to buy.  Pay attention to nutrition labels for low-fat or fat-free foods. These foods sometimes have the same number of calories or more calories than the full-fat versions. They also often have added sugar, starch, or salt to make up for flavor that was removed with the fat.  Make a grocery list of lower-calorie foods and stick to it. Cooking  Try to cook your favorite foods in a healthier way. For example, try baking instead of frying.  Use low-fat dairy products. Meal planning  Use more fruits and vegetables. One-half of your plate should be fruits and vegetables.  Include lean proteins, such as chicken, [place], and fish. Lifestyle Each week, aim to do one of the following:  150 minutes of moderate exercise, such as walking.  75 minutes of vigorous exercise, such as running. General information  Know how many calories are in the foods you eat most often. This will help you calculate calorie counts faster.  Find a way of tracking calories that works for you. Get creative. Try different apps or programs if writing down calories does not work for you. What foods should I eat?  Eat nutritious foods. It is better to have a nutritious, high-calorie food, such as an avocado, than a food with few nutrients, such as a bag of potato chips.  Use your calories on foods and drinks that will fill you up and will not leave you hungry soon after eating. ? Examples of foods that fill you up are nuts and nut  butters, vegetables, lean proteins, and high-fiber foods such as whole grains. High-fiber foods are foods with more than 5 g of fiber per serving.  Pay attention to calories in drinks. Low-calorie drinks include water and unsweetened drinks. The items listed above may not be a complete list of foods and beverages you can eat. Contact a dietitian for more information.   What foods should I limit? Limit foods or drinks that are not good sources of vitamins, minerals, or protein or that are high in unhealthy fats. These include:  Candy.  Other sweets.  Sodas, specialty coffee drinks, alcohol, and juice. The items listed above may not be a complete list of foods and beverages you should avoid. Contact a dietitian for more information. How do I count calories when eating out?  Pay attention to portions. Often, portions are much larger when eating out. Try these tips to keep portions smaller: ? Consider sharing a meal instead of getting your own. ? If you get your own meal, eat only half of it. Before you start eating, ask for a container and put half of your meal into it. ? When available, consider ordering smaller portions from the menu instead of full portions.  Pay attention to your food and drink choices. Knowing the way food is cooked and what is included with the meal can help you eat fewer calories. ? If calories are listed on the menu, choose the lower-calorie options. ? Choose dishes that include vegetables, fruits, whole  grains, low-fat dairy products, and lean proteins. ? Choose items that are boiled, broiled, grilled, or steamed. Avoid items that are buttered, battered, fried, or served with cream sauce. Items labeled as crispy are usually fried, unless stated otherwise. ? Choose water, low-fat milk, unsweetened iced tea, or other drinks without added sugar. If you want an alcoholic beverage, choose a lower-calorie option, such as a glass of wine or light beer. ? Ask for dressings,  sauces, and syrups on the side. These are usually high in calories, so you should limit the amount you eat. ? If you want a salad, choose a garden salad and ask for grilled meats. Avoid extra toppings such as bacon, cheese, or fried items. Ask for the dressing on the side, or ask for olive oil and vinegar or lemon to use as dressing.  Estimate how many servings of a food you are given. Knowing serving sizes will help you be aware of how much food you are eating at restaurants. Where to find more information  Centers for Disease Control and Prevention: http://www.wolf.info/  [place] Department of Agriculture: http://www.wilson-mendoza.org/ Summary  Calorie counting means keeping track of how many calories you eat and drink each day. If you eat fewer calories than your body needs, you should lose weight.  A healthy amount of weight to lose per week is usually 1-2 lb (0.5-0.9 kg). This usually means reducing your daily calorie intake by 500-750 calories.  The number of calories in a food can be found on a Nutrition Facts label. If a food does not have a Nutrition Facts label, try to look up the calories online or ask your dietitian for help.  Use smaller plates, glasses, and bowls for smaller portions and to prevent overeating.  Use your calories on foods and drinks that will fill you up and not leave you hungry shortly after a meal. This information is not intended to replace advice given to you by your health care provider. Make sure you discuss any questions you have with your health care provider. Document Revised: 12/05/2019 Document Reviewed: 12/05/2019 Elsevier Patient Education  2021 [employer] and Cholesterol Restricted Eating Plan Getting too much fat and cholesterol in your diet may cause health problems. Choosing the right foods helps keep your fat and cholesterol at normal levels. This can keep you from getting certain diseases. Your doctor may recommend an eating plan that includes:  Total fat:  ______% or less of total calories a day.  Saturated fat: ______% or less of total calories a day.  Cholesterol: less than _________mg a day.  Fiber: ______g a day. What are tips for following this plan? Meal planning  At meals, divide your plate into four equal parts: ? Fill one-half of your plate with vegetables and green salads. ? Fill one-fourth of your plate with whole grains. ? Fill one-fourth of your plate with low-fat (lean) protein foods.  Eat fish that is high in omega-3 fats at least two times a week. This includes mackerel, tuna, sardines, and salmon.  Eat foods that are high in fiber, such as whole grains, beans, apples, broccoli, carrots, peas, and barley. General tips  Work with your doctor to lose weight if you need to.  Avoid: ? Foods with added sugar. ? Fried foods. ? Foods with partially hydrogenated oils.  Limit alcohol intake to no more than 1 drink a day for nonpregnant women and 2 drinks a day for men. One drink equals 12 oz of beer, 5  oz of wine, or 1 oz of hard liquor.   Reading food labels  Check food labels for: ? Trans fats. ? Partially hydrogenated oils. ? Saturated fat (g) in each serving. ? Cholesterol (mg) in each serving. ? Fiber (g) in each serving.  Choose foods with healthy fats, such as: ? Monounsaturated fats. ? Polyunsaturated fats. ? Omega-3 fats.  Choose grain products that have whole grains. Look for the word "whole" as the first word in the ingredient list. Cooking  Cook foods using low-fat methods. These include baking, boiling, grilling, and broiling.  Eat more home-cooked foods. Eat at restaurants and buffets less often.  Avoid cooking using saturated fats, such as butter, cream, palm oil, palm kernel oil, and coconut oil. Recommended foods Fruits  All fresh, canned (in natural juice), or frozen fruits. Vegetables  Fresh or frozen vegetables (raw, steamed, roasted, or grilled). Green salads. Grains  Whole grains,  such as whole wheat or whole grain breads, crackers, cereals, and pasta. Unsweetened oatmeal, bulgur, barley, quinoa, or brown rice. Corn or whole wheat flour tortillas. Meats and other protein foods  Ground beef (85% or leaner), grass-fed beef, or beef trimmed of fat. Skinless chicken or [place]. Ground chicken or [place]. Pork trimmed of fat. All fish and seafood. Egg whites. Dried beans, peas, or lentils. Unsalted nuts or seeds. Unsalted canned beans. Nut butters without added sugar or oil. Dairy  Low-fat or nonfat dairy products, such as skim or 1% milk, 2% or reduced-fat cheeses, low-fat and fat-free ricotta or cottage cheese, or plain low-fat and nonfat yogurt. Fats and oils  Tub margarine without trans fats. Light or reduced-fat mayonnaise and salad dressings. Avocado. Olive, canola, sesame, or safflower oils. The items listed above may not be a complete list of foods and beverages you can eat. Contact a dietitian for more information.   Foods to avoid Fruits  Canned fruit in heavy syrup. Fruit in cream or butter sauce. Fried fruit. Vegetables  Vegetables cooked in cheese, cream, or butter sauce. Fried vegetables. Grains  White bread. White pasta. White rice. Cornbread. Bagels, pastries, and croissants. Crackers and snack foods that contain trans fat and hydrogenated oils. Meats and other protein foods  Fatty cuts of meat. Ribs, chicken wings, bacon, sausage, bologna, salami, chitterlings, fatback, hot dogs, bratwurst, and packaged lunch meats. Liver and organ meats. Whole eggs and egg yolks. Chicken and [place] with skin. Fried meat. Dairy  Whole or 2% milk, cream, half-and-half, and cream cheese. Whole milk cheeses. Whole-fat or sweetened yogurt. Full-fat cheeses. Nondairy creamers and whipped toppings. Processed cheese, cheese spreads, and cheese curds. Beverages  Alcohol. Sugar-sweetened drinks such as sodas, lemonade, and fruit drinks. Fats and oils  Butter, stick margarine,  lard, shortening, ghee, or bacon fat. Coconut, palm kernel, and palm oils. Sweets and desserts  Corn syrup, sugars, honey, and molasses. Candy. Jam and jelly. Syrup. Sweetened cereals. Cookies, pies, cakes, donuts, muffins, and ice cream. The items listed above may not be a complete list of foods and beverages you should avoid. Contact a dietitian for more information. Summary  Choosing the right foods helps keep your fat and cholesterol at normal levels. This can keep you from getting certain diseases.  At meals, fill one-half of your plate with vegetables and green salads.  Eat high-fiber foods, like whole grains, beans, apples, carrots, peas, and barley.  Limit added sugar, saturated fats, alcohol, and fried foods. This information is not intended to replace advice given to you by your health care provider.  Make sure you discuss any questions you have with your health care provider. Document Revised: 02/26/2020 Document Reviewed: 02/26/2020 Elsevier Patient Education  2021 [employer].

## 2021-01-20 NOTE — Progress Notes (Signed)
Urine check in office is normal given history of UTI E coli recemt will send for culture of urine.

## 2021-01-20 NOTE — Progress Notes (Signed)
Established patient visit   Patient: Karla Turner   DOB: 01-03-1951   70 y.o. Female  MRN: [id number] Visit Date: 01/20/2021  Today's healthcare provider: Marcille Buffy, FNP   Chief Complaint  Patient presents with  . Hospitalization Follow-up   Subjective    HPI  Follow up Hospitalization  Patient was admitted to [facility] on 12/25/20 and discharged on 12/28/20. She was treated for pneumonia of right lower lobe and acute cholecystitis . Treatment for this included prescribing Augmentin, Prednisone, Oxycodone and continue using Ventolin . Telephone follow up was done on 01/04/21 She reports excellent compliance with treatment. She reports this condition is improved.  Gallbladder was removed and she had pneumonia. Has had follow up pulmonary [facility],  On Breo inhaler.   Fatigue still otherwise she is feeling well.   Patient  denies any fever, body aches,chills, rash, chest pain, shortness of breath, nausea, vomiting, or diarrhea.  Denies dizziness, lightheadedness, pre syncopal or syncopal episodes.   No longer needs oxygen she had at home. -------------------------------------------------------------------------------- -   Patient Active Problem List   Diagnosis Date Noted  . Urinary tract infection without hematuria 01/22/2021  . History of pneumonia 01/20/2021  . History of cholecystectomy 01/20/2021  . Cystitis 01/20/2021  . Low TSH level 01/20/2021  . Pure hypercholesterolemia 01/20/2021  . Symptomatic cholelithiasis 12/25/2020  . Diarrhea 12/24/2020  . Viral gastroenteritis 12/24/2020  . Nausea and vomiting 12/24/2020  . Need for shingles vaccine 12/07/2020  . Low back pain without sciatica 12/07/2020  . Preventative health care 09/02/2020  . Localized osteoporosis without current pathological fracture 09/02/2020  . Hypothyroidism due to Hashimoto's thyroiditis 09/02/2020  . IFG (impaired fasting glucose) 09/02/2020  .  Other hyperlipidemia 06/02/2020  . Class 2 obesity 04/05/2020  . Cyst of ovary 02/10/2020  . Acquired hypothyroidism 01/20/2020  . Glaucoma 01/20/2020  . Uterine fibroid 01/20/2020  . Bronchiectasis without complication ([place]) 81/11/7508  . History of diverticulitis 11/16/2018  . Dyslipidemia 11/16/2018  . Primary open angle glaucoma (POAG) of both eyes, mild stage 09/17/2018  . Positive ANA (antinuclear antibody) 09/17/2018  . Erosive gastritis 06/18/2018  . Breast mass, right 05/30/2018  . Shortness of breath 12/18/2017  . Vitamin B12 deficiency anemia 12/18/2017  . Asthma 11/17/2017  . GERD (gastroesophageal reflux disease) 11/17/2017  . Hx of adenomatous colonic polyps 11/17/2017  . Body mass index (BMI) of 34.0-34.9 in adult 11/17/2017  . Pelvic mass in female 01/10/2017  . Varicose veins 07/24/2015  . Glaucoma suspect of right eye 04/15/2014  . Infection and inflammatory reaction due to other internal prosthetic devices, implants and grafts, initial encounter ([place]) 04/15/2014  . Pseudophakia of both eyes 04/15/2014  . Cystoid macular edema 03/18/2013  . Epiretinal membrane 03/18/2013   Past Medical History:  Diagnosis Date  . Arthritis   . Asthma   . Bronchitis   . Cataracts, bilateral   . Colon polyps   . Depression   . Diverticulitis   . GERD (gastroesophageal reflux disease)   . Glaucoma   . Hyperlipidemia   . Hypothyroidism   . Lumbar herniated disc   . Osteoporosis    Allergies  Allergen Reactions  . White Petrolatum     Other reaction(s): Other (See Comments) Pt states that she used Invisalign (which she states is a petroleum product and afterward developed systemic "poisoning."       Medications: Outpatient Medications Prior to Visit  Medication Sig  . acetaminophen (  TYLENOL) 500 MG tablet Take by mouth.  Marland Kitchen albuterol (VENTOLIN HFA) 108 (90 Base) MCG/ACT inhaler Inhale 2 puffs into the lungs every 6 (six) hours as needed for wheezing or shortness of  breath.  Marland Kitchen BREO ELLIPTA 100-25 MCG/INH AEPB INHALE ONE DOSE BY MOUTH DAILY  . brinzolamide (AZOPT) 1 % ophthalmic suspension Place 1 drop into the right eye 3 (three) times daily.  . budesonide (PULMICORT) 0.25 MG/2ML nebulizer solution TAKE 1 VIAL BY NEBULIZATION TWO TIMES A DAY AS NEEDED  . Levothyroxine Sodium 88 MCG CAPS Take 88 mcg by mouth daily.  . methazolamide (NEPTAZANE) 25 MG tablet Take 25 mg by mouth 3 (three) times daily.  . montelukast (SINGULAIR) 10 MG tablet TAKE ONE TABLET BY MOUTH AT BEDTIME  . omeprazole (PRILOSEC) 20 MG capsule Take 1 capsule (20 mg total) by mouth daily.  . ondansetron (ZOFRAN) 4 MG tablet Take 1 tablet (4 mg total) by mouth every 8 (eight) hours as needed for nausea or vomiting. (Patient not taking: Reported on 01/20/2021)  . Semaglutide,0.25 or 0.5MG /DOS, 2 MG/1.5ML SOPN Inject into the skin. (Patient not taking: Reported on 01/20/2021)   No facility-administered medications prior to visit.    Review of Systems  Constitutional: Positive for fatigue.  HENT: Negative.   Respiratory: Negative.   Cardiovascular: Negative.   Neurological: Negative.        Objective    BP (!) 90/42   Pulse 86   Temp 98.3 F (36.8 C) (Oral)   Resp 16   Wt 201 lb 9.6 oz (91.4 kg)   SpO2 100%   BMI 34.60 kg/m  BP Readings from Last 3 Encounters:  01/20/21 (!) 90/42  01/14/21 116/74  12/07/20 130/62   Wt Readings from Last 3 Encounters:  01/20/21 201 lb 9.6 oz (91.4 kg)  01/14/21 206 lb 3.2 oz (93.5 kg)  12/07/20 212 lb (96.2 kg)       Physical Exam Vitals reviewed.  Constitutional:      General: She is not in acute distress.    Appearance: She is well-developed. She is not diaphoretic.     Interventions: She is not intubated. HENT:     Head: Normocephalic and atraumatic.     Right Ear: External ear normal.     Left Ear: External ear normal.     Nose: Nose normal.     Mouth/Throat:     Pharynx: No oropharyngeal exudate.  Eyes:     General: Lids  are normal. No scleral icterus.       Right eye: No discharge.        Left eye: No discharge.     Conjunctiva/sclera: Conjunctivae normal.     Right eye: Right conjunctiva is not injected. No exudate or hemorrhage.    Left eye: Left conjunctiva is not injected. No exudate or hemorrhage.    Pupils: Pupils are equal, round, and reactive to light.  Neck:     Thyroid: No thyroid mass or thyromegaly.     Vascular: Normal carotid pulses. No carotid bruit, hepatojugular reflux or JVD.     Trachea: Trachea and phonation normal. No tracheal tenderness or tracheal deviation.     Meningeal: Brudzinski's sign and Kernig's sign absent.  Cardiovascular:     Rate and Rhythm: Normal rate and regular rhythm.     Pulses: Normal pulses.          Radial pulses are 2+ on the right side and 2+ on the left side.  Dorsalis pedis pulses are 2+ on the right side and 2+ on the left side.       Posterior tibial pulses are 2+ on the right side and 2+ on the left side.     Heart sounds: Normal heart sounds, S1 normal and S2 normal. Heart sounds not distant. No murmur heard. No friction rub. No gallop.   Pulmonary:     Effort: Pulmonary effort is normal. No tachypnea, bradypnea, accessory muscle usage or respiratory distress. She is not intubated.     Breath sounds: Normal breath sounds. No stridor. No wheezing or rales.  Chest:     Chest wall: No tenderness.  Breasts:     Right: No supraclavicular adenopathy.     Left: No supraclavicular adenopathy.    Abdominal:     General: Bowel sounds are normal. There is no distension or abdominal bruit.     Palpations: Abdomen is soft. There is no shifting dullness, fluid wave, hepatomegaly, splenomegaly, mass or pulsatile mass.     Tenderness: There is no abdominal tenderness. There is no guarding or rebound.     Hernia: No hernia is present.  Musculoskeletal:        General: No tenderness or deformity. Normal range of motion.     Cervical back: Full passive range  of motion without pain, normal range of motion and neck supple. No edema, erythema or rigidity. No spinous process tenderness or muscular tenderness. Normal range of motion.  Lymphadenopathy:     Head:     Right side of head: No submental, submandibular, tonsillar, preauricular, posterior auricular or occipital adenopathy.     Left side of head: No submental, submandibular, tonsillar, preauricular, posterior auricular or occipital adenopathy.     Cervical: No cervical adenopathy.     Right cervical: No superficial, deep or posterior cervical adenopathy.    Left cervical: No superficial, deep or posterior cervical adenopathy.     Upper Body:     Right upper body: No supraclavicular or pectoral adenopathy.     Left upper body: No supraclavicular or pectoral adenopathy.  Skin:    General: Skin is warm and dry.     Coloration: Skin is not pale.     Findings: No abrasion, bruising, burn, ecchymosis, erythema, lesion, petechiae or rash.     Nails: There is no clubbing.  Neurological:     Mental Status: She is alert and oriented to person, place, and time.     GCS: GCS eye subscore is 4. GCS verbal subscore is 5. GCS motor subscore is 6.     Cranial Nerves: No cranial nerve deficit.     Sensory: No sensory deficit.     Motor: No tremor, atrophy, abnormal muscle tone or seizure activity.     Coordination: Coordination normal.     Gait: Gait normal.     Deep Tendon Reflexes: Reflexes are normal and symmetric. Reflexes normal. Babinski sign absent on the right side. Babinski sign absent on the left side.     Reflex Scores:      Tricep reflexes are 2+ on the right side and 2+ on the left side.      Bicep reflexes are 2+ on the right side and 2+ on the left side.      Brachioradialis reflexes are 2+ on the right side and 2+ on the left side.      Patellar reflexes are 2+ on the right side and 2+ on the left side.  Achilles reflexes are 2+ on the right side and 2+ on the left side. Psychiatric:         Speech: Speech normal.        Behavior: Behavior normal.        Thought Content: Thought content normal.        Judgment: Judgment normal.      Results for orders placed or performed in visit on 01/20/21  Urine Culture   Specimen: Urine   Urine  Result Value Ref Range   Urine Culture, Routine Final report    Organism ID, Bacteria Comment   CBC with Differential/Platelet  Result Value Ref Range   WBC 5.3 3.4 - 10.8 x10E3/uL   RBC 4.48 3.77 - 5.28 x10E6/uL   Hemoglobin 13.0 11.1 - 15.9 g/dL   Hematocrit 40.2 34.0 - 46.6 %   MCV 90 79 - 97 fL   MCH 29.0 26.6 - 33.0 pg   MCHC 32.3 31.5 - 35.7 g/dL   RDW 12.6 11.7 - 15.4 %   Platelets 220 150 - 450 x10E3/uL   Neutrophils 50 Not Estab. %   Lymphs 30 Not Estab. %   Monocytes 12 Not Estab. %   Eos 7 Not Estab. %   Basos 1 Not Estab. %   Neutrophils Absolute 2.6 1.4 - 7.0 x10E3/uL   Lymphocytes Absolute 1.6 0.7 - 3.1 x10E3/uL   Monocytes Absolute 0.6 0.1 - 0.9 x10E3/uL   EOS (ABSOLUTE) 0.4 0.0 - 0.4 x10E3/uL   Basophils Absolute 0.1 0.0 - 0.2 x10E3/uL   Immature Granulocytes 0 Not Estab. %   Immature Grans (Abs) 0.0 0.0 - 0.1 x10E3/uL  Comprehensive Metabolic Panel (CMET)  Result Value Ref Range   Glucose 98 65 - 99 mg/dL   BUN 12 8 - 27 mg/dL   Creatinine, Ser 0.82 0.57 - 1.00 mg/dL   eGFR 78 >59 mL/min/1.73   BUN/Creatinine Ratio 15 12 - 28   Sodium 140 134 - 144 mmol/L   Potassium 4.5 3.5 - 5.2 mmol/L   Chloride 102 96 - 106 mmol/L   CO2 21 20 - 29 mmol/L   Calcium 9.6 8.7 - 10.3 mg/dL   Total Protein 7.0 6.0 - 8.5 g/dL   Albumin 4.3 3.8 - 4.8 g/dL   Globulin, Total 2.7 1.5 - 4.5 g/dL   Albumin/Globulin Ratio 1.6 1.2 - 2.2   Bilirubin Total 0.5 0.0 - 1.2 mg/dL   Alkaline Phosphatase 116 44 - 121 IU/L   AST 28 0 - 40 IU/L   ALT 20 0 - 32 IU/L  Lipid Panel w/o Chol/HDL Ratio  Result Value Ref Range   Cholesterol, Total 213 (H) 100 - 199 mg/dL   Triglycerides 170 (H) 0 - 149 mg/dL   HDL 46 >39 mg/dL   VLDL  Cholesterol Cal 30 5 - 40 mg/dL   LDL Chol Calc (NIH) 137 (H) 0 - 99 mg/dL  POCT urinalysis dipstick  Result Value Ref Range   Color, UA yellow    Clarity, UA clear    Glucose, UA Negative Negative   Bilirubin, UA negative    Ketones, UA negative    Spec Grav, UA 1.015 1.010 - 1.025   Blood, UA negative    pH, UA 5.0 5.0 - 8.0   Protein, UA Negative Negative   Urobilinogen, UA 0.2 0.2 or 1.0 E.U./dL   Nitrite, UA negative    Leukocytes, UA Negative Negative   Appearance     Odor  Assessment & Plan      History of pneumonia - Plan: CBC with Differential/Platelet, Comprehensive Metabolic Panel (CMET), DG Chest 2 View, CANCELED: DG Chest 2 View  History of cholecystectomy  History of urinary tract infection- e coli.  - Plan: POCT urinalysis dipstick, Urine Culture, CBC with Differential/Platelet, Comprehensive Metabolic Panel (CMET)  Low TSH level - Plan: TSH  Pure hypercholesterolemia - Plan: Lipid Panel w/o Chol/HDL Ratio, CANCELED: Lipid Panel w/o Chol/HDL Ratio  Body mass index (BMI) of 34.0-34.9 in adult  Orders Placed This Encounter  Procedures  . Urine Culture  . DG Chest 2 View  . CBC with Differential/Platelet  . Comprehensive Metabolic Panel (CMET)  . TSH  . Lipid Panel w/o Chol/HDL Ratio  . Lipid Panel w/o Chol/HDL Ratio  . POCT urinalysis dipstick    Follow up chest x ray ordered.  Red Flags discussed. The patient was given clear instructions to go to ER or return to medical center if any red flags develop, symptoms do not improve, worsen or new problems develop. They verbalized understanding.  Return in about 3 months (around 04/22/2021), or if symptoms worsen or fail to improve, for at any time for any worsening symptoms, Go to Emergency room/ urgent care if worse.     Marcille Buffy, [place] [phone] (phone) [phone] (fax)  [place]

## 2021-01-22 ENCOUNTER — Other Ambulatory Visit: Payer: Self-pay | Admitting: Adult Health

## 2021-01-22 ENCOUNTER — Encounter: Payer: Self-pay | Admitting: Adult Health

## 2021-01-22 DIAGNOSIS — N390 Urinary tract infection, site not specified: Secondary | ICD-10-CM | POA: Insufficient documentation

## 2021-01-22 LAB — URINE CULTURE

## 2021-01-22 MED ORDER — CEPHALEXIN 500 MG PO CAPS: 500.0000 mg | ORAL_CAPSULE | Freq: Three times a day (TID) | ORAL | 0 refills | Status: DC

## 2021-01-22 NOTE — Progress Notes (Signed)
Urine shows some bacteria less than 10,000 colonies, being she was recently hospitalized and had a E coli urine infection and is still having fatigue - I have sent in Keflex for her to take three times a day for 7 days. Follow up if any worsening symptoms at anytime .  Meds ordered this encounter Medications  cephALEXin (KEFLEX) 500 MG capsule   Sig: Take 1 capsule (500 mg total) by mouth 3 (three) times daily.   Dispense:  21 capsule   Refill:  0

## 2021-01-22 NOTE — Progress Notes (Signed)
Meds ordered this encounter  Medications  . cephALEXin (KEFLEX) 500 MG capsule    Sig: Take 1 capsule (500 mg total) by mouth 3 (three) times daily.    Dispense:  21 capsule    Refill:  0   Urinary tract infection without hematuria, site unspecified

## 2021-01-23 LAB — COMPREHENSIVE METABOLIC PANEL
ALT: 20 IU/L (ref 0–32)
AST: 28 IU/L (ref 0–40)
Albumin/Globulin Ratio: 1.6 (ref 1.2–2.2)
Albumin: 4.3 g/dL (ref 3.8–4.8)
Alkaline Phosphatase: 116 IU/L (ref 44–121)
BUN/Creatinine Ratio: 15 (ref 12–28)
BUN: 12 mg/dL (ref 8–27)
Bilirubin Total: 0.5 mg/dL (ref 0.0–1.2)
CO2: 21 mmol/L (ref 20–29)
Calcium: 9.6 mg/dL (ref 8.7–10.3)
Chloride: 102 mmol/L (ref 96–106)
Creatinine, Ser: 0.82 mg/dL (ref 0.57–1.00)
Globulin, Total: 2.7 g/dL (ref 1.5–4.5)
Glucose: 98 mg/dL (ref 65–99)
Potassium: 4.5 mmol/L (ref 3.5–5.2)
Sodium: 140 mmol/L (ref 134–144)
Total Protein: 7 g/dL (ref 6.0–8.5)
eGFR: 78 mL/min/{1.73_m2} (ref >59)

## 2021-01-23 LAB — CBC WITH DIFFERENTIAL/PLATELET
Basophils Absolute: 0.1 x10E3/uL (ref 0.0–0.2)
Basos: 1 %
EOS (ABSOLUTE): 0.4 x10E3/uL (ref 0.0–0.4)
Eos: 7 %
Hematocrit: 40.2 % (ref 34.0–46.6)
Hemoglobin: 13 g/dL (ref 11.1–15.9)
Immature Grans (Abs): 0 x10E3/uL (ref 0.0–0.1)
Immature Granulocytes: 0 %
Lymphocytes Absolute: 1.6 x10E3/uL (ref 0.7–3.1)
Lymphs: 30 %
MCH: 29 pg (ref 26.6–33.0)
MCHC: 32.3 g/dL (ref 31.5–35.7)
MCV: 90 fL (ref 79–97)
Monocytes Absolute: 0.6 x10E3/uL (ref 0.1–0.9)
Monocytes: 12 %
Neutrophils Absolute: 2.6 x10E3/uL (ref 1.4–7.0)
Neutrophils: 50 %
Platelets: 220 x10E3/uL (ref 150–450)
RBC: 4.48 x10E6/uL (ref 3.77–5.28)
RDW: 12.6 % (ref 11.7–15.4)
WBC: 5.3 x10E3/uL (ref 3.4–10.8)

## 2021-01-23 LAB — LIPID PANEL W/O CHOL/HDL RATIO
Cholesterol, Total: 213 mg/dL — ABNORMAL HIGH (ref 100–199)
HDL: 46 mg/dL (ref >39)
LDL Chol Calc (NIH): 137 mg/dL — ABNORMAL HIGH (ref 0–99)
Triglycerides: 170 mg/dL — ABNORMAL HIGH (ref 0–149)
VLDL Cholesterol Cal: 30 mg/dL (ref 5–40)

## 2021-01-25 NOTE — Progress Notes (Signed)
TSH did not result- please see if still pending.  CBC, CMP within normal limits.  Total cholesterol and LDL elevated.  Discuss lifestyle modification with patient e.g. increase exercise, fiber, fruits, vegetables, lean meat, and omega 3/fish intake and decrease saturated fat.  If patient following strict diet and exercise program already please schedule follow up appointment with primary care physician

## 2021-01-29 ENCOUNTER — Other Ambulatory Visit: Payer: Self-pay | Admitting: Adult Health

## 2021-01-29 DIAGNOSIS — E039 Hypothyroidism, unspecified: Secondary | ICD-10-CM

## 2021-01-29 LAB — TSH: TSH: 0.676 u[IU]/mL (ref 0.450–4.500)

## 2021-01-29 LAB — SPECIMEN STATUS REPORT

## 2021-01-29 MED ORDER — LEVOTHYROXINE SODIUM 75 MCG PO TABS: 75.0000 ug | ORAL_TABLET | Freq: Every day | ORAL | 1 refills | Status: DC

## 2021-01-29 NOTE — Progress Notes (Signed)
TSH for thyroid ok but low end normal would recommend decreasing synthroid dosage to 75 mcg by mouth daily and rechecking TSH in 3 months.

## 2021-01-29 NOTE — Progress Notes (Signed)
Meds ordered this encounter  Medications  . levothyroxine (SYNTHROID) 75 MCG tablet    Sig: Take 1 tablet (75 mcg total) by mouth daily. Recheck lab in 3 months.    Dispense:  90 tablet    Refill:  1   Orders Placed This Encounter  Procedures  . TSH

## 2021-03-15 ENCOUNTER — Ambulatory Visit: Payer: 59 | Admitting: Adult Health

## 2021-03-15 ENCOUNTER — Encounter: Payer: Self-pay | Admitting: Adult Health

## 2021-03-15 ENCOUNTER — Other Ambulatory Visit: Payer: Self-pay

## 2021-03-15 VITALS — BP 110/70 | HR 72 | Temp 97.6°F | Ht 64.02 in | Wt 202.6 lb

## 2021-03-15 DIAGNOSIS — E785 Hyperlipidemia, unspecified: Secondary | ICD-10-CM | POA: Diagnosis not present

## 2021-03-15 DIAGNOSIS — E039 Hypothyroidism, unspecified: Secondary | ICD-10-CM

## 2021-03-15 NOTE — Progress Notes (Signed)
New Patient Office Visit  Subjective:  Patient ID: Karla Turner, female    DOB: 1950/11/18  Age: 70 y.o. MRN: [id number]  CC:  Chief Complaint  Patient presents with  . Follow-up    Pt would like to discuss thyroid medication. Medication brand has changed with recent adjustment. Wants to go back to other brand of Medicine.     HPI Karla Turner presents for follow up on her history of pneumonia. She is feeling better and also had gallbladder removed at the time.  She is feeling much better and is starting to walk and exercise with her son.   She started taking Tirosin that she has been on for many years instead of taking the Synthroid that was sent in. So she has been taking her Tirosin 88 mcg every day except for one. She feels well otherwise.  She did not start the synthroid.   Patient  denies any fever, body aches,chills, rash, chest pain, shortness of breath, nausea, vomiting, or diarrhea.  Denies dizziness, lightheadedness, pre syncopal or syncopal episodes. '   Past Medical History:  Diagnosis Date  . Arthritis   . Asthma   . Bronchitis   . Cataracts, bilateral   . Colon polyps   . Depression   . Diverticulitis   . GERD (gastroesophageal reflux disease)   . Glaucoma   . Hyperlipidemia   . Hypothyroidism   . Lumbar herniated disc   . Osteoporosis     Past Surgical History:  Procedure Laterality Date  . BIOPSY BREAST  2019  . BREAST SURGERY    . cataract surgery Bilateral   . COLONOSCOPY    . COLONOSCOPY WITH PROPOFOL N/A 01/29/2018   Procedure: COLONOSCOPY WITH PROPOFOL;  Surgeon: Manya Silvas, MD;  Location: [facility] ENDOSCOPY;  Service: Endoscopy;  Laterality: N/A;  . DILATION AND CURETTAGE OF UTERUS    . ESOPHAGOGASTRODUODENOSCOPY (EGD) WITH PROPOFOL N/A 01/29/2018   Procedure: ESOPHAGOGASTRODUODENOSCOPY (EGD) WITH PROPOFOL;  Surgeon: Manya Silvas, MD;  Location: [facility] ENDOSCOPY;  Service: Endoscopy;  Laterality: N/A;  . EYE SURGERY    . mamoplasty  Bilateral     Family History  Problem Relation Age of Onset  . Breast cancer Sister   . Fibromyalgia Sister   . Allergies Son   . Asthma Son        as a child   . Cancer Maternal Grandmother   . Heart attack Maternal Grandfather     Social History   Socioeconomic History  . Marital status: Married    Spouse name: Not on file  . Number of children: Not on file  . Years of education: Not on file  . Highest education level: Not on file  Occupational History  . Not on file  Tobacco Use  . Smoking status: Never Smoker  . Smokeless tobacco: Never Used  Vaping Use  . Vaping Use: Never used  Substance and Sexual Activity  . Alcohol use: Yes    Comment: occasionally  . Drug use: No  . Sexual activity: Yes  Other Topics Concern  . Not on file  Social History Narrative   Lives at home with family   Social Determinants of Health   Financial Resource Strain: Not on file  Food Insecurity: Not on file  Transportation Needs: Not on file  Physical Activity: Not on file  Stress: Not on file  Social Connections: Not on file  Intimate Partner Violence: Not on file    ROS Review of  Systems  Constitutional: Negative.   Respiratory: Negative.   Cardiovascular: Negative.   Gastrointestinal: Negative.   Musculoskeletal: Negative.   Psychiatric/Behavioral: Negative.     Objective:   Today's Vitals: BP 110/70 (BP Location: Left Arm, Patient Position: Sitting)   Pulse 72   Temp 97.6 F (36.4 C)   Ht 5' 4.02" (1.626 m)   Wt 202 lb 9.6 oz (91.9 kg)   SpO2 97%   BMI 34.76 kg/m   Physical Exam Vitals reviewed.  Constitutional:      General: She is not in acute distress.    Appearance: Normal appearance. She is not toxic-appearing or diaphoretic.  HENT:     Head: Normocephalic and atraumatic.     Right Ear: External ear normal.     Left Ear: External ear normal.     Nose: Nose normal.  Eyes:     Extraocular Movements: Extraocular movements intact.  Cardiovascular:      Rate and Rhythm: Normal rate and regular rhythm.     Pulses: Normal pulses.     Heart sounds: Normal heart sounds. No murmur heard. No friction rub. No gallop.   Pulmonary:     Effort: Pulmonary effort is normal.     Breath sounds: Normal breath sounds. No wheezing, rhonchi or rales.  Chest:     Chest wall: No tenderness.  Abdominal:     Palpations: Abdomen is soft.  Musculoskeletal:        General: Normal range of motion.     Cervical back: Normal range of motion and neck supple.  Skin:    General: Skin is warm.     Findings: No erythema or rash.  Neurological:     General: No focal deficit present.     Mental Status: She is alert and oriented to person, place, and time.  Psychiatric:        Mood and Affect: Mood normal.        Behavior: Behavior normal.        Thought Content: Thought content normal.        Judgment: Judgment normal.     Assessment & Plan:   Problem List Items Addressed This Visit   None   Visit Diagnoses    Hyperlipidemia, unspecified hyperlipidemia type    -  Primary   Relevant Orders   Lipid Panel w/o Chol/HDL Ratio   Hypothyroidism, unspecified type       Relevant Orders   TSH    Continue diet and exercise lifestyle changes.   Continue Tirosin as taking. Will recheck TSH in 3 months as well as lipid panel.   Outpatient Encounter Medications as of 03/15/2021  Medication Sig  . acetaminophen (TYLENOL) 500 MG tablet Take by mouth.  Marland Kitchen albuterol (VENTOLIN HFA) 108 (90 Base) MCG/ACT inhaler Inhale 2 puffs into the lungs every 6 (six) hours as needed for wheezing or shortness of breath.  Marland Kitchen BREO ELLIPTA 100-25 MCG/INH AEPB INHALE ONE DOSE BY MOUTH DAILY  . brinzolamide (AZOPT) 1 % ophthalmic suspension Place 1 drop into the right eye 3 (three) times daily.  . budesonide (PULMICORT) 0.25 MG/2ML nebulizer solution TAKE 1 VIAL BY NEBULIZATION TWO TIMES A DAY AS NEEDED  . methazolamide (NEPTAZANE) 25 MG tablet Take 25 mg by mouth 3 (three) times  daily.  . montelukast (SINGULAIR) 10 MG tablet TAKE ONE TABLET BY MOUTH AT BEDTIME  . omeprazole (PRILOSEC) 20 MG capsule Take 1 capsule (20 mg total) by mouth daily.  . cephALEXin (KEFLEX) 500 MG  capsule Take 1 capsule (500 mg total) by mouth 3 (three) times daily. (Patient not taking: Reported on 03/15/2021)  . ondansetron (ZOFRAN) 4 MG tablet Take 1 tablet (4 mg total) by mouth every 8 (eight) hours as needed for nausea or vomiting. (Patient not taking: Reported on 03/15/2021)  . Semaglutide,0.25 or 0.5MG /DOS, 2 MG/1.5ML SOPN Inject into the skin. (Patient not taking: Reported on 03/15/2021)  . [DISCONTINUED] levothyroxine (SYNTHROID) 75 MCG tablet Take 1 tablet (75 mcg total) by mouth daily. Recheck lab in 3 months. (Patient not taking: Reported on 03/15/2021)   No facility-administered encounter medications on file as of 03/15/2021.     Return precautions given. Red Flags discussed. The patient was given clear instructions to go to ER or return to medical center if any red flags develop, symptoms do not improve, worsen or new problems develop. They verbalized understanding.    Risks, benefits, and alternatives of the medications and treatment plan prescribed today were discussed, and patient expressed understanding.    Education regarding symptom management and diagnosis given to patient on AVS.  Patient was in agreement with treatment plan.   Continue to follow with  Kelby Aline. Ariellah Faust AGNP-C, FNP-C for routine health maintenance.   Kelby Aline. Yasuko Lapage AGNP-C, FNP-C  Follow-up: Return in about 3 months (around 06/15/2021), or if symptoms worsen or fail to improve, for at any time for any worsening symptoms, Go to Emergency room/ urgent care if worse.   Marcille Buffy, FNP

## 2021-03-15 NOTE — Patient Instructions (Addendum)
Mediterranean Diet A Mediterranean diet refers to food and lifestyle choices that are based on the traditions of countries located on the [place]. This way of eating has been shown to help prevent certain conditions and improve outcomes for people who have chronic diseases, like kidney disease and heart disease. What are tips for following this plan? Lifestyle  Cook and eat meals together with your family, when possible.  Drink enough fluid to keep your urine clear or pale yellow.  Be physically active every day. This includes: ? Aerobic exercise like running or swimming. ? Leisure activities like gardening, walking, or housework.  Get 7-8 hours of sleep each night.  If recommended by your health care provider, drink red wine in moderation. This means 1 glass a day for nonpregnant women and 2 glasses a day for men. A glass of wine equals 5 oz (150 mL). Reading food labels  Check the serving size of packaged foods. For foods such as rice and pasta, the serving size refers to the amount of cooked product, not dry.  Check the total fat in packaged foods. Avoid foods that have saturated fat or trans fats.  Check the ingredients list for added sugars, such as corn syrup.   Shopping  At the grocery store, buy most of your food from the areas near the walls of the store. This includes: ? Fresh fruits and vegetables (produce). ? Grains, beans, nuts, and seeds. Some of these may be available in unpackaged forms or large amounts (in bulk). ? Fresh seafood. ? Poultry and eggs. ? Low-fat dairy products.  Buy whole ingredients instead of prepackaged foods.  Buy fresh fruits and vegetables in-season from local farmers markets.  Buy frozen fruits and vegetables in resealable bags.  If you do not have access to quality fresh seafood, buy precooked frozen shrimp or canned fish, such as tuna, salmon, or sardines.  Buy small amounts of raw or cooked vegetables, salads, or olives from  the deli or salad bar at your store.  Stock your pantry so you always have certain foods on hand, such as olive oil, canned tuna, canned tomatoes, rice, pasta, and beans. Cooking  Cook foods with extra-virgin olive oil instead of using butter or other vegetable oils.  Have meat as a side dish, and have vegetables or grains as your main dish. This means having meat in small portions or adding small amounts of meat to foods like pasta or stew.  Use beans or vegetables instead of meat in common dishes like chili or lasagna.  Experiment with different cooking methods. Try roasting or broiling vegetables instead of steaming or sauteing them.  Add frozen vegetables to soups, stews, pasta, or rice.  Add nuts or seeds for added healthy fat at each meal. You can add these to yogurt, salads, or vegetable dishes.  Marinate fish or vegetables using olive oil, lemon juice, garlic, and fresh herbs. Meal planning  Plan to eat 1 vegetarian meal one day each week. Try to work up to 2 vegetarian meals, if possible.  Eat seafood 2 or more times a week.  Have healthy snacks readily available, such as: ? Vegetable sticks with hummus. ? Greek yogurt. ? Fruit and nut trail mix.  Eat balanced meals throughout the week. This includes: ? Fruit: 2-3 servings a day ? Vegetables: 4-5 servings a day ? Low-fat dairy: 2 servings a day ? Fish, poultry, or lean meat: 1 serving a day ? Beans and legumes: 2 or more servings a week ?   Nuts and seeds: 1-2 servings a day ? Whole grains: 6-8 servings a day ? Extra-virgin olive oil: 3-4 servings a day  Limit red meat and sweets to only a few servings a month   What are my food choices?  Mediterranean diet ? Recommended  Grains: Whole-grain pasta. Brown rice. Bulgar wheat. Polenta. Couscous. Whole-wheat bread. [place].  Vegetables: Artichokes. Beets. Broccoli. Cabbage. Carrots. Eggplant. Green beans. Chard. Kale. Spinach. Onions. Leeks. Peas. Squash.  Tomatoes. Peppers. Radishes.  Fruits: Apples. Apricots. Avocado. Berries. Bananas. Cherries. Dates. Figs. Grapes. Lemons. Melon. Oranges. Peaches. Plums. Pomegranate.  Meats and other protein foods: Beans. Almonds. Sunflower seeds. Pine nuts. Peanuts. [place]. Salmon. Scallops. Shrimp. [place]. Tilapia. Clams. Oysters. Eggs.  Dairy: Low-fat milk. Cheese. Greek yogurt.  Beverages: Water. Red wine. Herbal tea.  Fats and oils: Extra virgin olive oil. Avocado oil. Grape seed oil.  Sweets and desserts: [place] yogurt with honey. Baked apples. Poached pears. Trail mix.  Seasoning and other foods: Basil. Cilantro. Coriander. Cumin. Mint. Parsley. Sage. Rosemary. Tarragon. Garlic. Oregano. Thyme. Pepper. Balsalmic vinegar. Tahini. Hummus. Tomato sauce. Olives. Mushrooms. ? Limit these  Grains: Prepackaged pasta or rice dishes. Prepackaged cereal with added sugar.  Vegetables: Deep fried potatoes (french fries).  Fruits: Fruit canned in syrup.  Meats and other protein foods: Beef. Pork. Lamb. Poultry with skin. Hot dogs. [place] Salines.  Dairy: Ice cream. Sour cream. Whole milk.  Beverages: Juice. Sugar-sweetened soft drinks. Beer. Liquor and spirits.  Fats and oils: Butter. Canola oil. Vegetable oil. Beef fat (tallow). Lard.  Sweets and desserts: Cookies. Cakes. Pies. Candy.  Seasoning and other foods: Mayonnaise. Premade sauces and marinades. The items listed may not be a complete list. Talk with your dietitian about what dietary choices are right for you. Summary  The Mediterranean diet includes both food and lifestyle choices.  Eat a variety of fresh fruits and vegetables, beans, nuts, seeds, and whole grains.  Limit the amount of red meat and sweets that you eat.  Talk with your health care provider about whether it is safe for you to drink red wine in moderation. This means 1 glass a day for nonpregnant women and 2 glasses a day for men. A glass of wine equals 5 oz (150 mL). This information  is not intended to replace advice given to you by your health care provider. Make sure you discuss any questions you have with your health care provider. Document Revised: 06/23/2016 Document Reviewed: 06/16/2016 Elsevier Patient Education  [place]. Hypothyroidism  Hypothyroidism is when the thyroid gland does not make enough of certain hormones (it is underactive). The thyroid gland is a small gland located in the lower front part of the neck, just in front of the windpipe (trachea). This gland makes hormones that help control how the body uses food for energy (metabolism) as well as how the heart and brain function. These hormones also play a role in keeping your bones strong. When the thyroid is underactive, it produces too little of the hormones thyroxine (T4) and triiodothyronine (T3). What are the causes? This condition may be caused by:  Hashimoto's disease. This is a disease in which the body's disease-fighting system (immune system) attacks the thyroid gland. This is the most common cause.  Viral infections.  Pregnancy.  Certain medicines.  Birth defects.  Past radiation treatments to the head or neck for cancer.  Past treatment with radioactive iodine.  Past exposure to radiation in the environment.  Past surgical removal of part or all of  the thyroid.  Problems with a gland in the center of the brain (pituitary gland).  Lack of enough iodine in the diet. What increases the risk? You are more likely to develop this condition if:  You are female.  You have a family history of thyroid conditions.  You use a medicine called lithium.  You take medicines that affect the immune system (immunosuppressants). What are the signs or symptoms? Symptoms of this condition include:  Feeling as though you have no energy (lethargy).  Not being able to tolerate cold.  Weight gain that is not explained by a change in diet or exercise habits.  Lack of  appetite.  Dry skin.  Coarse hair.  Menstrual irregularity.  Slowing of thought processes.  Constipation.  Sadness or depression. How is this diagnosed? This condition may be diagnosed based on:  Your symptoms, your medical history, and a physical exam.  Blood tests. You may also have imaging tests, such as an ultrasound or MRI. How is this treated? This condition is treated with medicine that replaces the thyroid hormones that your body does not make. After you begin treatment, it may take several weeks for symptoms to go away. Follow these instructions at home:  Take over-the-counter and prescription medicines only as told by your health care provider.  If you start taking any new medicines, tell your health care provider.  Keep all follow-up visits as told by your health care provider. This is important. ? As your condition improves, your dosage of thyroid hormone medicine may change. ? You will need to have blood tests regularly so that your health care provider can monitor your condition. Contact a health care provider if:  Your symptoms do not get better with treatment.  You are taking thyroid hormone replacement medicine and you: ? Sweat a lot. ? Have tremors. ? Feel anxious. ? Lose weight rapidly. ? Cannot tolerate heat. ? Have emotional swings. ? Have diarrhea. ? Feel weak. Get help right away if you have:  Chest pain.  An irregular heartbeat.  A rapid heartbeat.  Difficulty breathing. Summary  Hypothyroidism is when the thyroid gland does not make enough of certain hormones (it is underactive).  When the thyroid is underactive, it produces too little of the hormones thyroxine (T4) and triiodothyronine (T3).  The most common cause is Hashimoto's disease, a disease in which the body's disease-fighting system (immune system) attacks the thyroid gland. The condition can also be caused by viral infections, medicine, pregnancy, or past radiation  treatment to the head or neck.  Symptoms may include weight gain, dry skin, constipation, feeling as though you do not have energy, and not being able to tolerate cold.  This condition is treated with medicine to replace the thyroid hormones that your body does not make. This information is not intended to replace advice given to you by your health care provider. Make sure you discuss any questions you have with your health care provider. Document Revised: 07/24/2020 Document Reviewed: 07/09/2020 Elsevier Patient Education  2021 [employer].

## 2021-04-06 ENCOUNTER — Other Ambulatory Visit: Payer: Self-pay | Admitting: Primary Care

## 2021-04-06 DIAGNOSIS — J302 Other seasonal allergic rhinitis: Secondary | ICD-10-CM

## 2021-04-06 DIAGNOSIS — J4531 Mild persistent asthma with (acute) exacerbation: Secondary | ICD-10-CM

## 2021-04-20 ENCOUNTER — Other Ambulatory Visit: Payer: Self-pay | Admitting: Adult Health

## 2021-04-27 ENCOUNTER — Telehealth: Payer: Self-pay | Admitting: Adult Health

## 2021-04-27 NOTE — Telephone Encounter (Signed)
Patient called and is requesting a refill on her Tirosint 36mcg.

## 2021-04-28 ENCOUNTER — Other Ambulatory Visit: Payer: Self-pay

## 2021-04-28 MED ORDER — LEVOTHYROXINE SODIUM 100 MCG PO CAPS: ORAL_CAPSULE | ORAL | 0 refills | Status: DC

## 2021-04-28 NOTE — Telephone Encounter (Signed)
Notes from Dr Gabriel Carina indicate that her dose is 100 mcg daily and that is the dose that is in her "historical" medical chart.  So that is what I am refilling

## 2021-04-28 NOTE — Telephone Encounter (Signed)
New refill for pt has been sent for Tirosint. Note to pharmacy to be prescribed brand name only.

## 2021-04-28 NOTE — Telephone Encounter (Signed)
PA has been sent Key: [id number]

## 2021-04-28 NOTE — Telephone Encounter (Signed)
VM box full

## 2021-04-28 NOTE — Telephone Encounter (Signed)
Pt called in and stated she had a message from her pharmacy that said her medication was ready but states its the wrong medication . Pt states she takes Tirosint and the message said levothyroxine. I informed the pt that the Tirosint was the brand name and Levothyroxine sodium is the generic. Pt states that she has to have the brand name medication Tirosint or the medication wont work. Pt stated she will call back if medication is wrong at the pharmacy.

## 2021-04-28 NOTE — Telephone Encounter (Signed)
Called pt to clarify medication. Pt states she been taking Tirosent for almost 10 years and has been taking it at 88 mcg 6 days a week.pt states she has about 8 pills left and would like a refill that will last her til her next appt. Pt says she sees [facility] and will be getting labs done with them in August.Pt states she is contacting our office regarding medication because Sharyn Lull was the one who changed her thyroid medication.

## 2021-04-28 NOTE — Telephone Encounter (Signed)
Pt called office back to let us know she changed insurance so she'll need a PA to get her Tirosint.

## 2021-04-28 NOTE — Telephone Encounter (Signed)
Pt called back stating she saw PA for medication was approved but states she has been taking the 88 mcg dose the last few years. I informed the pt that it was ordered that way previously so that's how provider prescribed it. Pt stated again she has been taking 88 mcg the last few years and that has worked for her.I informed pt I would let provider know. Please advise.

## 2021-04-28 NOTE — Addendum Note (Signed)
Addended by: Crecencio Mc on: 04/28/2021 12:27 PM   Modules accepted: Orders

## 2021-04-29 ENCOUNTER — Other Ambulatory Visit: Payer: Self-pay | Admitting: Internal Medicine

## 2021-04-30 NOTE — Telephone Encounter (Signed)
PT called to return the missed call 

## 2021-04-30 NOTE — Telephone Encounter (Signed)
No vm left. Vm box full

## 2021-04-30 NOTE — Telephone Encounter (Signed)
Contacting pt to see who prescribed her Levothyroxine 88 mcg. No vm left . Vm box full

## 2021-05-09 ENCOUNTER — Telehealth: Payer: Self-pay | Admitting: Internal Medicine

## 2021-05-09 MED ORDER — PREDNISONE 10 MG PO TABS: ORAL_TABLET | ORAL | 0 refills | Status: DC

## 2021-05-09 MED ORDER — AZITHROMYCIN 250 MG PO TABS: ORAL_TABLET | ORAL | 0 refills | Status: DC

## 2021-05-09 NOTE — Telephone Encounter (Signed)
Pt vax x 3 for covid  Acute onset low grade fever/water nose/scratchy throat/ minimal cough. Tested pos this am.  If get worse: Zpak  Prednisone 10 mg take  4 each am x 2 days,   2 each am x 2 days,  1 each am x 2 days and stop

## 2021-06-14 ENCOUNTER — Other Ambulatory Visit (INDEPENDENT_AMBULATORY_CARE_PROVIDER_SITE_OTHER): Payer: 59

## 2021-06-14 ENCOUNTER — Other Ambulatory Visit: Payer: Self-pay

## 2021-06-14 DIAGNOSIS — E785 Hyperlipidemia, unspecified: Secondary | ICD-10-CM

## 2021-06-15 LAB — LIPID PANEL W/O CHOL/HDL RATIO
Cholesterol, Total: 240 mg/dL — ABNORMAL HIGH (ref 100–199)
HDL: 63 mg/dL (ref >39)
LDL Chol Calc (NIH): 154 mg/dL — ABNORMAL HIGH (ref 0–99)
Triglycerides: 128 mg/dL (ref 0–149)
VLDL Cholesterol Cal: 23 mg/dL (ref 5–40)

## 2021-06-16 ENCOUNTER — Ambulatory Visit: Payer: 59 | Admitting: Adult Health

## 2021-07-13 ENCOUNTER — Telehealth: Payer: Self-pay

## 2021-07-13 NOTE — Telephone Encounter (Signed)
Pt has an appointment scheduled for 07/14/21 with Debbrah Alar of [place] a farm 9 days ago and now has bumps along arms and legs. Pt fears lyme disease from possible tick bites.

## 2021-07-13 NOTE — Telephone Encounter (Signed)
Patient was seen by a dermatologist this morning and they didn't see anything latched or noticeable. Patient is just concerned about the bites due to going out of country for a month in two weeks. Patient is not having any other sx but the tick/bug bites. Patient is very fearful of lyme disease.

## 2021-07-13 NOTE — Telephone Encounter (Signed)
Reviewed. Appt scheduled.  Need to see if any other symptoms.  If any headache, fever, body aches - would recommend to go ahead and be seen and not wait for appt.

## 2021-07-14 ENCOUNTER — Telehealth (INDEPENDENT_AMBULATORY_CARE_PROVIDER_SITE_OTHER): Payer: 59 | Admitting: Family

## 2021-07-14 ENCOUNTER — Encounter: Payer: Self-pay | Admitting: Family

## 2021-07-14 ENCOUNTER — Other Ambulatory Visit: Payer: Self-pay

## 2021-07-14 DIAGNOSIS — S80869A Insect bite (nonvenomous), unspecified lower leg, initial encounter: Secondary | ICD-10-CM

## 2021-07-14 DIAGNOSIS — R350 Frequency of micturition: Secondary | ICD-10-CM | POA: Diagnosis not present

## 2021-07-14 DIAGNOSIS — R197 Diarrhea, unspecified: Secondary | ICD-10-CM | POA: Diagnosis not present

## 2021-07-14 DIAGNOSIS — W57XXXA Bitten or stung by nonvenomous insect and other nonvenomous arthropods, initial encounter: Secondary | ICD-10-CM | POA: Diagnosis not present

## 2021-07-14 MED ORDER — DOXYCYCLINE HYCLATE 100 MG PO TABS: 100.0000 mg | ORAL_TABLET | Freq: Two times a day (BID) | ORAL | 0 refills | Status: DC

## 2021-07-14 NOTE — Progress Notes (Signed)
MyChart Video Visit    Virtual Visit via Video Note   This visit type was conducted due to national recommendations for restrictions regarding the COVID-19 Pandemic (e.g. social distancing) in an effort to limit this patient's exposure and mitigate transmission in our community. This patient is at least at moderate risk for complications without adequate follow up. This format is felt to be most appropriate for this patient at this time. Physical exam was limited by quality of the video and audio technology used for the visit. CMA was able to get the patient set up on a video visit.  Patient location: Home. Patient and provider in visit Provider location: Office  I discussed the limitations of evaluation and management by telemedicine and the availability of in person appointments. The patient expressed understanding and agreed to proceed.  Visit Date: 07/14/2021  Today's healthcare provider: Nance Pear, NP     Subjective:    Patient ID: Karla Turner, female    DOB: 09-05-1951, 69 y.o.   MRN: [id number]  Chief Complaint  Patient presents with   Insect Bite    Patient reports she was advised by dermatology that she has "nymph tick bites"   Skin lesions    Patient complains of having "bumps all over body"   Diarrhea    Complains of intermittent diarrhea    HPI  Patient presents today with several concerns.   Tick Bites (nymph)- had been to a farm and then noticed all these bumps a few days later.  Then symptoms worsened with increased itching.  She first noticed 1 week ago.    She was prescribed triamcinolone acetonide ointment by dermatology. 0.1%. She used it yesterday and it did not help much.    She reports that she has had increased diarrhea for the last few months. Stool is like "water" after eating.  She had cholecystectomy in march 2022.  She notices more with salads.    Past Medical History:  Diagnosis Date   Arthritis    Asthma    Bronchitis     Cataracts, bilateral    Colon polyps    Depression    Diverticulitis    GERD (gastroesophageal reflux disease)    Glaucoma    Hyperlipidemia    Hypothyroidism    Lumbar herniated disc    Osteoporosis     Past Surgical History:  Procedure Laterality Date   BIOPSY BREAST  2019   BREAST SURGERY     cataract surgery Bilateral    COLONOSCOPY     COLONOSCOPY WITH PROPOFOL N/A 01/29/2018   Procedure: COLONOSCOPY WITH PROPOFOL;  Surgeon: Manya Silvas, MD;  [facility] END[facility]COPY;  Service: Endoscopy;  Laterality: N/A;   DILATION AND CURETTAGE OF UTERUS     ESOPHAGOGASTRODUODENOSCOPY (EGD) WITH PROPOFOL N/A 01/29/2018   Procedure: ESOPHAGOGASTRODUODENOSCOPY (EGD) WITH PROPOFOL;  Surgeon: Manya Silvas, MD;  Location: [facility] ENDOSCOPY;  Service: Endoscopy;  Laterality: N/A;   EYE SURGERY     mamoplasty Bilateral     Family History  Problem Relation Age of Onset   Breast cancer Sister    Fibromyalgia Sister    Allergies Son    Asthma Son        as a child    Cancer Maternal Grandmother    Heart attack Maternal Grandfather     Social History   Socioeconomic History   Marital status: Married    Spouse name: Not on file   Number of children: Not on file  Years of education: Not on file   Highest education level: Not on file  Occupational History   Not on file  Tobacco Use   Smoking status: Never   Smokeless tobacco: Never  Vaping Use   Vaping Use: Never used  Substance and Sexual Activity   Alcohol use: Yes    Comment: occasionally   Drug use: No   Sexual activity: Yes  Other Topics Concern   Not on file  Social History Narrative   Lives at home with family   Social Determinants of Health   Financial Resource Strain: Not on file  Food Insecurity: Not on file  Transportation Needs: Not on file  Physical Activity: Not on file  Stress: Not on file  Social Connections: Not on file  Intimate Partner Violence: Not on file    Outpatient Medications Prior  to Visit  Medication Sig Dispense Refill   albuterol (VENTOLIN HFA) 108 (90 Base) MCG/ACT inhaler Inhale 2 puffs into the lungs every 6 (six) hours as needed for wheezing or shortness of breath. 1 each 11   BREO ELLIPTA 100-25 MCG/INH AEPB INHALE ONE DOSE BY MOUTH DAILY 180 each 3   brinzolamide (AZOPT) 1 % ophthalmic suspension Place 1 drop into the right eye 3 (three) times daily.     budesonide (PULMICORT) 0.25 MG/2ML nebulizer solution TAKE 1 VIAL BY NEBULIZATION TWO TIMES A DAY AS NEEDED 60 mL 4   Levothyroxine Sodium (TIROSINT) 100 MCG CAPS TAKE ONE CAPSULE BY MOUTH EVERY MORNING ON AN EMPTY STOMACH 30 capsule 0   methazolamide (NEPTAZANE) 25 MG tablet Take 25 mg by mouth 3 (three) times daily.     montelukast (SINGULAIR) 10 MG tablet TAKE 1 TABLET BY MOUTH AT BEDTIME 90 tablet 2   omeprazole (PRILOSEC) 20 MG capsule Take 1 capsule (20 mg total) by mouth daily. 90 capsule 1   TIROSINT 88 MCG CAPS TAKE 1 CAPSULE BY MOUTH ONCE DAILY 90 capsule 0   acetaminophen (TYLENOL) 500 MG tablet Take by mouth.     azithromycin (ZITHROMAX) 250 MG tablet 2 on day on day one then one daily x 4 days 6 tablet 0   predniSONE (DELTASONE) 10 MG tablet Take  4 each am x 2 days,   2 each am x 2 days,  1 each am x 2 days and stop 14 tablet 0   No facility-administered medications prior to visit.    Allergies  Allergen Reactions   White Petrolatum     Other reaction(s): Other (See Comments) Pt states that she used Invisalign (which she states is a petroleum product and afterward developed systemic "poisoning."    ROS See HPI    Objective:    Physical Exam Constitutional:      Appearance: Normal appearance.  HENT:     Head: Normocephalic and atraumatic.  Neurological:     General: No focal deficit present.     Mental Status: She is alert and oriented to person, place, and time.  Psychiatric:        Mood and Affect: Mood normal.    There were no vitals taken for this visit. Wt Readings from  Last 3 Encounters:  03/15/21 202 lb 9.6 oz (91.9 kg)  01/20/21 201 lb 9.6 oz (91.4 kg)  01/14/21 206 lb 3.2 oz (93.5 kg)       Assessment & Plan:   Problem List Items Addressed This Visit   None   I have discontinued Karla Turner "Karla Turner"'s acetaminophen, azithromycin, and  predniSONE. I am also having her maintain her brinzolamide, methazolamide, budesonide, omeprazole, albuterol, Breo Ellipta, montelukast, Levothyroxine Sodium, and Tirosint.  No orders of the defined types were placed in this encounter.   I discussed the assessment and treatment plan with the patient. The patient was provided an opportunity to ask questions and all were answered. The patient agreed with the plan and demonstrated an understanding of the instructions.   The patient was advised to call back or seek an in-person evaluation if the symptoms worsen or if the condition fails to improve as anticipated.    Nance Pear, NP [employer] [facility] [employer] [phone] (phone) [phone] (fax)  [place]

## 2021-07-15 ENCOUNTER — Telehealth: Payer: Self-pay | Admitting: Family

## 2021-07-15 DIAGNOSIS — S80869A Insect bite (nonvenomous), unspecified lower leg, initial encounter: Secondary | ICD-10-CM | POA: Insufficient documentation

## 2021-07-15 DIAGNOSIS — W57XXXA Bitten or stung by nonvenomous insect and other nonvenomous arthropods, initial encounter: Secondary | ICD-10-CM | POA: Insufficient documentation

## 2021-07-15 DIAGNOSIS — R350 Frequency of micturition: Secondary | ICD-10-CM | POA: Insufficient documentation

## 2021-07-15 NOTE — Assessment & Plan Note (Signed)
Pt will give a urine sample at her home clinic for culture.

## 2021-07-15 NOTE — Telephone Encounter (Signed)
See mychart.  

## 2021-07-15 NOTE — Telephone Encounter (Signed)
Can you please arrange a lab appointment for pt at [facility] for Friday 9/9?

## 2021-07-15 NOTE — Assessment & Plan Note (Signed)
Recommended GI profile. If symptoms persist and GI profile unremarkable, would plan GI referral.

## 2021-07-15 NOTE — Assessment & Plan Note (Signed)
I am not sure that these bites are due to ticks. Maybe ants (see photos pt sent). Recommended that she add claritin for itching and continue to apply prn triamcinolone ointment.

## 2021-07-16 ENCOUNTER — Other Ambulatory Visit: Payer: Self-pay

## 2021-07-16 ENCOUNTER — Ambulatory Visit (INDEPENDENT_AMBULATORY_CARE_PROVIDER_SITE_OTHER): Payer: 59

## 2021-07-16 DIAGNOSIS — Z23 Encounter for immunization: Secondary | ICD-10-CM | POA: Diagnosis not present

## 2021-07-16 NOTE — Telephone Encounter (Signed)
Per patient she can not go to the lab today, she was schedule for labs on Monday.

## 2021-07-19 ENCOUNTER — Other Ambulatory Visit: Payer: Self-pay

## 2021-07-19 ENCOUNTER — Other Ambulatory Visit (INDEPENDENT_AMBULATORY_CARE_PROVIDER_SITE_OTHER): Payer: 59

## 2021-07-19 DIAGNOSIS — R350 Frequency of micturition: Secondary | ICD-10-CM

## 2021-07-19 DIAGNOSIS — R197 Diarrhea, unspecified: Secondary | ICD-10-CM

## 2021-07-19 LAB — URINALYSIS, ROUTINE W REFLEX MICROSCOPIC
Bilirubin Urine: NEGATIVE
Hgb urine dipstick: NEGATIVE
Ketones, ur: NEGATIVE
Leukocytes,Ua: NEGATIVE
Nitrite: NEGATIVE
RBC / HPF: NONE SEEN (ref 0–?)
Specific Gravity, Urine: 1.01 (ref 1.000–1.030)
Total Protein, Urine: NEGATIVE
Urine Glucose: NEGATIVE
Urobilinogen, UA: 0.2 (ref 0.0–1.0)
pH: 7.5 (ref 5.0–8.0)

## 2021-07-20 LAB — URINE CULTURE
MICRO NUMBER:: 12360719
Result:: NO GROWTH
SPECIMEN QUALITY:: ADEQUATE

## 2021-07-21 LAB — GI PROFILE, STOOL, PCR

## 2021-07-26 ENCOUNTER — Encounter: Payer: Self-pay | Admitting: Emergency Medicine

## 2021-07-26 ENCOUNTER — Ambulatory Visit
Admission: EM | Admit: 2021-07-26 | Discharge: 2021-07-26 | Disposition: A | Discharge status: Home / Self Care | Payer: 59 | Attending: Emergency Medicine | Admitting: Emergency Medicine

## 2021-07-26 ENCOUNTER — Other Ambulatory Visit: Payer: Self-pay

## 2021-07-26 DIAGNOSIS — K121 Other forms of stomatitis: Secondary | ICD-10-CM | POA: Diagnosis not present

## 2021-07-26 MED ORDER — AMOXICILLIN 875 MG PO TABS: 875.0000 mg | ORAL_TABLET | Freq: Two times a day (BID) | ORAL | 0 refills | Status: AC

## 2021-07-26 NOTE — Discharge Instructions (Addendum)
Try salt water gargles several times a day.  If you are ulcer is not getting better, start the amoxicillin as directed.    Follow up with your primary care provider if your symptoms are not improving.

## 2021-07-26 NOTE — ED Triage Notes (Signed)
Reports sore in back of throat that she just noticed today. She can see an area where an ulcer may have burst. She is going out of the country on Friday.

## 2021-07-26 NOTE — ED Provider Notes (Signed)
Roderic Palau    CSN: [id number] Arrival date & time: 07/26/21  1553      History   Chief Complaint Chief Complaint  Patient presents with   Mouth Lesions    HPI Karla Turner is a 70 y.o. female.  Patient presents with an ulcer in the top of her mouth that she noticed today.  She states she believes the ulcer "burst" while she was eating lunch.  The area is mildly tender.  She denies difficulty swallowing or breathing.  No fever, chills, sore throat, cough, or other symptoms.  Treatment attempted at home with Listerine gargle.  Patient is going out of the country in 4 days.  Her medical history includes asthma.  The history is provided by the patient and medical records.   Past Medical History:  Diagnosis Date   Arthritis    Asthma    Bronchitis    Cataracts, bilateral    Colon polyps    Depression    Diverticulitis    GERD (gastroesophageal reflux disease)    Glaucoma    Hyperlipidemia    Hypothyroidism    Lumbar herniated disc    Osteoporosis     Patient Active Problem List   Diagnosis Date Noted   Urinary frequency 07/15/2021   Insect bite of lower leg 07/15/2021   Urinary tract infection without hematuria 01/22/2021   History of pneumonia 01/20/2021   History of cholecystectomy 01/20/2021   Cystitis 01/20/2021   Low TSH level 01/20/2021   Pure hypercholesterolemia 01/20/2021   Symptomatic cholelithiasis 12/25/2020   Diarrhea 12/24/2020   Viral gastroenteritis 12/24/2020   Nausea and vomiting 12/24/2020   Need for shingles vaccine 12/07/2020   Low back pain without sciatica 12/07/2020   Preventative health care 09/02/2020   Localized osteoporosis without current pathological fracture 09/02/2020   Hypothyroidism due to Hashimoto's thyroiditis 09/02/2020   IFG (impaired fasting glucose) 09/02/2020   Other hyperlipidemia 06/02/2020   Class 2 obesity 04/05/2020   Cyst of ovary 02/10/2020   Acquired hypothyroidism 01/20/2020   Glaucoma 01/20/2020    Uterine fibroid 01/20/2020   Bronchiectasis without complication ([place]) [id number]   History of diverticulitis 11/16/2018   Dyslipidemia 11/16/2018   Primary open angle glaucoma (POAG) of both eyes, mild stage 09/17/2018   Positive ANA (antinuclear antibody) 09/17/2018   Erosive gastritis 06/18/2018   Breast mass, right 05/30/2018   Shortness of breath 12/18/2017   Vitamin B12 deficiency anemia 12/18/2017   Asthma 11/17/2017   GERD (gastroesophageal reflux disease) 11/17/2017   Hx of adenomatous colonic polyps 11/17/2017   Body mass index (BMI) of 34.0-34.9 in adult 11/17/2017   Pelvic mass in female 01/10/2017   Varicose veins 07/24/2015   Glaucoma suspect of right eye 04/15/2014   Infection and inflammatory reaction due to other internal prosthetic devices, implants and grafts, initial encounter ([place]) 04/15/2014   Pseudophakia of both eyes 04/15/2014   Cystoid macular edema 03/18/2013   Epiretinal membrane 03/18/2013    Past Surgical History:  Procedure Laterality Date   BIOPSY BREAST  2019   BREAST SURGERY     cataract surgery Bilateral    CHOLECYSTECTOMY  01/2021   COLONOSCOPY     COLONOSCOPY WITH PROPOFOL N/A 01/29/2018   Procedure: COLONOSCOPY WITH PROPOFOL;  Surgeon: Manya Silvas, MD;  Location: [facility] ENDOSCOPY;  Service: Endoscopy;  Laterality: N/A;   DILATION AND CURETTAGE OF UTERUS     ESOPHAGOGASTRODUODENOSCOPY (EGD) WITH PROPOFOL N/A 01/29/2018   Procedure: ESOPHAGOGASTRODUODENOSCOPY (EGD) WITH PROPOFOL;  Surgeon:  Manya Silvas, MD;  Location: [facility] ENDOSCOPY;  Service: Endoscopy;  Laterality: N/A;   EYE SURGERY     mamoplasty Bilateral     OB History   No obstetric history on file.      Home Medications    Prior to Admission medications   Medication Sig Start Date End Date Taking? Authorizing Provider  amoxicillin (AMOXIL) 875 MG tablet Take 1 tablet (875 mg total) by mouth 2 (two) times daily for 7 days. 07/26/21 08/02/21 Yes Sharion Balloon, NP   albuterol (VENTOLIN HFA) 108 (90 Base) MCG/ACT inhaler Inhale 2 puffs into the lungs every 6 (six) hours as needed for wheezing or shortness of breath. 08/17/20   Hunsucker, Bonna Gains, MD  BREO ELLIPTA 100-25 MCG/INH AEPB INHALE ONE DOSE BY MOUTH DAILY 10/20/20   Hunsucker, Bonna Gains, MD  brinzolamide (AZOPT) 1 % ophthalmic suspension Place 1 drop into the right eye 3 (three) times daily.    [provider]  budesonide (PULMICORT) 0.25 MG/2ML nebulizer solution TAKE 1 VIAL BY NEBULIZATION TWO TIMES A DAY AS NEEDED 07/16/19   Guse, Jacquelynn Cree, FNP  doxycycline (VIBRA-TABS) 100 MG tablet Take 1 tablet (100 mg total) by mouth 2 (two) times daily. 07/14/21   Debbrah Alar, NP  Levothyroxine Sodium (TIROSINT) 100 MCG CAPS TAKE ONE CAPSULE BY MOUTH EVERY MORNING ON AN EMPTY STOMACH 04/28/21   Crecencio Mc, MD  methazolamide (NEPTAZANE) 25 MG tablet Take 25 mg by mouth 3 (three) times daily.    [provider]  montelukast (SINGULAIR) 10 MG tablet TAKE 1 TABLET BY MOUTH AT BEDTIME 04/06/21   [name], NP  omeprazole (PRILOSEC) 20 MG capsule Take 1 capsule (20 mg total) by mouth daily. 10/08/19   Leone Haven, MD  [place] 29 MCG CAPS TAKE 1 CAPSULE BY MOUTH ONCE DAILY 04/29/21   Flinchum, Kelby Aline, FNP    Family History Family History  Problem Relation Age of Onset   Breast cancer Sister    Fibromyalgia Sister    Allergies Son    Asthma Son        as a child    Cancer Maternal Grandmother    Heart attack Maternal Grandfather     Social History Social History   Tobacco Use   Smoking status: Never   Smokeless tobacco: Never  Vaping Use   Vaping Use: Never used  Substance Use Topics   Alcohol use: Yes    Comment: occasionally   Drug use: No     Allergies   White petrolatum   Review of Systems Review of Systems  Constitutional:  Negative for chills and fever.  HENT:  Negative for ear pain, sore throat, trouble swallowing and voice change.         Ulcer in mouth.  Respiratory:  Negative for cough and shortness of breath.   Cardiovascular:  Negative for chest pain and palpitations.  All other systems reviewed and are negative.   Physical Exam Triage Vital Signs ED Triage Vitals  Enc Vitals Group     BP 07/26/21 1603 129/85     Pulse Rate 07/26/21 1603 78     Resp 07/26/21 1603 18     Temp 07/26/21 1603 98.1 F (36.7 C)     Temp Source 07/26/21 1603 Oral     SpO2 07/26/21 1603 95 %     Weight --      Height --      Head Circumference --  Peak Flow --      Pain Score 07/26/21 1610 3     Pain Loc --      Pain Edu? --      Excl. in [place]? --    No data found.  Updated Vital Signs BP 129/85 (BP Location: Left Arm)   Pulse 78   Temp 98.1 F (36.7 C) (Oral)   Resp 18   SpO2 95%   Visual Acuity Right Eye Distance:   Left Eye Distance:   Bilateral Distance:    Right Eye Near:   Left Eye Near:    Bilateral Near:     Physical Exam Vitals and nursing note reviewed.  Constitutional:      General: She is not in acute distress.    Appearance: She is well-developed.  HENT:     Head: Normocephalic and atraumatic.     Right Ear: Tympanic membrane normal.     Left Ear: Tympanic membrane normal.     Nose: Nose normal.     Mouth/Throat:     Mouth: Mucous membranes are moist.     Comments: No difficulty swallowing.  Speech clear.  No oropharyngeal swelling.  Ulcer on palate. Eyes:     Conjunctiva/sclera: Conjunctivae normal.  Cardiovascular:     Rate and Rhythm: Normal rate and regular rhythm.     Heart sounds: No murmur heard. Pulmonary:     Effort: Pulmonary effort is normal. No respiratory distress.     Breath sounds: Normal breath sounds.  Abdominal:     Palpations: Abdomen is soft.     Tenderness: There is no abdominal tenderness.  Musculoskeletal:     Cervical back: Neck supple.  Skin:    General: Skin is warm and dry.  Neurological:     General: No focal deficit present.     Mental Status: She is  alert and oriented to person, place, and time.  Psychiatric:        Mood and Affect: Mood normal.        Behavior: Behavior normal.     UC Treatments / Results  Labs (all labs ordered are listed, but only abnormal results are displayed) Labs Reviewed - No data to display  EKG   Radiology No results found.  Procedures Procedures (including critical care time)  Medications Ordered in UC Medications - No data to display  Initial Impression / Assessment and Plan / UC Course  I have reviewed the triage vital signs and the nursing notes.  Pertinent labs & imaging results that were available during my care of the patient were reviewed by me and considered in my medical decision making (see chart for details).  Mouth ulcer.  Discussed symptomatic treatment including salt water gargles.  Discussed that if her symptoms are not improving, she can start taking amoxicillin which is prescribed for her today.  She is going out of the country in 4 days.  Discussed Tylenol or ibuprofen as needed for discomfort.  Instructed her to follow-up with her PCP as needed.  Final Clinical Impressions(s) / UC Diagnoses   Final diagnoses:  Mouth ulcer     Discharge Instructions      Try salt water gargles several times a day.  If you are ulcer is not getting better, start the amoxicillin as directed.    Follow up with your primary care provider if your symptoms are not improving.        ED Prescriptions     Medication Sig Dispense Auth. Provider  amoxicillin (AMOXIL) 875 MG tablet Take 1 tablet (875 mg total) by mouth 2 (two) times daily for 7 days. 14 tablet Sharion Balloon, NP      PDMP not reviewed this encounter.   Sharion Balloon, NP 07/26/21 1651

## 2021-08-30 ENCOUNTER — Encounter: Payer: 59 | Admitting: Adult Health

## 2021-09-14 ENCOUNTER — Encounter: Payer: 59 | Admitting: Adult Health

## 2021-09-25 ENCOUNTER — Other Ambulatory Visit: Payer: Self-pay | Admitting: Pulmonary Disease

## 2021-09-25 DIAGNOSIS — J4531 Mild persistent asthma with (acute) exacerbation: Secondary | ICD-10-CM

## 2021-10-11 ENCOUNTER — Other Ambulatory Visit: Payer: Self-pay

## 2021-10-11 ENCOUNTER — Ambulatory Visit (INDEPENDENT_AMBULATORY_CARE_PROVIDER_SITE_OTHER): Payer: 59 | Admitting: Family

## 2021-10-11 ENCOUNTER — Encounter: Payer: Self-pay | Admitting: Family

## 2021-10-11 VITALS — BP 122/78 | HR 71 | Temp 96.2°F | Ht 62.5 in | Wt 205.5 lb

## 2021-10-11 DIAGNOSIS — F320 Major depressive disorder, single episode, mild: Secondary | ICD-10-CM | POA: Diagnosis not present

## 2021-10-11 DIAGNOSIS — Z0000 Encounter for general adult medical examination without abnormal findings: Secondary | ICD-10-CM

## 2021-10-11 NOTE — Assessment & Plan Note (Signed)
Patient declines clinical breast exam.  Encouraged her to start performing monthly self breast exams.  Mammogram and bone density have been ordered.Patient will call to schedule these.  She politely declines pelvic exam in the absence of symptoms and she is no longer screening for cervical cancer.    Screening labs ordered.  Encouraged to remain active.

## 2021-10-11 NOTE — Progress Notes (Signed)
Subjective:    Patient ID: Karla Turner, female    DOB: 11-08-1950, 70 y.o.   MRN: [id number]  CC: [name] is a 70 y.o. female who presents today for physical exam.    HPI: Complains of depression x 6 months, comes and goes.  Tearful over daughter in law not allowing close relationship with her grandson who is 70 years old. Trouble sleeping. No si/hi. No depression when she took a recent trip out of the country.  Depression is situational.        Colorectal Cancer Screening: UTD  Breast Cancer Screening: Mammogram due  Cervical Cancer Screening: done 05/20/2020 at [facility].  Negative malignancy, HPV Bone Health screening/DEXA for 65+: due         Tetanus - UTD  Labs: Screening labs today. Exercise: Gets regular exercise with  house and yard work. No cp.  Alcohol use: occasional Smoking/tobacco use: Nonsmoker.     HISTORY:  Past Medical History:  Diagnosis Date   Arthritis    Asthma    Bronchitis    Cataracts, bilateral    Colon polyps    Depression    Diverticulitis    GERD (gastroesophageal reflux disease)    Glaucoma    Hyperlipidemia    Hypothyroidism    Lumbar herniated disc    Osteoporosis     Past Surgical History:  Procedure Laterality Date   BIOPSY BREAST  2019   BREAST SURGERY     cataract surgery Bilateral    CHOLECYSTECTOMY  01/2021   COLONOSCOPY     COLONOSCOPY WITH PROPOFOL N/A 01/29/2018   Procedure: COLONOSCOPY WITH PROPOFOL;  Surgeon: Manya Silvas, MD;  Location[facility] ENDOSCOPY;  Service: Endoscopy;  Laterality: N/A;   DILATION AND CURETTAGE OF UTERUS     ESOPHAGOGASTRODUODENOSCOPY (EGD) WITH PROPOFOL N/A 01/29/2018   Procedure: ESOPHAGOGASTRODUODENOSCOPY (EGD) WITH PROPOFOL;  Surgeon: Manya Silvas, MD;  Location[facility] ENDOSCOPY;  Service: Endoscopy;  Laterality: N/A;   EYE SURGERY     mamoplasty Bilateral    Family History  Problem Relation Age of Onset   Breast cancer Sister    Fibromyalgia Sister    Allergies Son     Asthma Son        as a child    Cancer Maternal Grandmother    Heart attack Maternal Grandfather       ALLERGIES: White petrolatum  Current Outpatient Medications on File Prior to Visit  Medication Sig Dispense Refill   albuterol (VENTOLIN HFA) 108 (90 Base) MCG/ACT inhaler Inhale 2 puffs into the lungs every 6 (six) hours as needed for wheezing or shortness of breath. 1 each 11   BREO ELLIPTA 100-25 MCG/ACT AEPB INHALE 1 DOSE BY MOUTH DAILY. 180 each 3   brinzolamide (AZOPT) 1 % ophthalmic suspension Place 1 drop into the right eye 3 (three) times daily.     budesonide (PULMICORT) 0.25 MG/2ML nebulizer solution TAKE 1 VIAL BY NEBULIZATION TWO TIMES A DAY AS NEEDED 60 mL 4   methazolamide (NEPTAZANE) 25 MG tablet Take 25 mg by mouth 3 (three) times daily.     montelukast (SINGULAIR) 10 MG tablet TAKE 1 TABLET BY MOUTH AT BEDTIME 90 tablet 2   omeprazole (PRILOSEC) 20 MG capsule Take 1 capsule (20 mg total) by mouth daily. 90 capsule 1   TIROSINT 88 MCG CAPS TAKE 1 CAPSULE BY MOUTH ONCE DAILY 90 capsule 0   No current facility-administered medications on file prior to visit.    Social  History   Tobacco Use   Smoking status: Never   Smokeless tobacco: Never  Vaping Use   Vaping Use: Never used  Substance Use Topics   Alcohol use: Yes    Comment: occasionally   Drug use: No    Review of Systems  Constitutional:  Negative for chills, fever and unexpected weight change.  HENT:  Negative for congestion.   Respiratory:  Negative for cough.   Cardiovascular:  Negative for chest pain, palpitations and leg swelling.  Gastrointestinal:  Negative for nausea and vomiting.  Genitourinary:  Negative for difficulty urinating, menstrual problem and pelvic pain.  Musculoskeletal:  Negative for arthralgias and myalgias.  Skin:  Negative for rash.  Neurological:  Negative for headaches.  Hematological:  Negative for adenopathy.  Psychiatric/Behavioral:  Negative for confusion.       Objective:    BP 122/78   Pulse 71   Temp (!) 96.2 F (35.7 C) (Temporal)   Ht 5' 2.5" (1.588 m)   Wt 205 lb 8 oz (93.2 kg)   SpO2 98%   BMI 36.99 kg/m   BP Readings from Last 3 Encounters:  10/11/21 122/78  07/26/21 129/85  03/15/21 110/70   Wt Readings from Last 3 Encounters:  10/11/21 205 lb 8 oz (93.2 kg)  03/15/21 202 lb 9.6 oz (91.9 kg)  01/20/21 201 lb 9.6 oz (91.4 kg)    Physical Exam Vitals reviewed.  Constitutional:      Appearance: She is well-developed.  Eyes:     Conjunctiva/sclera: Conjunctivae normal.  Neck:     Thyroid: No thyroid mass or thyromegaly.  Cardiovascular:     Rate and Rhythm: Normal rate and regular rhythm.     Pulses: Normal pulses.     Heart sounds: Normal heart sounds.  Pulmonary:     Effort: Pulmonary effort is normal.     Breath sounds: Normal breath sounds. No wheezing, rhonchi or rales.  Lymphadenopathy:     Head:     Right side of head: No submental, submandibular, tonsillar, preauricular, posterior auricular or occipital adenopathy.     Left side of head: No submental, submandibular, tonsillar, preauricular, posterior auricular or occipital adenopathy.     Cervical: No cervical adenopathy.  Skin:    General: Skin is warm and dry.  Neurological:     Mental Status: She is alert.  Psychiatric:        Speech: Speech normal.        Behavior: Behavior normal.        Thought Content: Thought content normal.       Assessment & Plan:   Problem List Items Addressed This Visit       Other   Depression, major, single episode, mild ([place])    Uncontrolled.  Situational as it relates to a strained relationship with grandson.  Referral for counseling.  Patient politely declines  medication at this time.  We can consider at follow-up.      Routine physical examination - Primary    Patient declines clinical breast exam.  Encouraged her to start performing monthly self breast exams.  Mammogram and bone density have been  ordered.Patient will call to schedule these.  She politely declines pelvic exam in the absence of symptoms and she is no longer screening for cervical cancer.    Screening labs ordered.  Encouraged to remain active.       Relevant Orders   Comprehensive metabolic panel   CBC with Differential/Platelet   Hemoglobin A1c  Lipid panel   VITAMIN D 25 Hydroxy (Vit-D Deficiency, Fractures)   MM 3D SCREEN BREAST BILATERAL   DG Bone Density   Ambulatory referral to Psychology     I have discontinued Lelan Pons B. Queener "Tevis Mattes"'s Levothyroxine Sodium and doxycycline. I am also having her maintain her brinzolamide, methazolamide, budesonide, omeprazole, albuterol, montelukast, Tirosint, and Breo Ellipta.   No orders of the defined types were placed in this encounter.   Return precautions given.   Risks, benefits, and alternatives of the medications and treatment plan prescribed today were discussed, and patient expressed understanding.   Education regarding symptom management and diagnosis given to patient on AVS.   Continue to follow with Flinch[place], Kelby Aline, FNP for routine health maintenance.   Clarene Reamer and I agreed with plan.   Mable Paris, FNP

## 2021-10-11 NOTE — Assessment & Plan Note (Addendum)
Uncontrolled.  Situational as it relates to a strained relationship with grandson.  Referral for counseling.  Patient politely declines  medication at this time.  We can consider at follow-up.

## 2021-10-11 NOTE — Patient Instructions (Addendum)
Please call  and schedule your 3D mammogram and /or bone density scan as we discussed.   [place]  [place], [place]   Nice to meet you!  Health Maintenance for Postmenopausal Women Menopause is a normal process in which your ability to get pregnant comes to an end. This process happens slowly over many months or years, usually between the ages of 76 and 32. Menopause is complete when you have missed your menstrual period for 12 months. It is important to talk with your health care provider about some of the most common conditions that affect women after menopause (postmenopausal women). These include heart disease, cancer, and bone loss (osteoporosis). Adopting a healthy lifestyle and getting preventive care can help to promote your health and wellness. The actions you take can also lower your chances of developing some of these common conditions. What are the signs and symptoms of menopause? During menopause, you may have the following symptoms: Hot flashes. These can be moderate or severe. Night sweats. Decrease in sex drive. Mood swings. Headaches. Tiredness (fatigue). Irritability. Memory problems. Problems falling asleep or staying asleep. Talk with your health care provider about treatment options for your symptoms. Do I need hormone replacement therapy? Hormone replacement therapy is effective in treating symptoms that are caused by menopause, such as hot flashes and night sweats. Hormone replacement carries certain risks, especially as you become older. If you are thinking about using estrogen or estrogen with progestin, discuss the benefits and risks with your health care provider. How can I reduce my risk for heart disease and stroke? The risk of heart disease, heart attack, and stroke increases as you age. One of the causes may be a change in the body's hormones during menopause. This can affect how your body uses  dietary fats, triglycerides, and cholesterol. Heart attack and stroke are medical emergencies. There are many things that you can do to help prevent heart disease and stroke. Watch your blood pressure High blood pressure causes heart disease and increases the risk of stroke. This is more likely to develop in people who have high blood pressure readings or are overweight. Have your blood pressure checked: Every 3-5 years if you are 26-61 years of age. Every year if you are 24 years old or older. Eat a healthy diet  Eat a diet that includes plenty of vegetables, fruits, low-fat dairy products, and lean protein. Do not eat a lot of foods that are high in solid fats, added sugars, or sodium. Get regular exercise Get regular exercise. This is one of the most important things you can do for your health. Most adults should: Try to exercise for at least 150 minutes each week. The exercise should increase your heart rate and make you sweat (moderate-intensity exercise). Try to do strengthening exercises at least twice each week. Do these in addition to the moderate-intensity exercise. Spend less time sitting. Even light physical activity can be beneficial. Other tips Work with your health care provider to achieve or maintain a healthy weight. Do not use any products that contain nicotine or tobacco. These products include cigarettes, chewing tobacco, and vaping devices, such as e-cigarettes. If you need help quitting, ask your health care provider. Know your numbers. Ask your health care provider to check your cholesterol and your blood sugar (glucose). Continue to have your blood tested as directed by your health care provider. Do I need screening for cancer? Depending on your health history and  family history, you may need to have cancer screenings at different stages of your life. This may include screening for: Breast cancer. Cervical cancer. Lung cancer. Colorectal cancer. What is my risk for  osteoporosis? After menopause, you may be at increased risk for osteoporosis. Osteoporosis is a condition in which bone destruction happens more quickly than new bone creation. To help prevent osteoporosis or the bone fractures that can happen because of osteoporosis, you may take the following actions: If you are 58-56 years old, get at least 1,000 mg of calcium and at least 600 international units (IU) of vitamin D per day. If you are older than age 64 but younger than age 45, get at least 1,200 mg of calcium and at least 600 international units (IU) of vitamin D per day. If you are older than age 77, get at least 1,200 mg of calcium and at least 800 international units (IU) of vitamin D per day. Smoking and drinking excessive alcohol increase the risk of osteoporosis. Eat foods that are rich in calcium and vitamin D, and do weight-bearing exercises several times each week as directed by your health care provider. How does menopause affect my mental health? Depression may occur at any age, but it is more common as you become older. Common symptoms of depression include: Feeling depressed. Changes in sleep patterns. Changes in appetite or eating patterns. Feeling an overall lack of motivation or enjoyment of activities that you previously enjoyed. Frequent crying spells. Talk with your health care provider if you think that you are experiencing any of these symptoms. General instructions See your health care provider for regular wellness exams and vaccines. This may include: Scheduling regular health, dental, and eye exams. Getting and maintaining your vaccines. These include: Influenza vaccine. Get this vaccine each year before the flu season begins. Pneumonia vaccine. Shingles vaccine. Tetanus, diphtheria, and pertussis (Tdap) booster vaccine. Your health care provider may also recommend other immunizations. Tell your health care provider if you have ever been abused or do not feel safe at  home. Summary Menopause is a normal process in which your ability to get pregnant comes to an end. This condition causes hot flashes, night sweats, decreased interest in sex, mood swings, headaches, or lack of sleep. Treatment for this condition may include hormone replacement therapy. Take actions to keep yourself healthy, including exercising regularly, eating a healthy diet, watching your weight, and checking your blood pressure and blood sugar levels. Get screened for cancer and depression. Make sure that you are up to date with all your vaccines. This information is not intended to replace advice given to you by your health care provider. Make sure you discuss any questions you have with your health care provider. Document Revised: 03/15/2021 Document Reviewed: 03/15/2021 Elsevier Patient Education  [employer].

## 2021-10-13 ENCOUNTER — Other Ambulatory Visit (INDEPENDENT_AMBULATORY_CARE_PROVIDER_SITE_OTHER): Payer: 59

## 2021-10-13 ENCOUNTER — Other Ambulatory Visit: Payer: Self-pay

## 2021-10-13 DIAGNOSIS — Z0000 Encounter for general adult medical examination without abnormal findings: Secondary | ICD-10-CM

## 2021-10-13 LAB — CBC WITH DIFFERENTIAL/PLATELET
Basophils Absolute: 0.1 10*3/uL (ref 0.0–0.1)
Basophils Relative: 0.8 % (ref 0.0–3.0)
Eosinophils Absolute: 0.1 10*3/uL (ref 0.0–0.7)
Eosinophils Relative: 0.9 % (ref 0.0–5.0)
HCT: 42.3 % (ref 36.0–46.0)
Hemoglobin: 14 g/dL (ref 12.0–15.0)
Lymphocytes Relative: 25.6 % (ref 12.0–46.0)
Lymphs Abs: 1.5 10*3/uL (ref 0.7–4.0)
MCHC: 33 g/dL (ref 30.0–36.0)
MCV: 91.7 fl (ref 78.0–100.0)
Monocytes Absolute: 0.6 10*3/uL (ref 0.1–1.0)
Monocytes Relative: 9.9 % (ref 3.0–12.0)
Neutro Abs: 3.7 10*3/uL (ref 1.4–7.7)
Neutrophils Relative %: 62.8 % (ref 43.0–77.0)
Platelets: 201 10*3/uL (ref 150.0–400.0)
RBC: 4.61 Mil/uL (ref 3.87–5.11)
RDW: 13.4 % (ref 11.5–15.5)
WBC: 6 10*3/uL (ref 4.0–10.5)

## 2021-10-13 LAB — COMPREHENSIVE METABOLIC PANEL
ALT: 14 U/L (ref 0–35)
AST: 18 U/L (ref 0–37)
Albumin: 4.3 g/dL (ref 3.5–5.2)
Alkaline Phosphatase: 74 U/L (ref 39–117)
BUN: 10 mg/dL (ref 6–23)
CO2: 28 mEq/L (ref 19–32)
Calcium: 9.3 mg/dL (ref 8.4–10.5)
Chloride: 103 mEq/L (ref 96–112)
Creatinine, Ser: 0.8 mg/dL (ref 0.40–1.20)
GFR: 75.44 mL/min (ref >60.00)
Glucose, Bld: 92 mg/dL (ref 70–99)
Potassium: 4 mEq/L (ref 3.5–5.1)
Sodium: 138 mEq/L (ref 135–145)
Total Bilirubin: 0.6 mg/dL (ref 0.2–1.2)
Total Protein: 7 g/dL (ref 6.0–8.3)

## 2021-10-13 LAB — LIPID PANEL
Cholesterol: 207 mg/dL — ABNORMAL HIGH (ref 0–200)
HDL: 62.7 mg/dL (ref >39.00)
LDL Cholesterol: 114 mg/dL — ABNORMAL HIGH (ref 0–99)
NonHDL: 144.42
Total CHOL/HDL Ratio: 3
Triglycerides: 152 mg/dL — ABNORMAL HIGH (ref 0.0–149.0)
VLDL: 30.4 mg/dL (ref 0.0–40.0)

## 2021-10-13 LAB — HEMOGLOBIN A1C: Hgb A1c MFr Bld: 5.4 % (ref 4.6–6.5)

## 2021-10-13 LAB — VITAMIN D 25 HYDROXY (VIT D DEFICIENCY, FRACTURES): VITD: 36.97 ng/mL (ref 30.00–100.00)

## 2021-11-10 LAB — HM MAMMOGRAPHY

## 2021-11-22 ENCOUNTER — Ambulatory Visit: Payer: 59 | Admitting: Adult Health

## 2021-11-24 ENCOUNTER — Other Ambulatory Visit: Payer: Self-pay

## 2021-11-24 ENCOUNTER — Encounter: Payer: Self-pay | Admitting: Adult Health

## 2021-11-24 ENCOUNTER — Other Ambulatory Visit: Payer: Self-pay | Admitting: Adult Health

## 2021-11-24 ENCOUNTER — Ambulatory Visit (INDEPENDENT_AMBULATORY_CARE_PROVIDER_SITE_OTHER): Payer: 59 | Admitting: Adult Health

## 2021-11-24 VITALS — BP 120/78 | HR 69 | Ht 62.52 in | Wt 208.6 lb

## 2021-11-24 DIAGNOSIS — F320 Major depressive disorder, single episode, mild: Secondary | ICD-10-CM

## 2021-11-24 DIAGNOSIS — E538 Deficiency of other specified B group vitamins: Secondary | ICD-10-CM

## 2021-11-24 DIAGNOSIS — Z6837 Body mass index (BMI) 37.0-37.9, adult: Secondary | ICD-10-CM

## 2021-11-24 DIAGNOSIS — Z8719 Personal history of other diseases of the digestive system: Secondary | ICD-10-CM

## 2021-11-24 DIAGNOSIS — H401131 Primary open-angle glaucoma, bilateral, mild stage: Secondary | ICD-10-CM

## 2021-11-24 DIAGNOSIS — F419 Anxiety disorder, unspecified: Secondary | ICD-10-CM | POA: Insufficient documentation

## 2021-11-24 LAB — COMPREHENSIVE METABOLIC PANEL
ALT: 15 U/L (ref 0–35)
AST: 16 U/L (ref 0–37)
Albumin: 4.3 g/dL (ref 3.5–5.2)
Alkaline Phosphatase: 76 U/L (ref 39–117)
BUN: 14 mg/dL (ref 6–23)
CO2: 28 mEq/L (ref 19–32)
Calcium: 9.2 mg/dL (ref 8.4–10.5)
Chloride: 105 mEq/L (ref 96–112)
Creatinine, Ser: 0.79 mg/dL (ref 0.40–1.20)
GFR: 76.53 mL/min (ref >60.00)
Glucose, Bld: 98 mg/dL (ref 70–99)
Potassium: 4.3 mEq/L (ref 3.5–5.1)
Sodium: 140 mEq/L (ref 135–145)
Total Bilirubin: 0.6 mg/dL (ref 0.2–1.2)
Total Protein: 7.2 g/dL (ref 6.0–8.3)

## 2021-11-24 LAB — CBC WITH DIFFERENTIAL/PLATELET
Basophils Absolute: 0 10*3/uL (ref 0.0–0.1)
Basophils Relative: 0.8 % (ref 0.0–3.0)
Eosinophils Absolute: 0.1 10*3/uL (ref 0.0–0.7)
Eosinophils Relative: 1.2 % (ref 0.0–5.0)
HCT: 43.1 % (ref 36.0–46.0)
Hemoglobin: 14.1 g/dL (ref 12.0–15.0)
Lymphocytes Relative: 24.2 % (ref 12.0–46.0)
Lymphs Abs: 1.4 10*3/uL (ref 0.7–4.0)
MCHC: 32.6 g/dL (ref 30.0–36.0)
MCV: 91.4 fl (ref 78.0–100.0)
Monocytes Absolute: 0.5 10*3/uL (ref 0.1–1.0)
Monocytes Relative: 9.5 % (ref 3.0–12.0)
Neutro Abs: 3.7 10*3/uL (ref 1.4–7.7)
Neutrophils Relative %: 64.3 % (ref 43.0–77.0)
Platelets: 204 10*3/uL (ref 150.0–400.0)
RBC: 4.71 Mil/uL (ref 3.87–5.11)
RDW: 13.4 % (ref 11.5–15.5)
WBC: 5.7 10*3/uL (ref 4.0–10.5)

## 2021-11-24 LAB — B12 AND FOLATE PANEL
Folate: 10.1 ng/mL (ref >5.9)
Vitamin B-12: 210 pg/mL — ABNORMAL LOW (ref 211–911)

## 2021-11-24 LAB — TSH: TSH: 1.47 u[IU]/mL (ref 0.35–5.50)

## 2021-11-24 MED ORDER — HYDROXYZINE HCL 10 MG PO TABS
10.0000 mg | ORAL_TABLET | Freq: Every evening | ORAL | 0 refills | Status: AC | PRN
Start: 2021-11-24 — End: ?

## 2021-11-24 NOTE — Assessment & Plan Note (Signed)
Will try atarax PRN.  Consider Wellbutrin but with history of glaucoma, will want to clear with opthalmology at [facility]. referral placed to [facility].

## 2021-11-24 NOTE — Progress Notes (Signed)
Orders Placed This Encounter  Procedures   Intrinsic Factor Antibodies    Standing Status:   Future    Standing Expiration Date:   11/24/2022

## 2021-11-24 NOTE — Progress Notes (Signed)
Established Patient Office Visit  Subjective:  Patient ID: Karla Turner, female    DOB: 10-18-1951  Age: 71 y.o. MRN: [id number]  CC:  Chief Complaint  Patient presents with   Follow-up    HPI Karla Turner presents for follow up.  She saw Denice Paradise on 11/17/20 at [facility] for issues with diarrhea. She is set for colonoscopy due to history colonoscopy.   She is having some depression and anxiety. Denies any suicidal / homicidal ideations or intents. Tearful.   Patient  denies any fever, body aches,chills, rash, chest pain, shortness of breath, nausea, vomiting, or diarrhea.  Denies dizziness, lightheadedness, pre syncopal or syncopal episodes.     Past Medical History:  Diagnosis Date   Arthritis    Asthma    Bronchitis    Cataracts, bilateral    Colon polyps    Depression    Diverticulitis    GERD (gastroesophageal reflux disease)    Glaucoma    Hyperlipidemia    Hypothyroidism    Lumbar herniated disc    Osteoporosis     Past Surgical History:  Procedure Laterality Date   BIOPSY BREAST  2019   BREAST SURGERY     cataract surgery Bilateral    CHOLECYSTECTOMY  01/2021   COLONOSCOPY     COLONOSCOPY WITH PROPOFOL N/A 01/29/2018   Procedure: COLONOSCOPY WITH PROPOFOL;  Surgeon: Manya Silvas, MD;  Location: [facility] ENDOSCOPY;  Service: Endoscopy;  Laterality: N/A;   DILATION AND CURETTAGE OF UTERUS     ESOPHAGOGASTRODUODENOSCOPY (EGD) WITH PROPOFOL N/A 01/29/2018   Procedure: ESOPHAGOGASTRODUODENOSCOPY (EGD) WITH PROPOFOL;  Surgeon: Manya Silvas, MD;  Location: [facility] ENDOSCOPY;  Service: Endoscopy;  Laterality: N/A;   EYE SURGERY     mamoplasty Bilateral     Family History  Problem Relation Age of Onset   Breast cancer Sister    Fibromyalgia Sister    Allergies Son    Asthma Son        as a child    Cancer Maternal Grandmother    Heart attack Maternal Grandfather     Social History   Socioeconomic History   Marital status:  Married    Spouse name: Not on file   Number of children: Not on file   Years of education: Not on file   Highest education level: Not on file  Occupational History   Not on file  Tobacco Use   Smoking status: Never   Smokeless tobacco: Never  Vaping Use   Vaping Use: Never used  Substance and Sexual Activity   Alcohol use: Yes    Comment: occasionally   Drug use: No   Sexual activity: Yes  Other Topics Concern   Not on file  Social History Narrative   Lives at home with family   Social Determinants of Health   Financial Resource Strain: Not on file  Food Insecurity: Not on file  Transportation Needs: Not on file  Physical Activity: Not on file  Stress: Not on file  Social Connections: Not on file  Intimate Partner Violence: Not on file    Outpatient Medications Prior to Visit  Medication Sig Dispense Refill   albuterol (VENTOLIN HFA) 108 (90 Base) MCG/ACT inhaler Inhale 2 puffs into the lungs every 6 (six) hours as needed for wheezing or shortness of breath. 1 each 11   BREO ELLIPTA 100-25 MCG/ACT AEPB INHALE 1 DOSE BY MOUTH DAILY. 180 each 3   brinzolamide (AZOPT) 1 % ophthalmic suspension Place  drop into the right eye 3 (three) times daily.    ° methazolamide (NEPTAZANE) 25 MG tablet Take 25 mg by mouth 3 (three) times daily.    ° montelukast (SINGULAIR) 10 MG tablet TAKE 1 TABLET BY MOUTH AT BEDTIME 90 tablet 2  ° omeprazole (PRILOSEC) 20 MG capsule Take 1 capsule (20 mg total) by mouth daily. 90 capsule 1  ° TIROSINT 88 MCG CAPS TAKE 1 CAPSULE BY MOUTH ONCE DAILY 90 capsule 0  ° budesonide (PULMICORT) 0.25 MG/2ML nebulizer solution TAKE 1 VIAL BY NEBULIZATION TWO TIMES A DAY AS NEEDED (Patient not taking: Reported on 11/24/2021) 60 mL 4  ° °No facility-administered medications prior to visit.  ° ° °Allergies  °Allergen Reactions  ° White Petrolatum   °  Other reaction(s): Other (See Comments) °Pt states that she used Invisalign (which she states is a petroleum product and  afterward developed systemic "poisoning."  ° ° °ROS °Review of Systems  °Constitutional: Negative.   °HENT: Negative.    °Respiratory: Negative.    °Cardiovascular: Negative.   °Genitourinary: Negative.   °Neurological: Negative.   °Psychiatric/Behavioral:  Positive for behavioral problems, confusion and sleep disturbance. Negative for agitation, decreased concentration, dysphoric mood, hallucinations, self-injury and suicidal ideas. The patient is nervous/anxious. The patient is not hyperactive.   ° °  °Objective:  °  °Physical Exam ° °General: Appearance:    Mildly obese female in no acute distress  °Eyes:    PERRL, conjunctiva/corneas clear, EOM's intact       °Lungs:     Clear to auscultation bilaterally, respirations unlabored  °Heart:    Normal heart rate. Normal rhythm. No murmurs, rubs, or gallops.  °  °MS:   All extremities are intact.  °  °Neurologic:   Awake, alert, oriented x 3. No apparent focal neurological           defect.   °  °BP 120/78    Pulse 69    Ht 5' 2.52" (1.588 m)    Wt 208 lb 9.6 oz (94.6 kg)    SpO2 97%    BMI 37.52 kg/m²  °Wt Readings from Last 3 Encounters:  °11/24/21 208 lb 9.6 oz (94.6 kg)  °10/11/21 205 lb 8 oz (93.2 kg)  °03/15/21 202 lb 9.6 oz (91.9 kg)  ° ° ° °Health Maintenance Due  °Topic Date Due  ° COVID-19 Vaccine (4 - Booster for Pfizer series) 10/19/2020  ° MAMMOGRAM  02/11/2021  ° ° °There are no preventive care reminders to display for this patient. ° °Lab Results  °Component Value Date  ° TSH 1.47 11/24/2021  ° °Lab Results  °Component Value Date  ° WBC 5.7 11/24/2021  ° HGB 14.1 11/24/2021  ° HCT 43.1 11/24/2021  ° MCV 91.4 11/24/2021  ° PLT 204.0 11/24/2021  ° °Lab Results  °Component Value Date  ° NA 140 11/24/2021  ° K 4.3 11/24/2021  ° CO2 28 11/24/2021  ° GLUCOSE 98 11/24/2021  ° BUN 14 11/24/2021  ° CREATININE 0.79 11/24/2021  ° BILITOT 0.6 11/24/2021  ° ALKPHOS 76 11/24/2021  ° AST 16 11/24/2021  ° ALT 15 11/24/2021  ° PROT 7.2 11/24/2021  ° ALBUMIN 4.3  11/24/2021  ° CALCIUM 9.2 11/24/2021  ° ANIONGAP 10 07/20/2017  ° EGFR 78 01/22/2021  ° GFR 76.53 11/24/2021  ° °Lab Results  °Component Value Date  ° CHOL 207 (H) 10/13/2021  ° °Lab Results  °Component Value Date  ° HDL 62.70 10/13/2021  ° °Lab Results  °  Results  Component Value Date   LDLCALC 114 (H) 10/13/2021   Lab Results  Component Value Date   TRIG 152.0 (H) 10/13/2021   Lab Results  Component Value Date   CHOLHDL 3 10/13/2021   Lab Results  Component Value Date   HGBA1C 5.4 10/13/2021      Assessment & Plan:   Problem List Items Addressed This Visit       Other   Primary open angle glaucoma (POAG) of both eyes, mild stage    Followed at [facility] opthalmology.       History of diverticulitis - Primary   Depression, major, single episode, mild (HCC)   Relevant Medications   hydrOXYzine (ATARAX) 10 MG tablet   Other Relevant Orders   Ambulatory referral to Psychiatry   AMB Referral to [facility]    Will try atarax PRN.  Consider Wellbutrin but with history of glaucoma, will want to clear with opthalmology at [facility]. referral [facility] to [facility].       Relevant Medications   hydrOXYzine (ATARAX) 10 MG tablet   Other Relevant Orders   TSH (Completed)   CBC with Differential/Platelet (Completed)   Comprehensive metabolic panel (Completed)   B12 deficiency   Relevant Orders   B12 and Folate Panel (Completed)    Meds ordered this encounter  Medications   hydrOXYzine (ATARAX) 10 MG tablet    Sig: Take 1 tablet (10 mg total) by mouth at bedtime as needed for anxiety (may cause drowsiness.).    Dispense:  30 tablet    Refill:  0   Will CC [facility] to discuss possible Wellbutrin risk versus benefits and possible closer follow ups to monitor glaucoma which patient reports has been stable and has been seeing opthalmology at [facility] now every 6 months.  Follow treatment plan from office  if not improving or any worsening within 72  hours and also return to office or open medical facility at ANYTIME if any symptoms persist, change, or worsen or you have any further concerns or questions. Call 911 immediately for emergencies.   Follow-up: Return in about 1 month (around 12/25/2021), or if symptoms worsen or fail to improve, for at any time for any worsening symptoms, Go to Emergency room/ urgent care if worse.    Marcille Buffy, FNP

## 2021-11-24 NOTE — Assessment & Plan Note (Signed)
Followed at [facility] opthalmology.

## 2021-11-24 NOTE — Patient Instructions (Addendum)
[facility] to schedule. See if Dr. Lyndel Safe ok with Wellbutrin, I will also send him a message.  [facility] in [place], [place] Address: [address], [place], [place] [address] Hours:  Open ? Closes 5:30?PM Products and Services: [email or web address] Phone: [phone]   Vitamin D3 at 4,000 international units once daily.    Ok to try a good brand name melatonin start with 10 mg melatonin, I prefer sublingual( under tongue) or the liquid drops.     Hydroxyzine Capsules or Tablets What is this medication? HYDROXYZINE (hye [place] i zeen) treats the symptoms of allergies and allergic reactions. It may also be used to treat anxiety or cause drowsiness before a procedure. It works by blocking histamine, a substance released by the body during an allergic reaction. It belongs to a group of medications called antihistamines. This medicine may be used for other purposes; ask your health care provider or pharmacist if you have questions. COMMON BRAND NAME(S): ANX, Atarax, Rezine, Vistaril What should I tell my care team before I take this medication? They need to know if you have any of these conditions:  Heart disease History of irregular heartbeat Kidney disease Liver disease Lung or breathing disease, like asthma Stomach or intestine problems Thyroid disease Trouble passing urine An unusual or allergic reaction to hydroxyzine, cetirizine, other medications, foods, dyes or preservatives Pregnant or trying to get pregnant Breast-feeding How should I use this medication? Take this medication by mouth with a full glass of water. Follow the directions on the prescription label. You may take this medication with food or on an empty stomach. Take your medication at regular intervals. Do not take your medication more often than directed. Talk to your care team regarding the use of this medication in children. Special care may be needed.  While this medication may be prescribed for children as young as 40 years of age for selected conditions, precautions do apply. Patients over 61 years old may have a stronger reaction and need a smaller dose. Overdosage: If you think you have taken too much of this medicine contact a poison control center or emergency room at once. NOTE: This medicine is only for you. Do not share this medicine with others. What if I miss a dose? If you miss a dose, take it as soon as you can. If it is almost time for your next dose, take only that dose. Do not take double or extra doses. What may interact with this medication? Do not take this medication with any of the following: Cisapride Dronedarone Pimozide Thioridazine This medication may also interact with the following: Alcohol Antihistamines for allergy, cough, and cold Atropine Barbiturate medications for sleep or seizures, like phenobarbital Certain antibiotics like erythromycin or clarithromycin Certain medications for anxiety or sleep Certain medications for bladder problems like oxybutynin, tolterodine Certain medications for depression or psychotic disturbances Certain medications for irregular heart beat Certain medications for Parkinson's disease like benztropine, trihexyphenidyl Certain medications for seizures like phenobarbital, primidone Certain medications for stomach problems like dicyclomine, hyoscyamine Certain medications for travel sickness like scopolamine Ipratropium Narcotic medications for pain Other medications that prolong the QT interval (which can cause an abnormal heart rhythm) like dofetilide This list may not describe all possible interactions. Give your health care provider a list of all the medicines, herbs, non-prescription drugs, or dietary supplements you use. Also tell them if you smoke, drink alcohol, or use illegal drugs. Some items may interact with your medicine. What  should I watch for while using this  medication? Tell your care team if your symptoms do not improve. You may get drowsy or dizzy. Do not drive, use machinery, or do anything that needs mental alertness until you know how this medication affects you. Do not stand or sit up quickly, especially if you are an older patient. This reduces the risk of dizzy or fainting spells. Alcohol may interfere with the effect of this medication. Avoid alcoholic drinks. Your mouth may get dry. Chewing sugarless gum or sucking hard candy, and drinking plenty of water may help. Contact your care team if the problem does not go away or is severe. This medication may cause dry eyes and blurred vision. If you wear contact lenses you may feel some discomfort. Lubricating drops may help. See your eye care specialist if the problem does not go away or is severe. If you are receiving skin tests for allergies, tell your care team you are using this medication. What side effects may I notice from receiving this medication? Side effects that you should report to your care team as soon as possible: Allergic reactions--skin rash, itching, hives, swelling of the face, lips, tongue, or throat Heart rhythm changes--fast or irregular heartbeat, dizziness, feeling faint or lightheaded, chest pain, trouble breathing Side effects that usually do not require medical attention (report to your care team if they continue or are bothersome): Confusion Drowsiness Dry mouth Hallucinations Headache This list may not describe all possible side effects. Call your doctor for medical advice about side effects. You may report side effects to FDA at 1-800-FDA-1088. Where should I keep my medication? Keep out of the reach of children and pets. Store at room temperature between 15 and 30 degrees C (59 and 86 degrees F). Keep container tightly closed. Throw away any unused medication after the expiration date. NOTE: This sheet is a summary. It may not cover all possible information. If  you have questions about this medicine, talk to your doctor, pharmacist, or health care provider.  2022 Elsevier/Gold Standard (2021-01-06 00:00:00) Managing Depression, Adult Depression is a mental health condition that affects your thoughts, feelings, and actions. Being diagnosed with depression can bring you relief if you did not know why you have felt or behaved a certain way. It could also leave you feeling overwhelmed with uncertainty about your future. Preparing yourself to manage your symptoms can help you feel more positive about your future. How to manage lifestyle changes Managing stress Stress is your body's reaction to life changes and events, both good and bad. Stress can add to your feelings of depression. Learning to manage your stress can help lessen your feelings of depression. Try some of the following approaches to reducing your stress (stress reduction techniques): Listen to music that you enjoy and that inspires you. Try using a meditation app or take a meditation class. Develop a practice that helps you connect with your spiritual self. Walk in nature, pray, or go to a place of worship. Do some deep breathing. To do this, inhale slowly through your nose. Pause at the top of your inhale for a few seconds and then exhale slowly, letting your muscles relax. Practice yoga to help relax and work your muscles. Choose a stress reduction technique that suits your lifestyle and personality. These techniques take time and practice to develop. Set aside 5-15 minutes a day to do them. Therapists can offer training in these techniques. Other things you can do to manage stress include: Keeping a stress  diary. Knowing your limits and saying no when you think something is too much. Paying attention to how you react to certain situations. You may not be able to control everything, but you can change your reaction. Adding humor to your life by watching funny films or TV shows. Making time for  activities that you enjoy and that relax you.  Medicines Medicines, such as antidepressants, are often a part of treatment for depression. Talk with your pharmacist or health care provider about all the medicines, supplements, and herbal products that you take, their possible side effects, and what medicines and other products are safe to take together. Make sure to report any side effects you may have to your health care provider. Relationships Your health care provider may suggest family therapy, couples therapy, or individual therapy as part of your treatment. How to recognize changes Everyone responds differently to treatment for depression. As you recover from depression, you may start to: Have more interest in doing activities. Feel less hopeless. Have more energy. Overeat less often, or have a better appetite. Have better mental focus. It is important to recognize if your depression is not getting better or is getting worse. The symptoms you had in the beginning may return, such as: Tiredness (fatigue) or low energy. Eating too much or too little. Sleeping too much or too little. Feeling restless, agitated, or hopeless. Trouble focusing or making decisions. Unexplained physical complaints. Feeling irritable, angry, or aggressive. If you or your family members notice these symptoms coming back, let your health care provider know right away. Follow these instructions at home: Activity  Try to get some form of exercise each day, such as walking, biking, swimming, or lifting weights. Practice stress reduction techniques. Engage your mind by taking a class or doing some volunteer work. Lifestyle Get the right amount and quality of sleep. Cut down on using caffeine, tobacco, alcohol, and other potentially harmful substances. Eat a healthy diet that includes plenty of vegetables, fruits, whole grains, low-fat dairy products, and lean protein. Do not eat a lot of foods that are high  in solid fats, added sugars, or salt (sodium). General instructions Take over-the-counter and prescription medicines only as told by your health care provider. Keep all follow-up visits as told by your health care provider. This is important. Where to find support Talking to others Friends and family members can be sources of support and guidance. Talk to trusted friends or family members about your condition. Explain your symptoms to them, and let them know that you are working with a health care provider to treat your depression. Tell friends and family members how they also can be helpful. Finances Find appropriate mental health providers that fit with your financial situation. Talk with your health care provider about options to get reduced prices on your medicines. Where to find more information You can find support in your area from: Anxiety and Depression Association of America (ADAA): www.adaa.org Mental Health America: www.mentalhealthamerica.net [employer] on Mental Illness: www.nami.org Contact a health care provider if: You stop taking your antidepressant medicines, and you have any of these symptoms: Nausea. Headache. Light-headedness. Chills and body aches. Not being able to sleep (insomnia). You or your friends and family think your depression is getting worse. Get help right away if: You have thoughts of hurting yourself or others. If you ever feel like you may hurt yourself or others, or have thoughts about taking your own life, get help right away. Go to your nearest emergency department  or: Call your local emergency services (911 in the [place] Call a suicide crisis helpline, such as the [place] at 970-628-2209 or 988 in the [place]. This is open 24 hours a day in the [place] Text the Crisis Text Line at (918)535-3882 (in the [place].). Summary If you are diagnosed with depression, preparing yourself to manage your symptoms is a good way to feel  positive about your future. Work with your health care provider on a management plan that includes stress reduction techniques, medicines (if applicable), therapy, and healthy lifestyle habits. Keep talking with your health care provider about how your treatment is working. If you have thoughts about taking your own life, call a suicide crisis helpline or text a crisis text line. This information is not intended to replace advice given to you by your health care provider. Make sure you discuss any questions you have with your health care provider. Document Revised: 05/19/2021 Document Reviewed: 09/04/2019 Elsevier Patient Education  2022 [place].  Generalized Anxiety Disorder, Adult Generalized anxiety disorder (GAD) is a mental health condition. Unlike normal worries, anxiety related to GAD is not triggered by a specific event. These worries do not fade or get better with time. GAD interferes with relationships, work, and school. GAD symptoms can vary from mild to severe. People with severe GAD can have intense waves of anxiety with physical symptoms that are similar to panic attacks. What are the causes? The exact cause of GAD is not known, but the following are believed to have an impact: Differences in natural brain chemicals. Genes passed down from parents to children. Differences in the way threats are perceived. Development and stress during childhood. Personality. What increases the risk? The following factors may make you more likely to develop this condition: Being female. Having a family history of anxiety disorders. Being very shy. Experiencing very stressful life events, such as the death of a loved one. Having a very stressful family environment. What are the signs or symptoms? People with GAD often worry excessively about many things in their lives, such as their health and family. Symptoms may also include: Mental and emotional symptoms: Worrying excessively about  natural disasters. Fear of being late. Difficulty concentrating. Fears that others are judging your performance. Physical symptoms: Fatigue. Headaches, muscle tension, muscle twitches, trembling, or feeling shaky. Feeling like your heart is pounding or beating very fast. Feeling out of breath or like you cannot take a deep breath. Having trouble falling asleep or staying asleep, or experiencing restlessness. Sweating. Nausea, diarrhea, or irritable bowel syndrome (IBS). Behavioral symptoms: Experiencing erratic moods or irritability. Avoidance of new situations. Avoidance of people. Extreme difficulty making decisions. How is this diagnosed? This condition is diagnosed based on your symptoms and medical history. You will also have a physical exam. Your health care provider may perform tests to rule out other possible causes of your symptoms. To be diagnosed with GAD, a person must have anxiety that: Is out of his or her control. Affects several different aspects of his or her life, such as work and relationships. Causes distress that makes him or her unable to take part in normal activities. Includes at least three symptoms of GAD, such as restlessness, fatigue, trouble concentrating, irritability, muscle tension, or sleep problems. Before your health care provider can confirm a diagnosis of GAD, these symptoms must be present more days than they are not, and they must last for 6 months or longer. How is this treated? This condition may be treated  with: Medicine. Antidepressant medicine is usually prescribed for long-term daily control. Anti-anxiety medicines may be added in severe cases, especially when panic attacks occur. Talk therapy (psychotherapy). Certain types of talk therapy can be helpful in treating GAD by providing support, education, and guidance. Options include: Cognitive behavioral therapy (CBT). People learn coping skills and self-calming techniques to ease their  physical symptoms. They learn to identify unrealistic thoughts and behaviors and to replace them with more appropriate thoughts and behaviors. Acceptance and commitment therapy (ACT). This treatment teaches people how to be mindful as a way to cope with unwanted thoughts and feelings. Biofeedback. This process trains you to manage your body's response (physiological response) through breathing techniques and relaxation methods. You will work with a therapist while machines are used to monitor your physical symptoms. Stress management techniques. These include yoga, meditation, and exercise. A mental health specialist can help determine which treatment is best for you. Some people see improvement with one type of therapy. However, other people require a combination of therapies. Follow these instructions at home: Lifestyle Maintain a consistent routine and schedule. Anticipate stressful situations. Create a plan and allow extra time to work with your plan. Practice stress management or self-calming techniques that you have learned from your therapist or your health care provider. Exercise regularly and spend time outdoors. Eat a healthy diet that includes plenty of vegetables, fruits, whole grains, low-fat dairy products, and lean protein. Do not eat a lot of foods that are high in fat, added sugar, or salt (sodium). Drink plenty of water. Avoid alcohol. Alcohol can increase anxiety. Avoid caffeine and certain over-the-counter cold medicines. These may make you feel worse. Ask your pharmacist which medicines to avoid. General instructions Take over-the-counter and prescription medicines only as told by your health care provider. Understand that you are likely to have setbacks. Accept this and be kind to yourself as you persist to take better care of yourself. Anticipate stressful situations. Create a plan and allow extra time to work with your plan. Recognize and accept your accomplishments, even  if you judge them as small. Spend time with people who care about you. Keep all follow-up visits. This is important. Where to find more information [place]: https://carter.com/ Substance Abuse and Mental Health Services: ktimeonline.com Contact a health care provider if: Your symptoms do not get better. Your symptoms get worse. You have signs of depression, such as: A persistently sad or irritable mood. Loss of enjoyment in activities that used to bring you joy. Change in weight or eating. Changes in sleeping habits. Get help right away if: You have thoughts about hurting yourself or others. If you ever feel like you may hurt yourself or others, or have thoughts about taking your own life, get help right away. Go to your nearest emergency department or: Call your local emergency services (911 in the [place] Call a suicide crisis helpline, such as the [place] at 228-348-6718 or 988 in the [place]. This is open 24 hours a day in the [place] Text the Crisis Text Line at (949) 680-3565 (in the [place].). Summary Generalized anxiety disorder (GAD) is a mental health condition that involves worry that is not triggered by a specific event. People with GAD often worry excessively about many things in their lives, such as their health and family. GAD may cause symptoms such as restlessness, trouble concentrating, sleep problems, frequent sweating, nausea, diarrhea, headaches, and trembling or muscle twitching. A mental health specialist can help determine which  treatment is best for you. Some people see improvement with one type of therapy. However, other people require a combination of therapies. This information is not intended to replace advice given to you by your health care provider. Make sure you discuss any questions you have with your health care provider. Document Revised: 05/19/2021 Document Reviewed: 02/14/2021 Elsevier Patient Education  [place].

## 2021-11-24 NOTE — Progress Notes (Signed)
Can we add on intrinsic factor since B12 is low ? I have placed order as future.

## 2021-11-25 ENCOUNTER — Telehealth: Payer: Self-pay

## 2021-11-25 NOTE — Progress Notes (Signed)
Not sure if I even sent this the first time, can we add on intrinsic factor I did add as future lab.

## 2021-11-25 NOTE — Chronic Care Management (AMB) (Signed)
Chronic Care Management   Note  11/25/2021 Name: Karla Turner MRN: [id number] DOB: 07-14-1951  Karla Turner is a 71 y.o. year old female who is a primary care patient of Flinchum, Kelby Aline, FNP. I reached out to Karla Turner by phone today in response to a referral sent by Ms. Suzie Portela Docter's PCP.  Karla Turner was given information about Chronic Care Management services today including:  CCM service includes personalized support from designated clinical staff supervised by her physician, including individualized plan of care and coordination with other care providers 24/7 contact phone numbers for assistance for urgent and routine care needs. Service will only be billed when office clinical staff spend 20 minutes or more in a month to coordinate care. Only one practitioner may furnish and bill the service in a calendar month. The patient may stop CCM services at any time (effective at the end of the month) by phone call to the office staff. The patient is responsible for co-pay (up to 20% after annual deductible is met) if co-pay is required by the individual health plan.   Patient agreed to services and verbal consent obtained.   Follow up plan: Telephone appointment with care management team member scheduled for:12/02/2021  Noreene Larsson, [place], [place], [place] [address] Direct Dial: [phone] [email or web address] Website: [email or web address]

## 2021-11-25 NOTE — Progress Notes (Signed)
Can we have patient come back in for intrinsic factor to be sure she can absorb oral b12 since b12 is low? Lab test ordered there was not enough blood to add on.   Also I did send her eye doctor a message about Wellbutrin and waiting to hear back from him.

## 2021-11-26 ENCOUNTER — Telehealth: Payer: Self-pay

## 2021-11-26 ENCOUNTER — Encounter: Payer: Self-pay | Admitting: Adult Health

## 2021-11-26 DIAGNOSIS — E538 Deficiency of other specified B group vitamins: Secondary | ICD-10-CM

## 2021-11-26 NOTE — Telephone Encounter (Signed)
Placed call to pt to scheduled lab appt. LMTCB

## 2021-11-26 NOTE — Telephone Encounter (Signed)
Opened in error

## 2021-11-29 NOTE — Progress Notes (Signed)
[facility]erral [facility] they see ages 71 to 36, please see if she has a preference for another location ?  Also please see if she has heard from her opthamologist regarding her glaucoma - [facility] and his thought on starting Wellbutrin and following glaucoma more closely ?

## 2021-12-01 ENCOUNTER — Other Ambulatory Visit (INDEPENDENT_AMBULATORY_CARE_PROVIDER_SITE_OTHER): Payer: 59

## 2021-12-01 ENCOUNTER — Other Ambulatory Visit: Payer: Self-pay

## 2021-12-01 DIAGNOSIS — E538 Deficiency of other specified B group vitamins: Secondary | ICD-10-CM | POA: Diagnosis not present

## 2021-12-02 ENCOUNTER — Ambulatory Visit (INDEPENDENT_AMBULATORY_CARE_PROVIDER_SITE_OTHER): Payer: 59 | Admitting: Pharmacist

## 2021-12-02 DIAGNOSIS — F320 Major depressive disorder, single episode, mild: Secondary | ICD-10-CM

## 2021-12-02 DIAGNOSIS — E7800 Pure hypercholesterolemia, unspecified: Secondary | ICD-10-CM

## 2021-12-02 DIAGNOSIS — H401131 Primary open-angle glaucoma, bilateral, mild stage: Secondary | ICD-10-CM

## 2021-12-02 NOTE — Chronic Care Management (AMB) (Signed)
Chronic Care Management CCM Pharmacy Note  12/02/2021 Name:  Karla Turner MRN:  [id number] DOB:  27-Aug-1951  Summary: - Patient elected to discontinue montelukast after reading about psychiatric side effects. Prefers to let this medication wash out before adding additional pharmacotherapy - Had questions about benefit vs risk of PPI therapy  Recommendations/Changes made from today's visit: - Encouraged to schedule follow up with pulmonary - Encouraged to communicate concerns with PPI therapy and her individual benefit vs risk - Encouraged to reach out to Dr. Lyndel Safe regarding thoughts on antidepressant use in glaucoma  Subjective: Karla Turner is an 71 y.o. year old female who is a primary patient of Flinchum, Kelby Aline, FNP.  The CCM team was consulted for assistance with disease management and care coordination needs.    Engaged with patient by telephone for initial visit for pharmacy case management and/or care coordination services.   Objective:  Medications Reviewed Today     Reviewed by De Hollingshead, RPH-CPP (Pharmacist) on 12/02/21 at 1024  Med List Status: <None>   Medication Order Taking? Sig Documenting Provider Last Dose Status Informant  albuterol (VENTOLIN HFA) 108 (90 Base) MCG/ACT inhaler [id number] No Inhale 2 puffs into the lungs every 6 (six) hours as needed for wheezing or shortness of breath.  Patient not taking: Reported on 12/02/2021   Hunsucker, Bonna Gains, MD Not Taking Active   BREO ELLIPTA 100-25 MCG/ACT AEPB [id number] Yes INHALE 1 DOSE BY MOUTH DAILY. Hunsucker, Bonna Gains, MD Taking Active   brinzolamide (AZOPT) 1 % ophthalmic suspension [id number] Yes Place 1 drop into the right eye 3 (three) times daily. [provider] Taking Active   budesonide (PULMICORT) 0.25 MG/2ML nebulizer solution [id number] No TAKE 1 VIAL BY NEBULIZATION TWO TIMES A DAY AS NEEDED  Patient not taking: Reported on 11/24/2021   Jodelle Green, FNP Not Taking Active    hydrOXYzine (ATARAX) 10 MG tablet [id number] Yes Take 1 tablet (10 mg total) by mouth at bedtime as needed for anxiety (may cause drowsiness.). Flinchum, Kelby Aline, FNP Taking Active   methazolamide (NEPTAZANE) 25 MG tablet [id number] Yes Take 25 mg by mouth 3 (three) times daily. [provider] Taking Active            Med Note Nat Christen Dec 02, 2021 10:02 AM) Taking twice daily  montelukast (SINGULAIR) 10 MG tablet [id number] No TAKE 1 TABLET BY MOUTH AT BEDTIME  Patient not taking: Reported on 12/02/2021   [name], NP Not Taking Active   omeprazole (PRILOSEC) 20 MG capsule [id number] Yes Take 1 capsule (20 mg total) by mouth daily. Leone Haven, MD Taking Active   TIROSINT 72 MCG CAPS [id number] Yes TAKE 1 CAPSULE BY MOUTH ONCE DAILY Flinchum, Kelby Aline, FNP Taking Active             Pertinent Labs:   Lab Results  Component Value Date   HGBA1C 5.4 10/13/2021   Lab Results  Component Value Date   CHOL 207 (H) 10/13/2021   HDL 62.70 10/13/2021   LDLCALC 114 (H) 10/13/2021   TRIG 152.0 (H) 10/13/2021   CHOLHDL 3 10/13/2021   Lab Results  Component Value Date   CREATININE 0.79 11/24/2021   BUN 14 11/24/2021   NA 140 11/24/2021   K 4.3 11/24/2021   CL 105 11/24/2021   CO2 28 11/24/2021    SDOH:  (Social Determinants of Health) assessments and interventions performed:  SDOH Interventions  Flowsheet Row Most Recent Value  °SDOH Interventions   °Financial Strain Interventions Intervention Not Indicated  ° °  ° ° °CCM Care Plan ° °Review of patient past medical history, allergies, medications, health status, including review of consultants reports, laboratory and other test data, was performed as part of comprehensive evaluation and provision of chronic care management services.  ° °Care Plan : Medication Management  °Updates made by Travis, Catherine E, RPH-CPP since 12/02/2021 12:00 AM  °  ° °Problem: Glaucoma, Asthma   °  ° °Long-Range  Goal: Disease Progression Prevention   °Start Date: 12/02/2021  °This Visit's Progress: On track  °Priority: High  °Note:   °Current Barriers:  °Unable to achieve control of depression  ° °Pharmacist Clinical Goal(s):  °patient will maintain control of asthma  through collaboration with PharmD and provider.  ° °Interventions: °1:1 collaboration with Flinchum, Michelle S, FNP regarding development and update of comprehensive plan of care as evidenced by provider attestation and co-signature °Inter-disciplinary care team collaboration (see longitudinal plan of care) °Comprehensive medication review performed; medication list updated in electronic medical record ° °Health Maintenance   °Yearly influenza vaccination: up to date °Td/Tdap vaccination: up to date °Pneumonia vaccination: up to date °COVID vaccinations: due - recommend bivalent booster °Shingrix vaccinations: up to date °Colonoscopy: up to date °Bone density scan: up to date °Mammogram: up to date ° °Hyperlipidemia:   °Uncontrolled; current treatment: none; °10 year ASCVD risk score: 7.4%  °Consider CAC scoring to further evaluate risk. ° °Asthma:   °Controlled; current treatment: Breo 100/25 mcg daily, albuterol HFA PRN - has not been needing, budesonide nebulizer - has not been needing; montelukast 10 mg daily - recently stopped after reading about psychiatric side effects.  °Wonders about appropriately using PPI. Discussed history and benefit when concerned about aspiration pneumonia °Discussed that more recent evidence has suggested that we are less concerned about psychiatric side effects of montelukast than we were initially when black box warning was added, however, patient elects to remain off therapy. Reviewed that she is overdue for pulmonary follow up. Encouraged to schedule.  ° °Depression: °Uncontrolled; current treatment: hydroxyzine 10 mg PRN   °Referral in place for psychiatry.  °Discussion with PCP about starting bupropion, however, concern  about worsening glaucoma. We are waiting to hear back from Dr Gupta regarding thoughts on effects of antidepressants on glaucoma. Per our evidence review, data is mixed; would advise initiation of therapy with close monitoring from Dr. Gupta, if benefit is needed. Patient prefers to wait and see if her depression improves after coming off montelukast. She will reach back out to our office if she does decide she would like to start something. She will reach out to Dr. Gupta via MyChart to ask about his thoughts on antidepressant use in glaucoma.   ° °Hx erosive gastris and diarrhea: °Controlled per patient report; current regimen: omeprazole 20 mg daily °Discussed long term risks of PPIs, though weighed with history of erosive esophagitis, and patient also reports family history of esophageal cancer. °Reports periodic diarrhea. Upcoming colonoscopy to evaluate. Patient wonders why an EGD is not also scheduled. She plans to call [facility] to discuss adding this on to evaluate esophageal riks.  °We reviewed that PPI use can be associated with diarrhea, but that the benefit may outweigh that risk given her family history. Encouraged to discuss with GI.  °Recommended to continue current regimen at this time.  ° °Patient Goals/Self-Care Activities °patient will:  °- take medications as prescribed   as evidenced by patient report and record review ° °  °  ° °Plan: Goals of care met. Closing case ° °Catie Travis, PharmD, BCACP, CPP °Clinical Pharmacist °[facility] °[phone] ° ° ° ° ° °

## 2021-12-02 NOTE — Patient Instructions (Signed)
Karla Turner,   I recommend you schedule follow up with pulmonary to discuss the montelukast. I recommend you talk to your GI team about continuing use of the omeprazole.   Please reach out if you decide you would like to start on an agent to help with depression and anxiety, after you've heard back from Dr. Lyndel Safe.   I recommend you schedule regular follow up Karla Turner before you leave the country in May.   Take care!  Karla Turner, PharmD  Visit Information   Following is a copy of your full care plan:  Care Plan : Medication Management  Updates made by De Hollingshead, RPH-CPP since 12/02/2021 12:00 AM     Problem: Glaucoma, Asthma      Long-Range Goal: Disease Progression Prevention   Start Date: 12/02/2021  This Visit's Progress: On track  Priority: High  Note:   Current Barriers:  Unable to achieve control of depression   Pharmacist Clinical Goal(s):  patient will maintain control of asthma  through collaboration with PharmD and provider.   Interventions: 1:1 collaboration with Flinchum, Karla Aline, FNP regarding development and update of comprehensive plan of care as evidenced by provider attestation and co-signature Inter-disciplinary care team collaboration (see longitudinal plan of care) Comprehensive medication review performed; medication list updated in electronic medical record  Health Maintenance   Yearly influenza vaccination: up to date Td/Tdap vaccination: up to date Pneumonia vaccination: up to date COVID vaccinations: due - recommend bivalent booster Shingrix vaccinations: up to date Colonoscopy: up to date Bone density scan: up to date Mammogram: up to date  Hyperlipidemia:   Uncontrolled; current treatment: none; 10 year ASCVD risk score: 7.4%  Consider CAC scoring to further evaluate risk.  Asthma:   Controlled; current treatment: Breo 100/25 mcg daily, albuterol HFA PRN - has not been needing, budesonide nebulizer - has not been needing;  montelukast 10 mg daily - recently stopped after reading about psychiatric side effects.  Wonders about appropriately using PPI. Discussed history and benefit when concerned about aspiration pneumonia Discussed that more recent evidence has suggested that we are less concerned about psychiatric side effects of montelukast than we were initially when black box warning was added, however, patient elects to remain off therapy. Reviewed that she is overdue for pulmonary follow up. Encouraged to schedule.   Depression: Uncontrolled; current treatment: hydroxyzine 10 mg PRN   Referral in place for psychiatry.  Discussion with PCP about starting bupropion, however, concern about worsening glaucoma. We are waiting to hear back from Dr Lyndel Safe regarding thoughts on effects of antidepressants on glaucoma. Per our evidence review, data is mixed; would advise initiation of therapy with close monitoring from Dr. Lyndel Safe, if benefit is needed. Patient prefers to wait and see if her depression improves after coming off montelukast. She will reach back out to our office if she does decide she would like to start something. She will reach out to Dr. Lyndel Safe via [place] to ask about his thoughts on antidepressant use in glaucoma.    Hx erosive gastris and diarrhea: Controlled per patient report; current regimen: omeprazole 20 mg daily Discussed long term risks of PPIs, though weighed with history of erosive esophagitis, and patient also reports family history of esophageal cancer. Reports periodic diarrhea. Upcoming colonoscopy to evaluate. Patient wonders why an EGD is not also scheduled. She plans to call [facility] to discuss adding this on to evaluate esophageal riks.  We reviewed that PPI use can be associated with diarrhea, but that the benefit  may outweigh that risk given her family history. Encouraged to discuss with GI.  Recommended to continue current regimen at this time.   Patient Goals/Self-Care Activities patient  will:  - take medications as prescribed as evidenced by patient report and record review      Consent to CCM Services: Ms. Shuffield was given information about Chronic Care Management services including:  CCM service includes personalized support from designated clinical staff supervised by her physician, including individualized plan of care and coordination with other care providers 24/7 contact phone numbers for assistance for urgent and routine care needs. Service will only be billed when office clinical staff spend 20 minutes or more in a month to coordinate care. Only one practitioner may furnish and bill the service in a calendar month. The patient may stop CCM services at any time (effective at the end of the month) by phone call to the office staff. The patient will be responsible for cost sharing (co-pay) of up to 20% of the service fee (after annual deductible is met).  Patient agreed to services and verbal consent obtained.   Plan: Goals of care met  Karla Turner, PharmD, [place], CPP Clinical Pharmacist [place] [facility] [phone]   Please call the care guide team at [phone] if you need to cancel or reschedule your appointment.   Patient verbalizes understanding of instructions and care plan provided today and agrees to view in [place]. Active MyChart status confirmed with patient.

## 2021-12-05 LAB — INTRINSIC FACTOR ANTIBODIES: Intrinsic Factor: NEGATIVE

## 2021-12-07 ENCOUNTER — Other Ambulatory Visit: Payer: Self-pay

## 2021-12-07 ENCOUNTER — Ambulatory Visit (INDEPENDENT_AMBULATORY_CARE_PROVIDER_SITE_OTHER): Payer: 59 | Admitting: Pulmonary Disease

## 2021-12-07 ENCOUNTER — Telehealth: Payer: Self-pay

## 2021-12-07 ENCOUNTER — Encounter: Payer: Self-pay | Admitting: Pulmonary Disease

## 2021-12-07 VITALS — BP 124/72 | HR 66 | Temp 98.7°F | Ht 64.0 in | Wt 210.0 lb

## 2021-12-07 DIAGNOSIS — E7800 Pure hypercholesterolemia, unspecified: Secondary | ICD-10-CM | POA: Diagnosis not present

## 2021-12-07 DIAGNOSIS — J4520 Mild intermittent asthma, uncomplicated: Secondary | ICD-10-CM | POA: Diagnosis not present

## 2021-12-07 DIAGNOSIS — F320 Major depressive disorder, single episode, mild: Secondary | ICD-10-CM

## 2021-12-07 MED ORDER — ALBUTEROL SULFATE HFA 108 (90 BASE) MCG/ACT IN AERS: 2.0000 | INHALATION_SPRAY | Freq: Four times a day (QID) | RESPIRATORY_TRACT | 11 refills | Status: DC | PRN

## 2021-12-07 NOTE — Patient Instructions (Addendum)
Reduce Breo to every other day  If breathing worsens go back to every day  Consider using Breo everyday on your [place] trip  Return to clinic in 5 months or sooner as needed

## 2021-12-07 NOTE — Progress Notes (Signed)
Patient ID: Karla Turner, female    DOB: 19-Jun-1951, 71 y.o.   MRN: [id number]  Chief Complaint  Patient presents with   Follow-up    Follow up from march 2022. Pt states that her breathing is doing fine with no issues noted     Referring provider: Doreen Beam, F*  HPI 71 y.o. female, never smoked (exposed to second hand smoke). PMH significant for asthma, bronchiectasis, COPD, obesity, glaucoma, positive ANA, dyslipidemia, GERD. Patient of Dr. [place] Bells, seen for initial consult on 01/08/19. History of pneumonia as child. Alpha-1 antitrypsin and immunodeficency labs were normal. FENO normal. PFTs showed evidence of moderate restriction and air trapping consistent with asthma. Maintained on Breo 100 and prn albuterol.   Doing well breathing is fine. Rare albuterol use. Nasal congestion better. Had worsening depressive symptoms, poor mood few months ago. Felt montelukast was contributing. She stopped and mood much netter.   Chest imaging: November 2019 CT chest images independently reviewed showing no pulmonary parenchymal abnormality with the exception of increased airway caliber specifically in the lower lobes bilaterally but no mucus plugging, no pulmonary parenchymal infiltrate, no masses. CT chest consistent with smooth cylindrical bronchiectasis rather than a cystic or mucopurulent variant.   FENO: 01/2019 11 ppb  Allergies  Allergen Reactions   White Petrolatum     Other reaction(s): Other (See Comments) Pt states that she used Invisalign (which she states is a petroleum product and afterward developed systemic "poisoning."    Immunization History  Administered Date(s) Administered   Fluad Quad(high Dose 65+) 08/27/2019, 08/17/2020, 07/16/2021   Influenza, High Dose Seasonal PF 09/07/2018   Influenza-Unspecified 09/05/2018, 08/17/2020   PFIZER(Purple Top)SARS-COV-2 Vaccination 12/15/2019, 01/08/2020, 08/24/2020   Pneumococcal Conjugate-13 09/07/2018   Pneumococcal  Polysaccharide-23 01/20/2020   Pneumococcal-Unspecified 09/09/2014   Tdap 06/09/2020   Zoster Recombinat (Shingrix) 09/02/2020, 12/07/2020    Past Medical History:  Diagnosis Date   Arthritis    Asthma    Bronchitis    Cataracts, bilateral    Colon polyps    Depression    Diverticulitis    GERD (gastroesophageal reflux disease)    Glaucoma    Hyperlipidemia    Hypothyroidism    Lumbar herniated disc    Osteoporosis     Tobacco History: Social History   Tobacco Use  Smoking Status Never  Smokeless Tobacco Never   Counseling given: Not Answered   Outpatient Medications Prior to Visit  Medication Sig Dispense Refill   BREO ELLIPTA 100-25 MCG/ACT AEPB INHALE 1 DOSE BY MOUTH DAILY. 180 each 3   brinzolamide (AZOPT) 1 % ophthalmic suspension Place 1 drop into the right eye 3 (three) times daily.     budesonide (PULMICORT) 0.25 MG/2ML nebulizer solution TAKE 1 VIAL BY NEBULIZATION TWO TIMES A DAY AS NEEDED 60 mL 4   methazolamide (NEPTAZANE) 25 MG tablet Take 25 mg by mouth 2 (two) times daily.     omeprazole (PRILOSEC) 20 MG capsule Take 1 capsule (20 mg total) by mouth daily. 90 capsule 1   TIROSINT 88 MCG CAPS TAKE 1 CAPSULE BY MOUTH ONCE DAILY 90 capsule 0   albuterol (VENTOLIN HFA) 108 (90 Base) MCG/ACT inhaler Inhale 2 puffs into the lungs every 6 (six) hours as needed for wheezing or shortness of breath. 1 each 11   hydrOXYzine (ATARAX) 10 MG tablet Take 1 tablet (10 mg total) by mouth at bedtime as needed for anxiety (may cause drowsiness.). (Patient not taking: Reported on 12/07/2021) 30 tablet 0  montelukast (SINGULAIR) 10 MG tablet TAKE 1 TABLET BY MOUTH AT BEDTIME (Patient not taking: Reported on 12/07/2021) 90 tablet 2   No facility-administered medications prior to visit.    Review of Systems n/a  Physical Exam  BP 124/72 (BP Location: Left Arm, Patient Position: Sitting, Cuff Size: Normal)    Pulse 66    Temp 98.7 F (37.1 C) (Oral)    Ht 5\' 4"  (1.626 m)     Wt 210 lb (95.3 kg)    SpO2 100%    BMI 36.05 kg/m  General: well appearing, in NAD Respiratory: CTAB, no wheeze, good air movement Cardiovascular: RRR, no murmurs Abd: ND,  BS present   Lab Results: Reviewed no elevated Eos CBC    Component Value Date/Time   WBC 5.7 11/24/2021 1158   RBC 4.71 11/24/2021 1158   HGB 14.1 11/24/2021 1158   HGB 13.0 01/22/2021 1029   HCT 43.1 11/24/2021 1158   HCT 40.2 01/22/2021 1029   PLT 204.0 11/24/2021 1158   PLT 220 01/22/2021 1029   MCV 91.4 11/24/2021 1158   MCV 90 01/22/2021 1029   MCV 88 02/21/2013 2153   MCH 29.0 01/22/2021 1029   MCH 31.1 07/20/2017 0827   MCHC 32.6 11/24/2021 1158   RDW 13.4 11/24/2021 1158   RDW 12.6 01/22/2021 1029   RDW 12.4 02/21/2013 2153   LYMPHSABS 1.4 11/24/2021 1158   LYMPHSABS 1.6 01/22/2021 1029   MONOABS 0.5 11/24/2021 1158   EOSABS 0.1 11/24/2021 1158   EOSABS 0.4 01/22/2021 1029   BASOSABS 0.0 11/24/2021 1158   BASOSABS 0.1 01/22/2021 1029    BMET    Component Value Date/Time   NA 140 11/24/2021 1158   NA 140 01/22/2021 1029   NA 139 02/21/2013 2153   K 4.3 11/24/2021 1158   K 3.7 02/21/2013 2153   CL 105 11/24/2021 1158   CL 108 (H) 02/21/2013 2153   CO2 28 11/24/2021 1158   CO2 27 02/21/2013 2153   GLUCOSE 98 11/24/2021 1158   GLUCOSE 135 (H) 02/21/2013 2153   BUN 14 11/24/2021 1158   BUN 12 01/22/2021 1029   BUN 12 02/21/2013 2153   CREATININE 0.79 11/24/2021 1158   CREATININE 0.67 02/21/2013 2153   CALCIUM 9.2 11/24/2021 1158   CALCIUM 8.5 02/21/2013 2153   GFRNONAA 75 08/24/2018 1111   GFRNONAA >60 02/21/2013 2153   GFRAA 87 08/24/2018 1111   GFRAA >60 02/21/2013 2153    BNP No results found for: BNP  ProBNP No results found for: PROBNP  Imaging: CT chest 09/2018: Mild basilar predominant bronchiectasis on my interperation    Assessment & Plan:   Asthma: Well controlled in general on Breo.  Provided refill of albuterol inhaler. Has PRN nebs as well. Will  reduce Breo every other day. IF ok through spring will plan to stop.   Depressive symptoms: Worse on montelukast, stopped with better symptoms. Ok to resume for short periods of time if nasal allergy symptoms recur for 1-2 weeks at a time but would avoid as maintenance therapy.  Bronchiectasis: mild. No sputum. CTM.    Lanier Clam, MD 12/07/2021

## 2021-12-07 NOTE — Telephone Encounter (Signed)
Called pt to discuss about how much Vit B12 she can consume. Unable to reach nor lvm due to vm being full.

## 2021-12-27 ENCOUNTER — Ambulatory Visit: Payer: 59 | Admitting: Adult Health

## 2021-12-27 NOTE — Progress Notes (Signed)
Patient had FU scheduled to discuss.

## 2022-01-12 ENCOUNTER — Ambulatory Visit: Payer: Self-pay | Admitting: Pharmacist

## 2022-01-12 NOTE — Chronic Care Management (AMB) (Signed)
?  Chronic Care Management  ? ?Note ? ?01/12/2022 ?Name: KATHLENE YANO MRN: [id number] DOB: June 09, 1951 ? ? ? ?Closing pharmacy CCM case at this time. Patient has clinic contact information for future questions or concerns.  ? ?Catie Darnelle Maffucci, PharmD, [place], CPP ?Clinical Pharmacist ?Therapist, music at [employer] ?[phone] ? ?

## 2022-02-03 ENCOUNTER — Encounter: Payer: Self-pay | Admitting: Gastroenterology

## 2022-02-03 NOTE — H&P (Addendum)
? ?Pre-Procedure H&P ?  ?Patient ID: Karla Turner is a 71 y.o. female. ? ?Gastroenterology Provider: Annamaria Helling, DO ? ?Referring Provider: Dawson Bills, NP ?PCP: Peggye Form, NP ? ?Date: 02/04/2022 ? ?HPI ?Karla Turner is a 71 y.o. female who presents today for Esophagogastroduodenoscopy and Colonoscopy for GERD, diarrhea. ?Patient with diarrhea since her cholecystectomy in February 2022.  This occurs approximately once every 3 weeks.  When she does have an episode and it is 5 bowel movements per day.  No nocturnal awakenings urgency or tenesmus.  She has no weight loss melena or hematochezia.  She notes most of the loose stools are postprandial.  She denies any abdominal pain with this. ? ?December 2022 with normal CBC TSH.  GI PCR negative for infection. ? ?Most recent lab work B12 210 TSH 1.7 intrinsic factor negative hemoglobin 14.1 MCV 91 platelets 204,000 creatinine 0.8. ? ?EGD and colonoscopy in March 2019 demonstrating grade a reflux normal duodenum erosive gastritis negative for H. pylori negative for Barrett's esophagus.  Similar findings in 2015 EGD. ?Colonoscopy demonstrating 1 tubular adenoma diverticulosis and internal hemorrhoids ? ?In 2017 she was found to have a tubulovillous adenoma.  2011 colonoscopy negative.  2015 and 2019 colonoscopies each with 1 tubular adenoma. ? ?Past Medical History:  ?Diagnosis Date  ? Arthritis   ? Asthma   ? Bronchitis   ? Cataracts, bilateral   ? Colon polyps   ? Depression   ? Diverticulitis   ? GERD (gastroesophageal reflux disease)   ? Glaucoma   ? Hyperlipidemia   ? Hypothyroidism   ? Lumbar herniated disc   ? Obesity (BMI 35.0-39.9 without comorbidity)   ? Osteoporosis   ? ? ?Past Surgical History:  ?Procedure Laterality Date  ? BIOPSY BREAST  2019  ? BREAST SURGERY    ? cataract surgery Bilateral   ? CHOLECYSTECTOMY  01/2021  ? COLONOSCOPY    ? COLONOSCOPY WITH PROPOFOL N/A 01/29/2018  ? Procedure: COLONOSCOPY WITH PROPOFOL;  Surgeon:  Manya Silvas, MD;  Location: [facility] ENDOSCOPY;  Service: Endoscopy;  Laterality: N/A;  ? DILATION AND CURETTAGE OF UTERUS    ? ESOPHAGOGASTRODUODENOSCOPY (EGD) WITH PROPOFOL N/A 01/29/2018  ? Procedure: ESOPHAGOGASTRODUODENOSCOPY (EGD) WITH PROPOFOL;  Surgeon: Manya Silvas, MD;  Location: [facility] ENDOSCOPY;  Service: Endoscopy;  Laterality: N/A;  ? EYE SURGERY    ? mamoplasty Bilateral   ? ? ?Family History ?Maternal Grandmother with history of esophageal cancer at age 33 ?Paternal Grandmother with h/o gastric cancer ?No other h/o GI disease or malignancy ? ?Review of Systems  ?Constitutional:  Negative for activity change, appetite change, chills, diaphoresis, fatigue, fever and unexpected weight change.  ?HENT:  Negative for trouble swallowing and voice change.   ?Respiratory:  Negative for shortness of breath and wheezing.   ?Cardiovascular:  Negative for chest pain, palpitations and leg swelling.  ?Gastrointestinal:  Positive for diarrhea. Negative for abdominal distention, abdominal pain, anal bleeding, blood in stool, constipation, nausea, rectal pain and vomiting.  ?     Acid reflux  ?Musculoskeletal:  Negative for arthralgias and myalgias.  ?Skin:  Negative for color change and pallor.  ?Neurological:  Negative for dizziness, syncope and weakness.  ?Psychiatric/Behavioral:  Negative for confusion.   ?All other systems reviewed and are negative.  ? ?Medications ?No current facility-administered medications on file prior to encounter.  ? ?Current Outpatient Medications on File Prior to Encounter  ?Medication Sig Dispense Refill  ? BREO ELLIPTA 100-25 MCG/ACT AEPB  INHALE 1 DOSE BY MOUTH DAILY. 180 each 3  ? brinzolamide (AZOPT) 1 % ophthalmic suspension Place 1 drop into the right eye 3 (three) times daily.    ? budesonide (PULMICORT) 0.25 MG/2ML nebulizer solution TAKE 1 VIAL BY NEBULIZATION TWO TIMES A DAY AS NEEDED 60 mL 4  ? methazolamide (NEPTAZANE) 25 MG tablet Take 25 mg by mouth 2 (two) times  daily.    ? montelukast (SINGULAIR) 10 MG tablet Take 10 mg by mouth at bedtime.    ? omeprazole (PRILOSEC) 20 MG capsule Take 1 capsule (20 mg total) by mouth daily. 90 capsule 1  ? TIROSINT 88 MCG CAPS TAKE 1 CAPSULE BY MOUTH ONCE DAILY 90 capsule 0  ? ? ?Pertinent medications related to GI and procedure were reviewed by me with the patient prior to the procedure ? ? ?Current Facility-Administered Medications:  ?  0.9 %  sodium chloride infusion, , Intravenous, Continuous, Annamaria Helling, DO, Last Rate: 20 mL/hr at 02/04/22 0754, 20 mL/hr at 02/04/22 0754 ?  ?  ? ?Allergies  ?Allergen Reactions  ? White Petrolatum   ?  Other reaction(s): Other (See Comments) ?Pt states that she used Invisalign (which she states is a petroleum product and afterward developed systemic "poisoning."  ? ?Allergies were reviewed by me prior to the procedure ? ?Objective  ? ? ?Vitals:  ? 02/04/22 0745  ?BP: 131/85  ?Pulse: 89  ?Resp: 20  ?Temp: (!) 97 ?F (36.1 ?C)  ?TempSrc: Temporal  ?SpO2: 99%  ?Weight: 89.8 kg  ?Height: '5\' 4"'$  (1.626 m)  ? ? ? ?Physical Exam ?Vitals and nursing note reviewed.  ?Constitutional:   ?   General: She is not in acute distress. ?   Appearance: Normal appearance. She is obese. She is not ill-appearing, toxic-appearing or diaphoretic.  ?HENT:  ?   Head: Normocephalic and atraumatic.  ?   Nose: Nose normal.  ?   Mouth/Throat:  ?   Mouth: Mucous membranes are moist.  ?   Pharynx: Oropharynx is clear.  ?Eyes:  ?   General: No scleral icterus. ?   Extraocular Movements: Extraocular movements intact.  ?Cardiovascular:  ?   Rate and Rhythm: Normal rate and regular rhythm.  ?   Heart sounds: Normal heart sounds. No murmur heard. ?  No friction rub. No gallop.  ?Pulmonary:  ?   Effort: Pulmonary effort is normal. No respiratory distress.  ?   Breath sounds: Normal breath sounds. No wheezing, rhonchi or rales.  ?Abdominal:  ?   General: Bowel sounds are normal. There is no distension.  ?   Palpations: Abdomen is  soft.  ?   Tenderness: There is no abdominal tenderness. There is no guarding or rebound.  ?Musculoskeletal:  ?   Cervical back: Neck supple.  ?   Right lower leg: No edema.  ?   Left lower leg: No edema.  ?Skin: ?   General: Skin is warm and dry.  ?   Coloration: Skin is not jaundiced or pale.  ?Neurological:  ?   General: No focal deficit present.  ?   Mental Status: She is alert and oriented to person, place, and time. Mental status is at baseline.  ?Psychiatric:     ?   Mood and Affect: Mood normal.     ?   Behavior: Behavior normal.     ?   Thought Content: Thought content normal.     ?   Judgment: Judgment normal.  ? ? ? ?  Assessment:  ?Ms. LADEAN STEINMEYER is a 71 y.o. female  who presents today for Esophagogastroduodenoscopy and Colonoscopy for GERD, diarrhea. ? ?Plan:  ?Esophagogastroduodenoscopy and Colonoscopy with possible intervention today ? ?Esophagogastroduodenoscopy and Colonoscopy with possible biopsy, control of bleeding, polypectomy, and interventions as necessary has been discussed with the patient/patient representative. Informed consent was obtained from the patient/patient representative after explaining the indication, nature, and risks of the procedure including but not limited to death, bleeding, perforation, missed neoplasm/lesions, cardiorespiratory compromise, and reaction to medications. Opportunity for questions was given and appropriate answers were provided. Patient/patient representative has verbalized understanding is amenable to undergoing the procedure. ? ? ?Annamaria Helling, DO  ?[facility] ? ?Portions of the record may have been created with voice recognition software. Occasional wrong-word or 'sound-a-like' substitutions may have occurred due to the inherent limitations of voice recognition software.  Read the chart carefully and recognize, using context, where substitutions may have occurred. ?

## 2022-02-04 ENCOUNTER — Ambulatory Visit
Admission: RE | Admit: 2022-02-04 | Discharge: 2022-02-04 | Disposition: A | Discharge status: Home / Self Care | Payer: 59 | Attending: Gastroenterology | Admitting: Gastroenterology

## 2022-02-04 ENCOUNTER — Ambulatory Visit: Payer: 59 | Admitting: Anesthesiology

## 2022-02-04 ENCOUNTER — Encounter: Payer: Self-pay | Admitting: Gastroenterology

## 2022-02-04 ENCOUNTER — Encounter
Admission: RE | Disposition: A | Discharge status: Home / Self Care | Payer: Self-pay | Source: Home / Self Care | Attending: Gastroenterology

## 2022-02-04 DIAGNOSIS — K317 Polyp of stomach and duodenum: Secondary | ICD-10-CM | POA: Diagnosis not present

## 2022-02-04 DIAGNOSIS — K621 Rectal polyp: Secondary | ICD-10-CM | POA: Diagnosis not present

## 2022-02-04 DIAGNOSIS — E669 Obesity, unspecified: Secondary | ICD-10-CM | POA: Insufficient documentation

## 2022-02-04 DIAGNOSIS — Z6835 Body mass index (BMI) 35.0-35.9, adult: Secondary | ICD-10-CM | POA: Diagnosis not present

## 2022-02-04 DIAGNOSIS — K449 Diaphragmatic hernia without obstruction or gangrene: Secondary | ICD-10-CM | POA: Insufficient documentation

## 2022-02-04 DIAGNOSIS — D120 Benign neoplasm of cecum: Secondary | ICD-10-CM | POA: Diagnosis not present

## 2022-02-04 DIAGNOSIS — E039 Hypothyroidism, unspecified: Secondary | ICD-10-CM | POA: Diagnosis not present

## 2022-02-04 DIAGNOSIS — D124 Benign neoplasm of descending colon: Secondary | ICD-10-CM | POA: Insufficient documentation

## 2022-02-04 DIAGNOSIS — K219 Gastro-esophageal reflux disease without esophagitis: Secondary | ICD-10-CM | POA: Diagnosis not present

## 2022-02-04 DIAGNOSIS — E785 Hyperlipidemia, unspecified: Secondary | ICD-10-CM | POA: Insufficient documentation

## 2022-02-04 DIAGNOSIS — D123 Benign neoplasm of transverse colon: Secondary | ICD-10-CM | POA: Insufficient documentation

## 2022-02-04 DIAGNOSIS — J45909 Unspecified asthma, uncomplicated: Secondary | ICD-10-CM | POA: Diagnosis not present

## 2022-02-04 DIAGNOSIS — K2950 Unspecified chronic gastritis without bleeding: Secondary | ICD-10-CM | POA: Insufficient documentation

## 2022-02-04 DIAGNOSIS — K5750 Diverticulosis of both small and large intestine without perforation or abscess without bleeding: Secondary | ICD-10-CM | POA: Diagnosis not present

## 2022-02-04 DIAGNOSIS — K529 Noninfective gastroenteritis and colitis, unspecified: Secondary | ICD-10-CM | POA: Insufficient documentation

## 2022-02-04 HISTORY — DX: Obesity, unspecified: E66.9

## 2022-02-04 HISTORY — PX: COLONOSCOPY: SHX5424

## 2022-02-04 HISTORY — PX: ESOPHAGOGASTRODUODENOSCOPY: SHX5428

## 2022-02-04 SURGERY — COLONOSCOPY
Anesthesia: General

## 2022-02-04 MED ORDER — STERILE WATER FOR IRRIGATION IR SOLN: Status: DC | PRN

## 2022-02-04 MED ORDER — LIDOCAINE HCL (CARDIAC) PF 100 MG/5ML IV SOSY
PREFILLED_SYRINGE | INTRAVENOUS | Status: DC | PRN
  Administered 2022-02-04: 100 mg via INTRAVENOUS

## 2022-02-04 MED ORDER — PROPOFOL 10 MG/ML IV BOLUS
INTRAVENOUS | Status: AC
  Filled 2022-02-04: qty 20

## 2022-02-04 MED ORDER — LIDOCAINE HCL (PF) 2 % IJ SOLN
INTRAMUSCULAR | Status: AC
Start: 2022-02-04 — End: ?
  Filled 2022-02-04: qty 5

## 2022-02-04 MED ORDER — PROPOFOL 10 MG/ML IV BOLUS
INTRAVENOUS | Status: DC | PRN
  Administered 2022-02-04: 100 mg via INTRAVENOUS
  Administered 2022-02-04: 120 ug/kg/min via INTRAVENOUS
  Administered 2022-02-04: 100 ug/kg/min via INTRAVENOUS

## 2022-02-04 MED ORDER — PROPOFOL 10 MG/ML IV BOLUS
INTRAVENOUS | Status: AC
Start: 2022-02-04 — End: ?
  Filled 2022-02-04: qty 20

## 2022-02-04 MED ORDER — SODIUM CHLORIDE 0.9 % IV SOLN
INTRAVENOUS | Status: DC
  Administered 2022-02-04: 20 mL/h via INTRAVENOUS

## 2022-02-04 NOTE — Anesthesia Postprocedure Evaluation (Signed)
Anesthesia Post Note ? ?Patient: Karla Turner ? ?Procedure(s) Performed: COLONOSCOPY ?ESOPHAGOGASTRODUODENOSCOPY (EGD) ? ?Patient location during evaluation: Endoscopy ?Anesthesia Type: General ?Level of consciousness: awake and alert ?Pain management: pain level controlled ?Vital Signs Assessment: post-procedure vital signs reviewed and stable ?Respiratory status: spontaneous breathing, nonlabored ventilation and respiratory function stable ?Cardiovascular status: blood pressure returned to baseline and stable ?Postop Assessment: no apparent nausea or vomiting ?Anesthetic complications: no ? ? ?No notable events documented. ? ? ?Last Vitals:  ?Vitals:  ? 02/04/22 1002 02/04/22 1012  ?BP: (!) 95/49 114/60  ?Pulse: 62 65  ?Resp: 16 18  ?Temp:    ?SpO2: 99% 100%  ?  ?Last Pain:  ?Vitals:  ? 02/04/22 1012  ?TempSrc:   ?PainSc: 0-No pain  ? ? ?  ?  ?  ?  ?  ?  ? ?[place] Ouch ? ? ? ? ?

## 2022-02-04 NOTE — Op Note (Signed)
[facility] ?Gastroenterology ?Patient Name: Karla Turner ?Procedure Date: 02/04/2022 8:22 AM ?MRN: [id number] ?Account #: [id number] ?Date of Birth: 1951-05-28 ?Admit Type: Outpatient ?Age: 71 ?Room: [facility] ENDO ROOM 1 ?Gender: Female ?Note Status: Finalized ?Instrument Name: Colonscope [id number] ?Procedure:             Colonoscopy ?Indications:           Chronic diarrhea ?Providers:             Annamaria Helling DO, DO ?Referring MD:          Colan Neptune. Fields (Referring MD) ?Medicines:             Monitored Anesthesia Care ?Complications:         No immediate complications. Estimated blood loss:  ?                       Minimal. ?Procedure:             Pre-Anesthesia Assessment: ?                       - Prior to the procedure, a History and Physical was  ?                       performed, and patient medications and allergies were  ?                       reviewed. The patient is competent. The risks and  ?                       benefits of the procedure and the sedation options and  ?                       risks were discussed with the patient. All questions  ?                       were answered and informed consent was obtained.  ?                       Patient identification and proposed procedure were  ?                       verified by the physician, the nurse, the anesthetist  ?                       and the technician in the endoscopy suite. Mental  ?                       Status Examination: alert and oriented. Airway  ?                       Examination: normal oropharyngeal airway and neck  ?                       mobility. Respiratory Examination: clear to  ?                       auscultation. CV Examination: RRR, no murmurs, no S3  ?  or S4. Prophylactic Antibiotics: The patient does not  ?                       require prophylactic antibiotics. Prior  ?                       Anticoagulants: The patient has taken no previous  ?                        anticoagulant or antiplatelet agents. ASA Grade  ?                       Assessment: II - A patient with mild systemic disease.  ?                       After reviewing the risks and benefits, the patient  ?                       was deemed in satisfactory condition to undergo the  ?                       procedure. The anesthesia plan was to use monitored  ?                       anesthesia care (MAC). Immediately prior to  ?                       administration of medications, the patient was  ?                       re-assessed for adequacy to receive sedatives. The  ?                       heart rate, respiratory rate, oxygen saturations,  ?                       blood pressure, adequacy of pulmonary ventilation, and  ?                       response to care were monitored throughout the  ?                       procedure. The physical status of the patient was  ?                       re-assessed after the procedure. ?                       After obtaining informed consent, the colonoscope was  ?                       passed under direct vision. Throughout the procedure,  ?                       the patient's blood pressure, pulse, and oxygen  ?                       saturations were monitored continuously. The  ?  Colonoscope was introduced through the anus and  ?                       advanced to the the cecum, identified by appendiceal  ?                       orifice and ileocecal valve. The colonoscopy was  ?                       performed without difficulty. The patient tolerated  ?                       the procedure well. The quality of the bowel  ?                       preparation was evaluated using the BBPS [facility] Bowel  ?                       Preparation Scale) with scores of: Right Colon = 3  ?                       (entire mucosa seen well with no residual staining,  ?                       small fragments of stool or opaque liquid), Transverse  ?                        Colon = 3 (entire mucosa seen well with no residual  ?                       staining, small fragments of stool or opaque liquid)  ?                       and Left Colon = 2 (minor amount of residual staining,  ?                       small fragments of stool and/or opaque liquid, but  ?                       mucosa seen well). The total BBPS score equals 8. The  ?                       quality of the bowel preparation was excellent. The  ?                       ileocecal valve, appendiceal orifice, and rectum were  ?                       photographed. ?Findings: ?     The perianal and digital rectal examinations were normal. Pertinent  ?     negatives include normal sphincter tone. ?     Multiple small-mouthed diverticula were found in the entire colon. More  ?     severe in left colon. Estimated blood loss: none. ?     Normal mucosa was found in the entire colon. Biopsies for histology were  ?     taken with a cold forceps from  the right colon and left colon for  ?     evaluation of microscopic colitis. Estimated blood loss was minimal. ?     Three sessile polyps were found in the rectum, transverse colon and  ?     cecum. The polyps were 1 to 2 mm in size. These polyps were removed with  ?     a cold biopsy forceps. Resection and retrieval were complete. Estimated  ?     blood loss was minimal. ?     Two sessile polyps were found in the descending colon. The polyps were 3  ?     to 4 mm in size. These polyps were removed with a cold snare. Resection  ?     and retrieval were complete. Estimated blood loss was minimal. ?     The exam was otherwise without abnormality on direct and retroflexion  ?     views. ?Impression:            - Diverticulosis in the entire examined colon. ?                       - Normal mucosa in the entire examined colon. Biopsied. ?                       - Three 1 to 2 mm polyps in the rectum, in the  ?                       transverse colon and in the cecum, removed with a cold  ?                        biopsy forceps. Resected and retrieved. ?                       - Two 3 to 4 mm polyps in the descending colon,  ?                       removed with a cold snare. Resected and retrieved. ?                       - The examination was otherwise normal on direct and  ?                       retroflexion views. ?Recommendation:        - Discharge patient to home. ?                       - Resume previous diet. ?                       - No aspirin, ibuprofen, naproxen, or other  ?                       non-steroidal anti-inflammatory drugs for 5 days after  ?                       polyp removal. ?                       - Continue present medications. ?                       -  Await pathology results. ?                       - Repeat colonoscopy for surveillance based on  ?                       pathology results. ?                       - Return to GI office as previously scheduled. ?                       - The findings and recommendations were discussed with  ?                       the patient. ?Procedure Code(s):     --- Professional --- ?                       [id number], Colonoscopy, flexible; with removal of  ?                       tumor(s), polyp(s), or other lesion(s) by snare  ?                       technique ?                       45380, 59, Colonoscopy, flexible; with biopsy, single  ?                       or multiple ?Diagnosis Code(s):     --- Professional --- ?                       K62.1, Rectal polyp ?                       K63.5, Polyp of colon ?                       K52.9, Noninfective gastroenteritis and colitis,  ?                       unspecified ?                       K57.30, Diverticulosis of large intestine without  ?                       perforation or abscess without bleeding ?CPT copyright 2019 [employer] Medical Association. All rights reserved. ?The codes documented in this report are preliminary and upon coder review may  ?be revised to meet current compliance  requirements. ?Attending Participation: ?     I personally performed the entire procedure. ?Volney American, DO ?Annamaria Helling DO, DO ?02/04/2022 9:33:58 AM ?This report has been signed electronically. ?Number of Addenda: 0

## 2022-02-04 NOTE — Interval H&P Note (Signed)
History and Physical Interval Note: Preprocedure H&P from 02/04/22 ? was reviewed and there was no interval change after seeing and examining the patient.  Written consent was obtained from the patient after discussion of risks, benefits, and alternatives. Patient has consented to proceed with Esophagogastroduodenoscopy and Colonoscopy with possible intervention ? ? ?02/04/2022 ?8:29 AM ? ?Karla Turner  has presented today for surgery, with the diagnosis of Diarrhea, unspecified type (R19.7) ?Change in bowel habits (R19.4) ?Hx of adenomatous colonic polyps (Z86.010) ?GERD.  The various methods of treatment have been discussed with the patient and family. After consideration of risks, benefits and other options for treatment, the patient has consented to  Procedure(s): ?COLONOSCOPY (N/A) ?ESOPHAGOGASTRODUODENOSCOPY (EGD) (N/A) as a surgical intervention.  The patient's history has been reviewed, patient examined, no change in status, stable for surgery.  I have reviewed the patient's chart and labs.  Questions were answered to the patient's satisfaction.   ? ? ?Annamaria Helling ? ? ?

## 2022-02-04 NOTE — Op Note (Addendum)
[facility] ?Gastroenterology ?Patient Name: Karla Turner ?Procedure Date: 02/04/2022 8:23 AM ?MRN: [id number] ?Account #: [id number] ?Date of Birth: [birth year] ?Admit Type: Outpatient ?Age: 71 ?Room: [facility] ENDO ROOM 1 ?Gender: Female ?Note Status: Finalized ?Instrument Name: Upper Endoscope [id number] ?Procedure:             Upper GI endoscopy ?Indications:           Heartburn, Diarrhea ?Providers:             Annamaria Helling DO, DO ?Referring MD:          Colan Neptune. Fields (Referring MD) ?Medicines:             Monitored Anesthesia Care ?Complications:         No immediate complications. Estimated blood loss:  ?                       Minimal. ?Procedure:             Pre-Anesthesia Assessment: ?                       - Prior to the procedure, a History and Physical was  ?                       performed, and patient medications and allergies were  ?                       reviewed. The patient is competent. The risks and  ?                       benefits of the procedure and the sedation options and  ?                       risks were discussed with the patient. All questions  ?                       were answered and informed consent was obtained.  ?                       Patient identification and proposed procedure were  ?                       verified by the physician, the nurse, the anesthetist  ?                       and the technician in the endoscopy suite. Mental  ?                       Status Examination: alert and oriented. Airway  ?                       Examination: normal oropharyngeal airway and neck  ?                       mobility. Respiratory Examination: clear to  ?                       auscultation. CV Examination: RRR, no murmurs, no S3  ?  or S4. Prophylactic Antibiotics: The patient does not  ?                       require prophylactic antibiotics. Prior  ?                       Anticoagulants: The patient has taken no previous  ?                        anticoagulant or antiplatelet agents. ASA Grade  ?                       Assessment: II - A patient with mild systemic disease.  ?                       After reviewing the risks and benefits, the patient  ?                       was deemed in satisfactory condition to undergo the  ?                       procedure. The anesthesia plan was to use monitored  ?                       anesthesia care (MAC). Immediately prior to  ?                       administration of medications, the patient was  ?                       re-assessed for adequacy to receive sedatives. The  ?                       heart rate, respiratory rate, oxygen saturations,  ?                       blood pressure, adequacy of pulmonary ventilation, and  ?                       response to care were monitored throughout the  ?                       procedure. The physical status of the patient was  ?                       re-assessed after the procedure. ?                       After obtaining informed consent, the endoscope was  ?                       passed under direct vision. Throughout the procedure,  ?                       the patient's blood pressure, pulse, and oxygen  ?                       saturations were monitored continuously. The Endoscope  ?  was introduced through the mouth, and advanced to the  ?                       second part of duodenum. The upper GI endoscopy was  ?                       accomplished without difficulty. The patient tolerated  ?                       the procedure well. ?Findings: ?     The duodenal bulb, first portion of the duodenum and second portion of  ?     the duodenum were normal. Biopsies for histology were taken with a cold  ?     forceps for evaluation of celiac disease. Estimated blood loss was  ?     minimal. ?     A small hiatal hernia was present. Estimated blood loss: none. ?     A single 2 to 3 mm sessile polyp with no bleeding and no stigmata of  ?     recent  bleeding was found on the greater curvature of the stomach.  ?     Biopsies were taken with a cold forceps for histology. Estimated blood  ?     loss was minimal. ?     The entire examined stomach was normal. Biopsies were taken with a cold  ?     forceps for Helicobacter pylori testing. Estimated blood loss was  ?     minimal. ?     The Z-line was regular. Estimated blood loss: none. ?     Esophagogastric landmarks were identified: the gastroesophageal junction  ?     was found at 36 cm from the incisors. ?     The exam of the esophagus was otherwise normal. ?     A small non-bleeding diverticulum was found in the second portion of the  ?     duodenum. Estimated blood loss: none. ?Impression:            - Normal duodenal bulb, first portion of the duodenum  ?                       and second portion of the duodenum. Biopsied. ?                       - Small hiatal hernia. ?                       - A single gastric polyp. Biopsied. ?                       - Normal stomach. Biopsied. ?                       - Z-line regular. ?                       - Esophagogastric landmarks identified. ?                       - Non-bleeding duodenal diverticulum. ?Recommendation:        - Discharge patient to home. ?                       -  Resume previous diet. ?                       - Continue present medications. ?                       - Await pathology results. ?                       - Return to GI clinic as previously scheduled. ?                       - The findings and recommendations were discussed with  ?                       the patient. ?Procedure Code(s):     --- Professional --- ?                       [phone], Esophagogastroduodenoscopy, flexible,  ?                       transoral; with biopsy, single or multiple ?Diagnosis Code(s):     --- Professional --- ?                       K44.9, Diaphragmatic hernia without obstruction or  ?                       gangrene ?                       K31.7, Polyp of stomach and  duodenum ?                       R12, Heartburn ?                       R19.7, Diarrhea, unspecified ?CPT copyright 2019 [employer]. All rights reserved. ?The codes documented in this report are preliminary and upon coder review may  ?be revised to meet current compliance requirements. ?Attending Participation: ?     I personally performed the entire procedure. ?Volney American, DO ?Annamaria Helling DO, DO ?02/04/2022 8:50:46 AM ?This report has been signed electronically. ?Number of Addenda: 0 ?Note Initiated On: 02/04/2022 8:23 AM ?Estimated Blood Loss:  Estimated blood loss was minimal. ?     [facility] ?

## 2022-02-04 NOTE — Anesthesia Preprocedure Evaluation (Addendum)
Anesthesia Evaluation  ?Patient identified by MRN, date of birth, ID band ?Patient awake ? ? ? ?Reviewed: ?Allergy & Precautions, NPO status , Patient's Chart, lab work & pertinent test results ? ?Airway ?Mallampati: I ? ?TM Distance: >3 FB ?Neck ROM: full ? ? ? Dental ?no notable dental hx. ? ?  ?Pulmonary ?shortness of breath, asthma ,  ?  ?Pulmonary exam normal ? ? ? ? ? ? ? Cardiovascular ?negative cardio ROS ?Normal cardiovascular exam ? ? ?  ?Neuro/Psych ?negative neurological ROS ? negative psych ROS  ? GI/Hepatic ?Neg liver ROS, GERD  Medicated,  ?Endo/Other  ?negative endocrine ROS ? Renal/GU ?negative Renal ROS  ?negative genitourinary ?  ?Musculoskeletal ? ? Abdominal ?(+) + obese,   ?Peds ? Hematology ?negative hematology ROS ?(+)   ?Anesthesia Other Findings ?EGD and colonoscopy in March 2019 demonstrating grade a reflux normal duodenum erosive gastritis negative for H. pylori negative for Barrett's esophagus ? ?Past Medical History: ?No date: Arthritis ?No date: Asthma ?No date: Bronchitis ?No date: Cataracts, bilateral ?No date: Colon polyps ?No date: Depression ?No date: Diverticulitis ?No date: GERD (gastroesophageal reflux disease) ?No date: Glaucoma ?No date: Hyperlipidemia ?No date: Hypothyroidism ?No date: Lumbar herniated disc ?No date: Obesity (BMI 35.0-39.9 without comorbidity) ?No date: Osteoporosis ? ?Past Surgical History: ?2019: BIOPSY BREAST ?No date: BREAST SURGERY ?No date: cataract surgery; Bilateral ?01/2021: CHOLECYSTECTOMY ?No date: COLONOSCOPY ?01/29/2018: COLONOSCOPY WITH PROPOFOL; N/A ?    Comment:  Procedure: COLONOSCOPY WITH PROPOFOL;  Surgeon: Vira Agar, ?             Gavin Pound, MD;  Location: [facility] ENDOSCOPY;  Service:  ?             Endoscopy;  Laterality: N/A; ?No date: DILATION AND CURETTAGE OF UTERUS ?01/29/2018: ESOPHAGOGASTRODUODENOSCOPY (EGD) WITH PROPOFOL; N/A ?    Comment:  Procedure: ESOPHAGOGASTRODUODENOSCOPY (EGD) WITH  ?              PROPOFOL;  Surgeon: Manya Silvas, MD;  Location:  ?             [facility];  Service: Endoscopy;  Laterality: N/A; ?No date: EYE SURGERY ?No date: mamoplasty; Bilateral ? ? ? ? Reproductive/Obstetrics ?negative OB ROS ? ?  ? ? ? ? ? ? ? ? ? ? ? ? ? ?  ?  ? ? ? ? ? ? ? ?Anesthesia Physical ?Anesthesia Plan ? ?ASA: 2 ? ?Anesthesia Plan: General  ? ?Post-op Pain Management:   ? ?Induction:  ? ?PONV Risk Score and Plan: Propofol infusion and TIVA ? ?Airway Management Planned:  ? ?Additional Equipment:  ? ?Intra-op Plan:  ? ?Post-operative Plan:  ? ?Informed Consent: I have reviewed the patients History and Physical, chart, labs and discussed the procedure including the risks, benefits and alternatives for the proposed anesthesia with the patient or authorized representative who has indicated his/her understanding and acceptance.  ? ? ? ?Dental advisory given ? ?Plan Discussed with: Anesthesiologist, CRNA and Surgeon ? ?Anesthesia Plan Comments:   ? ? ? ? ? ?Anesthesia Quick Evaluation ? ?

## 2022-02-04 NOTE — Transfer of Care (Signed)
Immediate Anesthesia Transfer of Care Note ? ?Patient: Karla Turner ? ?Procedure(s) Performed: COLONOSCOPY ?ESOPHAGOGASTRODUODENOSCOPY (EGD) ? ?Patient Location: PACU ? ?Anesthesia Type:General ? ?Level of Consciousness: awake ? ?Airway & Oxygen Therapy: Patient Spontanous Breathing ? ?Post-op Assessment: Report given to RN ? ?Post vital signs: Reviewed and stable ? ?Last Vitals:  ?Vitals Value Taken Time  ?BP 149/98 02/04/22 0933  ?Temp 35.7 ?C 02/04/22 0932  ?Pulse 79 02/04/22 0936  ?Resp 17 02/04/22 0936  ?SpO2 99 % 02/04/22 0936  ?Vitals shown include unvalidated device data. ? ?Last Pain:  ?Vitals:  ? 02/04/22 0932  ?TempSrc: Temporal  ?PainSc: 0-No pain  ?   ? ?  ? ?Complications: No notable events documented. ?

## 2022-02-07 ENCOUNTER — Encounter: Payer: Self-pay | Admitting: Gastroenterology

## 2022-02-07 LAB — SURGICAL PATHOLOGY

## 2022-09-30 ENCOUNTER — Other Ambulatory Visit: Payer: Self-pay | Admitting: Pulmonary Disease

## 2022-09-30 DIAGNOSIS — J4531 Mild persistent asthma with (acute) exacerbation: Secondary | ICD-10-CM

## 2023-03-29 MED ORDER — SODIUM CHLORIDE 0.9 % IV SOLN: INTRAVENOUS | Status: DC

## 2023-03-30 ENCOUNTER — Encounter (HOSPITAL_COMMUNITY): Payer: Self-pay | Admitting: Anesthesiology

## 2023-03-30 DIAGNOSIS — I340 Nonrheumatic mitral (valve) insufficiency: Secondary | ICD-10-CM

## 2023-04-13 ENCOUNTER — Encounter: Admission: RE | Payer: Self-pay | Source: Home / Self Care

## 2023-04-13 ENCOUNTER — Ambulatory Visit: Admission: RE | Admit: 2023-04-13 | Payer: 59 | Source: Home / Self Care | Admitting: Cardiovascular Disease

## 2023-04-13 DIAGNOSIS — I340 Nonrheumatic mitral (valve) insufficiency: Secondary | ICD-10-CM

## 2023-04-13 SURGERY — ECHOCARDIOGRAM, TRANSESOPHAGEAL
Anesthesia: General

## 2023-06-20 ENCOUNTER — Ambulatory Visit
Admission: RE | Admit: 2023-06-20 | Discharge: 2023-06-20 | Disposition: A | Discharge status: Home / Self Care | Payer: No Typology Code available for payment source | Attending: Internal Medicine | Admitting: Internal Medicine

## 2023-06-20 ENCOUNTER — Encounter: Payer: Self-pay | Admitting: Internal Medicine

## 2023-06-20 ENCOUNTER — Encounter
Admission: RE | Disposition: A | Discharge status: Home / Self Care | Payer: Self-pay | Source: Home / Self Care | Attending: Internal Medicine

## 2023-06-20 ENCOUNTER — Ambulatory Visit: Payer: No Typology Code available for payment source | Admitting: Anesthesiology

## 2023-06-20 ENCOUNTER — Other Ambulatory Visit: Payer: Self-pay

## 2023-06-20 ENCOUNTER — Ambulatory Visit (HOSPITAL_BASED_OUTPATIENT_CLINIC_OR_DEPARTMENT_OTHER)
Admission: RE | Admit: 2023-06-20 | Discharge: 2023-06-20 | Disposition: A | Discharge status: Home / Self Care | Payer: No Typology Code available for payment source | Source: Home / Self Care | Attending: Internal Medicine | Admitting: Internal Medicine

## 2023-06-20 DIAGNOSIS — Z8701 Personal history of pneumonia (recurrent): Secondary | ICD-10-CM

## 2023-06-20 DIAGNOSIS — Z6834 Body mass index (BMI) 34.0-34.9, adult: Secondary | ICD-10-CM

## 2023-06-20 DIAGNOSIS — A084 Viral intestinal infection, unspecified: Secondary | ICD-10-CM

## 2023-06-20 DIAGNOSIS — E785 Hyperlipidemia, unspecified: Secondary | ICD-10-CM | POA: Insufficient documentation

## 2023-06-20 DIAGNOSIS — R7301 Impaired fasting glucose: Secondary | ICD-10-CM

## 2023-06-20 DIAGNOSIS — T8579XA Infection and inflammatory reaction due to other internal prosthetic devices, implants and grafts, initial encounter: Secondary | ICD-10-CM

## 2023-06-20 DIAGNOSIS — H40001 Preglaucoma, unspecified, right eye: Secondary | ICD-10-CM

## 2023-06-20 DIAGNOSIS — R350 Frequency of micturition: Secondary | ICD-10-CM

## 2023-06-20 DIAGNOSIS — F419 Anxiety disorder, unspecified: Secondary | ICD-10-CM

## 2023-06-20 DIAGNOSIS — E038 Other specified hypothyroidism: Secondary | ICD-10-CM

## 2023-06-20 DIAGNOSIS — Z6837 Body mass index (BMI) 37.0-37.9, adult: Secondary | ICD-10-CM | POA: Insufficient documentation

## 2023-06-20 DIAGNOSIS — E669 Obesity, unspecified: Secondary | ICD-10-CM | POA: Insufficient documentation

## 2023-06-20 DIAGNOSIS — R0602 Shortness of breath: Secondary | ICD-10-CM | POA: Insufficient documentation

## 2023-06-20 DIAGNOSIS — R768 Other specified abnormal immunological findings in serum: Secondary | ICD-10-CM

## 2023-06-20 DIAGNOSIS — N3090 Cystitis, unspecified without hematuria: Secondary | ICD-10-CM

## 2023-06-20 DIAGNOSIS — Z23 Encounter for immunization: Secondary | ICD-10-CM

## 2023-06-20 DIAGNOSIS — Z0000 Encounter for general adult medical examination without abnormal findings: Secondary | ICD-10-CM

## 2023-06-20 DIAGNOSIS — E039 Hypothyroidism, unspecified: Secondary | ICD-10-CM

## 2023-06-20 DIAGNOSIS — H401131 Primary open-angle glaucoma, bilateral, mild stage: Secondary | ICD-10-CM

## 2023-06-20 DIAGNOSIS — Z9049 Acquired absence of other specified parts of digestive tract: Secondary | ICD-10-CM

## 2023-06-20 DIAGNOSIS — Z86010 Personal history of colonic polyps: Secondary | ICD-10-CM

## 2023-06-20 DIAGNOSIS — Z961 Presence of intraocular lens: Secondary | ICD-10-CM

## 2023-06-20 DIAGNOSIS — M816 Localized osteoporosis [Lequesne]: Secondary | ICD-10-CM

## 2023-06-20 DIAGNOSIS — K8020 Calculus of gallbladder without cholecystitis without obstruction: Secondary | ICD-10-CM

## 2023-06-20 DIAGNOSIS — E538 Deficiency of other specified B group vitamins: Secondary | ICD-10-CM

## 2023-06-20 DIAGNOSIS — Z8719 Personal history of other diseases of the digestive system: Secondary | ICD-10-CM

## 2023-06-20 DIAGNOSIS — E7849 Other hyperlipidemia: Secondary | ICD-10-CM

## 2023-06-20 DIAGNOSIS — F320 Major depressive disorder, single episode, mild: Secondary | ICD-10-CM

## 2023-06-20 DIAGNOSIS — R1900 Intra-abdominal and pelvic swelling, mass and lump, unspecified site: Secondary | ICD-10-CM

## 2023-06-20 DIAGNOSIS — R7989 Other specified abnormal findings of blood chemistry: Secondary | ICD-10-CM

## 2023-06-20 DIAGNOSIS — E7800 Pure hypercholesterolemia, unspecified: Secondary | ICD-10-CM

## 2023-06-20 DIAGNOSIS — K2960 Other gastritis without bleeding: Secondary | ICD-10-CM

## 2023-06-20 DIAGNOSIS — I340 Nonrheumatic mitral (valve) insufficiency: Secondary | ICD-10-CM | POA: Insufficient documentation

## 2023-06-20 HISTORY — PX: TEE WITHOUT CARDIOVERSION: SHX5443

## 2023-06-20 SURGERY — ECHOCARDIOGRAM, TRANSESOPHAGEAL
Anesthesia: General

## 2023-06-20 MED ORDER — ACETAMINOPHEN 325 MG PO TABS: 650.0000 mg | ORAL_TABLET | Freq: Once | ORAL | Status: DC | PRN

## 2023-06-20 MED ORDER — SODIUM CHLORIDE 0.9 % IV SOLN: INTRAVENOUS | Status: DC

## 2023-06-20 MED ORDER — ONDANSETRON HCL 4 MG/2ML IJ SOLN: 4.0000 mg | Freq: Once | INTRAMUSCULAR | Status: DC | PRN

## 2023-06-20 MED ORDER — LIDOCAINE HCL (CARDIAC) PF 50 MG/5ML IV SOSY
PREFILLED_SYRINGE | INTRAVENOUS | Status: DC | PRN
Start: 2023-06-20 — End: 2023-06-20
  Administered 2023-06-20: 50 mg via INTRAVENOUS

## 2023-06-20 MED ORDER — PROPOFOL 10 MG/ML IV BOLUS
INTRAVENOUS | Status: DC | PRN
  Administered 2023-06-20: 50 mg via INTRAVENOUS
  Administered 2023-06-20: 40 mg via INTRAVENOUS

## 2023-06-20 MED ORDER — ACETAMINOPHEN 160 MG/5ML PO SOLN: 325.0000 mg | ORAL | Status: DC | PRN

## 2023-06-20 MED ORDER — DEXMEDETOMIDINE HCL IN NACL 80 MCG/20ML IV SOLN
INTRAVENOUS | Status: DC | PRN
  Administered 2023-06-20: 16 ug via INTRAVENOUS

## 2023-06-20 NOTE — H&P (Signed)
PRIMARY CARE PROVIDER:  Areta Haber, NP [address] [place] [address] Karla Turner, Karla Turner 06/18/2023 DOB: 04-Jul-1951 Age: 72 y.o. [facility] Karla Turner   Chief Complaint  Patient presents with  Shortness of Breath   History of Present Illness  Karla Turner is a 72 y.o. female here for evaluation of SOB. Previously saw Dr Emogene Morgan at [facility]. Last visit was in 09/2018. Echo at that showed mild MR, mild LAE, normal LV fxn.   This is her first visit here. No known h/o CAD, MI, HF, arrhythmia, CVA. States she recalls being told she had a heart murmur dating back to her 72s. She states that she has been feeling SOB and tired for several years.  She is here today for a follow-up visit. She was supposed to have a TEE to assess her mitral regurgitation however something happened with scheduling and she was never able to get the TEE. Patient is still complaining of shortness of breath and she would like to proceed with TEE evaluation of her mitral regurgitation. Patient denies chest pain, palpitations, diaphoresis, syncope, edema, PND, orthopnea.  Current Medications and Allergies   Allergies  Allergen Reactions  White Petroleum Jelly [White Petrolatum] Unknown  Pt states that she used Invisalign (which she states is a petroleum product and afterward developed systemic "poisoning." Other reaction(s): Other (See Comments) Pt states that she used Invisalign (which she states is a petroleum product and afterward developed systemic "poisoning."  Other reaction(s): Other (See Comments) Pt states that she used Invisalign (which she states is a petroleum product and afterward developed systemic "poisoning."    Current Outpatient Medications  Medication Sig Dispense Refill  albuterol-budesonide 90-80 mcg/actuation HFAA Inhale 2 Puffs into the lungs every 6 (six) hours as needed 10.7 g 5  brinzolamide (AZOPT) 1 % ophthalmic suspension USE ONE DROP IN RIGHT EYE 3 TIMES DAILY 30 mL 4   cholecalciferol, vitamin D3, (VITAMIN D3) 125 mcg (5,000 unit) tablet Take 5,000 Units by mouth once daily  fluticasone furoate-vilanteroL (BREO ELLIPTA) 100-25 mcg/dose DsDv inhaler INHALE ONE DOSE BY MOUTH DAILY  levothyroxine (TIROSINT) 88 mcg Cap Take 1 capsule by mouth once daily Six days a week 26 capsule 11  methazolAMIDE (NEPTAZANE) 25 MG tablet Take 25 mg by mouth 2 (two) times daily   Current Facility-Administered Medications  Medication Dose Route Frequency Provider Last Rate Last Admin  cyanocobalamin (VITAMIN B12) injection 1,000 mcg 1,000 mcg Intramuscular Q30 Days Areta Haber, NP 1,000 mcg at 02/24/23 1359   Additional Past Medical, Social, and Family History:  ADDITIONAL PROBLEM LIST: Problem List Date Reviewed: 03/23/2023   ICD-10-CM Priority Class Noted - Resolved Diagnosed  Anxiety F41.9 11/24/2021 - Present  Overview Signed 03/01/2022 10:14 AM by Laren Boom, CMA  Last Assessment & Plan:  Formatting of this note might be different from the original. Will try atarax PRN.  Consider Wellbutrin but with history of glaucoma, will want to clear with opthalmology at [facility]. referral placed to [facility].   B12 deficiency E53.8 11/24/2021 - Present  Diarrhea R19.7 11/17/2021 - Present  Change in bowel habits R19.4 11/17/2021 - Present  Depression, major, single episode, mild (CMS-HCC) F32.0 10/11/2021 - Present  Overview Signed 03/01/2022 10:14 AM by Laren Boom, CMA  Last Assessment & Plan:  Formatting of this note might be different from the original. Uncontrolled. Situational as it relates to a strained relationship with grandson. Referral for counseling. Patient politely declines medication at this time. We can consider at follow-up.  Insect bite of lower leg S80.869A, W57.Lorne Skeens 07/15/2021 - Present  Overview Signed 03/01/2022 10:14 AM by Laren Boom, CMA  Last Assessment & Plan:  Formatting of this note might be different from the original. I am  not sure that these bites are due to ticks. Maybe ants (see photos pt sent). Recommended that she add claritin for itching and continue to apply prn triamcinolone ointment.   Urinary frequency R35.0 07/15/2021 - Present  Overview Signed 03/01/2022 10:14 AM by Laren Boom, CMA  Last Assessment & Plan:  Formatting of this note might be different from the original. Pt will give a urine sample at her home clinic for culture.   Urinary tract infection without hematuria N39.0 01/22/2021 - Present  Cystitis N30.90 01/20/2021 - Present  History of cholecystectomy Z90.49 01/20/2021 - Present  History of pneumonia Z87.01 01/20/2021 - Present  Low TSH level R79.89 01/20/2021 - Present  Pure hypercholesterolemia E78.00 01/20/2021 - Present  Symptomatic cholelithiasis K80.20 12/25/2020 - Present  Nausea and vomiting R11.2 12/24/2020 - Present  Viral gastroenteritis A08.4 12/24/2020 - Present  Low back pain without sciatica M54.50 12/07/2020 - Present  Need for shingles vaccine Z23 12/07/2020 - Present  Hypothyroidism due to Hashimoto's thyroiditis E03.8, E06.3 09/02/2020 - Present  Overview Signed 03/01/2022 10:14 AM by Laren Boom, CMA  Formatting of this note might be different from the original. Hypothyroidism due to Hashimoto's thyroiditis: Patient with history of hypothyroidism for over 25 years. Her most recent TSH has been low 0.138. She is taking levothyroxine the Tirosint brand at 100 mcg daily  Last Assessment & Plan:  Formatting of this note might be different from the original. Check TSH on levothyroxine the Tirosint brand at 100 mcg daily   IFG (impaired fasting glucose) R73.01 09/02/2020 - Present  Overview Signed 03/01/2022 10:14 AM by Laren Boom, CMA  Last Assessment & Plan:  Formatting of this note might be different from the original. Impaired fasting glucose on 04/08/20 lab. Pt has been taking Metformin without any weight loss or improvement. See "class 2 obesity" tab. Pt is  contraindicated to phentermine, topiramate, and wellbutrin due to glaucoma. Discussed and decided to trial low dose Ozempic. Instruction given; side effect profile discussed. Prescription sent. PA needed. Pt verbalized understanding and agreed to plan. Will follow.   Medication: START Ozempic 0.25 mg weekly injections. CONTINUE Metformin 500 bid with meals Formatting of this note might be different from the original. Last Assessment & Plan:  Formatting of this note might be different from the original. Impaired fasting glucose on 04/08/20 lab. Pt has been taking Metformin without any weight loss or improvement. See "class 2 obesity" tab. Pt is contraindicated to phentermine, topiramate, and wellbutrin due to glaucoma. Discussed and decided to trial low dose Ozempic. Instruction given; side effect profile discussed. Prescription sent. PA needed. Pt verbalized understanding and agreed to plan. Will follow.   Medication: START Ozempic 0.25 mg weekly injections. CONTINUE Metformin 500 bid with meals   Localized osteoporosis without current pathological fracture M81.6 09/02/2020 - Present  Overview Signed 03/01/2022 10:14 AM by Laren Boom, CMA  Last Assessment & Plan:  Formatting of this note might be different from the original. Bone density: 07/03/20 osteoporosis - Declines fosamax or medication . Continue with diet rich in calcium ca and Vit D and weight bearing exercise.   Routine physical examination Z00.00 09/02/2020 - Present  Overview Signed 03/01/2022 10:14 AM by Laren Boom, CMA  Last Assessment & Plan:  Formatting of this note might be  different from the original. Patient declines clinical breast exam. Encouraged her to start performing monthly self breast exams. Mammogram and bone density have been ordered.Patient will call to schedule these. She politely declines pelvic exam in the absence of symptoms and she is no longer screening for cervical cancer. Screening labs ordered. Encouraged  to remain active.   Osteopenia M85.80 06/09/2020 - Present  Other hyperlipidemia E78.49 06/02/2020 - Present  Overview Signed 03/01/2022 10:14 AM by Laren Boom, CMA  Formatting of this note might be different from the original. Last Assessment & Plan:  Formatting of this note is different from the original. Pt's last panel was elevated (04/08/20). Pt is not taking any medications. Pt will follow with PCP in August 2021 to discuss elevated lipids.   Medication:  - Cholesterol treatment: NA - Statin intolerance: no.  Lab Results  Component Value Date  LDL Calculated 136 (H) 04/08/2020  HDL 57 04/08/2020  Triglycerides 148 04/08/2020   The 10-year ASCVD risk score Denman George DC Jr., et al., 2013) is: 6.2%  Last Assessment & Plan:  Formatting of this note is different from the original. Pt's last panel was elevated (04/08/20). Pt is not taking any medications. Pt will follow with PCP in August 2021 to discuss elevated lipids.   Lab Results  Component Value Date  LDL Calculated 136 (H) 04/08/2020  HDL 57 04/08/2020  Triglycerides 148 04/08/2020   The 10-year ASCVD risk score Denman George DC Jr., et al., 2013) is: 5.4% Last Assessment & Plan:  Formatting of this note is different from the original. Pt's last panel was elevated (04/08/20). Pt is not taking any medications. Pt will follow with PCP in August 2021 to discuss elevated lipids.   Lab Results  Component Value Date  LDL Calculated 136 (H) 04/08/2020  HDL 57 04/08/2020  Triglycerides 148 04/08/2020   The 10-year ASCVD risk score Denman George DC Jr., et al., 2013) is: 5.4%   Cyst of ovary N83.209 02/10/2020 - Present  Acquired hypothyroidism E03.9 01/20/2020 - Present  Overview Signed 03/01/2022 10:14 AM by Laren Boom, CMA  Formatting of this note might be different from the original. Last Assessment & Plan:  Formatting of this note might be different from the original. Pt is followed closely with [facility]. Last week tsh was wnl.  We will draw some other labs as below. Last Assessment & Plan:  Formatting of this note might be different from the original. Pt is followed closely with [facility]. Last week tsh was wnl. We will draw some other labs as below.   Uterine fibroid D25.9 01/20/2020 - Present  Bronchiectasis without complication (CMS/HHS-HCC) J47.9 03/15/2019 - Present  Overview Signed 03/01/2022 10:14 AM by Laren Boom, CMA  Formatting of this note might be different from the original. Last Assessment & Plan:  Formatting of this note might be different from the original. - Stable; No exacerbations - CT chest consistent with smooth cylindrical bronchiectasis rather than a cystic or mucopurulent variant   Dyslipidemia E78.5 11/16/2018 - Present  History of diverticulitis Z87.19 11/16/2018 - Present  Positive ANA (antinuclear antibody) R76.8 09/17/2018 - Present  Primary open angle glaucoma (POAG) of both eyes, mild stage H40.1131 09/17/2018 - Present  Overview Signed 03/01/2022 10:14 AM by Laren Boom, CMA  Last Assessment & Plan:  Formatting of this note might be different from the original. Followed at [facility] opthalmology.   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, H20.9, H40.40X0 04/15/2014 - Present  Colon polyp K63.5 Unknown - Present  RESOLVED: Chronic obstructive pulmonary disease (COPD) (CMS/HHS-HCC) J44.9 12/18/2017 - 03/01/2022  RESOLVED: Obesity E66.9 11/17/2017 - 11/17/2017    ADDITIONAL MEDICAL HISTORY: Past Medical History:  Diagnosis Date  Asthma (HHS-HCC)  Broken ankle  Cataracts, bilateral  Colon polyp  Depression  Erosive gastritis 06/18/2018  GERD (gastroesophageal reflux disease) 11/17/2017  Hemorrhoids  Herniated disc  Hx of adenomatous colonic polyps 11/17/2017  Hypothyroidism due to Hashimoto's thyroiditis  Increased intraocular pressure 11/2013  Obesity (BMI 35.0-39.9 without comorbidity), unspecified 11/17/2017   Social History: See HPI above. Additionally: Social History   Socioeconomic History  Marital status: Married  Tobacco Use  Smoking status: Never  Smokeless tobacco: Never  Vaping Use  Vaping status: Never Used  Substance and Sexual Activity  Alcohol use: Yes  Alcohol/week: 10.0 standard drinks of alcohol  Types: 10 Glasses of wine per week  Drug use: No  Sexual activity: Yes  Partners: Male  Birth control/protection: Post-menopausal   Social Determinants of Health   Financial Resource Strain: Low Risk (01/09/2023)  Overall Financial Resource Strain (CARDIA)  Difficulty of Paying Living Expenses: Not hard at all  Food Insecurity: No Food Insecurity (01/09/2023)  Hunger Vital Sign  Worried About Running Out of Food in the Last Year: Never true  Ran Out of Food in the Last Year: Never true  Transportation Needs: No Transportation Needs (01/09/2023)  PRAPARE - Risk analyst (Medical): No  Lack of Transportation (Non-Medical): No  Physical Activity: Inactive (11/02/2020)  Received [facility]  Exercise Vital Sign  Days of Exercise per Week: 0 days  Minutes of Exercise per Session: 0 min  Stress: No Stress Concern Present (04/07/2020)  Received from [facility], [facility] - Occupational Stress Questionnaire  Feeling of Stress : Only a little  Housing Stability: Unknown (01/09/2023)  Housing Stability Vital Sign  Unable to  Pay for Housing in the Last Year: No  Unstable Housing in the Last Year: No   Family History: See HPI above. Additionally: Family History  Problem Relation Name Age of Onset  Breast cancer Sister Vickie  Fibromyalgia Sister Vickie  Thyroid disease Sister Vickie  High blood pressure (Hypertension) Mother  No Known Problems Son Ethelene Browns  Stomach cancer Paternal Grandmother  Blindness Neg Hx  Glaucoma Neg Hx  Macular degeneration Neg Hx   Review of Systems: See HPI above. Additionally: Constitutional: no fevers or chills Eyes: no diplopia ENT: no epistaxis Cardiovascular: See HPI above Respiratory: See HPI above Gastrointestinal: no BRBPR Genitourinary: no hematuria Musculoskeletal: no joint swelling Skin: no rash Neurological: no strokelike sx Psychiatric: no SI Endocrine: no night sweats Hematologic/Lymphatic: no bleeding Allergic/Immunologic: See allergies above  Physical Exam   Vitals:  06/13/23 1018  BP: 124/86  Pulse: 72  Resp: 14   Body mass index is 37.91 kg/m.  Wt Readings from Last 3 Encounters:  03/23/23 97.1 kg (214 lb)  01/09/23 96.8 kg (213 lb 8 oz)  12/29/22 95.3 kg (210 lb)   BP Readings  from Last 3 Encounters:  06/13/23 124/86  03/23/23 128/82  01/09/23 124/80  Pulse Readings from Last 3 Encounters:  06/13/23 72  03/23/23 70  01/09/23 71    Gen: NAD Neck: No JVD CV: nl s1s2, RRR, soft systolic murmur, though distant heart sounds 2/2 body habitus Resp: CTAB, nl effort GI: abd soft, NT Skin: warm Edema: no LE edema MSK: no obvious deformity Psych: nl affect Neuro: awake and alert Endo: no thyromegaly Lymph: no cervical LAD  Data/Results   Recent Labs  01/06/23 1007  CHOLTOTAL 172  HDL 54.5  LDLCALC 90  VLDL 27  TRIG 136   Recent Labs  01/06/23 1007  NA 142  K 4.4  BUN 10  CREATININE 0.8  CO2 29.8  GLUCOSE 88  ALT 19  AST 20  TBILI 0.6  ALB 4.3   Recent Labs  07/05/22 0957 01/06/23 1007  WBC 5.6 5.4  HGB  14.5 14.2  HCT 44.8 43.8  MCV 92.9 94.4  PLT 194 201   Recent Labs  07/07/22 0839 12/22/22 1349 01/06/23 1007  TSH 1.848 1.447 1.869  HGBA1C -- -- 5.3    ECG: I personally interpreted. NSR, slight leftward axis, LVH  TTE 02/22/23, reviewed by me: NORMAL LEFT VENTRICULAR SYSTOLIC FUNCTION NORMAL RIGHT VENTRICULAR SYSTOLIC FUNCTION MODERATE VALVULAR REGURGITATION (MR) NO VALVULAR STENOSIS Mild LAE also noted  TTE performed at [facility] 09/2018  Normal left ventricular systolic function, ejection fraction > 55%  Degenerative mitral valve disease  Mitral regurgitation - mild  Dilated left atrium - mild  Normal right ventricular systolic function  Technically difficult study due to chest wall/lung interference  Cardiovascular Problem List  -SOB/DOE -MR, moderate, ?2/2 MVP -mild LAE -heart murmur -obesity -ECG with NSR, LVH, slight leftward axis  Assessment and Plan  -Echo images demonstrate at least moderate MR, preserved LV function, normal LVIDd/s, though with some mild LAE. No pulmonary hypertension. Unclear mechanism of MR, though MVP is a possibility. Will set her up for TEE to better characterize/quantify MR.  -may end up pursuing additional testing, eg ETT and LHC/RHC, depending on these results -f/u 4-6 weeks, after TEE  Attestation Statement:   I personally performed the service. (TP)  Allexis Bordenave, DO

## 2023-06-20 NOTE — Anesthesia Preprocedure Evaluation (Addendum)
Anesthesia Evaluation  Patient identified by MRN, date of birth, ID band Patient awake    Reviewed: Allergy & Precautions, NPO status , Patient's Chart, lab work & pertinent test results  History of Anesthesia Complications Negative for: history of anesthetic complications  Airway Mallampati: IV   Neck ROM: Full    Dental no notable dental hx.    Pulmonary asthma    Pulmonary exam normal breath sounds clear to auscultation       Cardiovascular Normal cardiovascular exam+ Valvular Problems/Murmurs (moderate MR)  Rhythm:Regular Rate:Normal  TTE 02/22/23: NORMAL LEFT VENTRICULAR SYSTOLIC FUNCTION NORMAL RIGHT VENTRICULAR SYSTOLIC FUNCTION MODERATE VALVULAR REGURGITATION (MR) NO VALVULAR STENOSIS Mild LAE also noted  [facility]E [facility] at [facility] 09/2018  Normal left ventricular systolic function, ejection fraction > 55%  Degenerative mitral valve disease  Mitral regurgitation - mild  Dilated left atrium - mild  Normal right ventricular systolic function  Technically difficult study due to chest wall/lung interference     Neuro/Psych  PSYCHIATRIC DISORDERS  Depression    negative neurological ROS     GI/Hepatic ,GERD  ,,  Endo/Other  Obesity   Renal/GU negative Renal ROS     Musculoskeletal  (+) Arthritis ,    Abdominal   Peds  Hematology negative hematology ROS (+)   Anesthesia Other Findings Cardiology note 03/23/23:  Assessment and Plan  -I have reviewed echo images, which demonstrate at least moderate MR, preserved LV function, normal LVIDd/s, though with some mild LAE. No pulmonary hypertension. Unclear mechanism of MR, though MVP is a possibility. Will set her up for TEE to better characterize/quantify MR.  -may end up pursuing additional testing, eg ETT and LHC/RHC, depending on these results -f/u 4-6 weeks, after TEE    Reproductive/Obstetrics                              Anesthesia Physical Anesthesia Plan  ASA: 3  Anesthesia Plan: General   Post-op Pain Management:    Induction: Intravenous  PONV Risk Score and Plan: 3 and Propofol infusion, TIVA and Treatment may vary due to age or medical condition  Airway Management Planned: Natural Airway  Additional Equipment:   Intra-op Plan:   Post-operative Plan:   Informed Consent: I have reviewed the patients History and Physical, chart, labs and discussed the procedure including the risks, benefits and alternatives for the proposed anesthesia with the patient or authorized representative who has indicated his/her understanding and acceptance.       Plan Discussed with: CRNA  Anesthesia Plan Comments: (LMA/GETA backup discussed.  Patient consented for risks of anesthesia including but not limited to:  - adverse reactions to medications - damage to eyes, teeth, lips or other oral mucosa - nerve damage due to positioning  - sore throat or hoarseness - damage to heart, brain, nerves, lungs, other parts of body or loss of life  Informed patient about role of CRNA in peri- and intra-operative care.  Patient voiced understanding.)        Anesthesia Quick Evaluation

## 2023-06-20 NOTE — CV Procedure (Signed)
TEE: Under moderate sedation, TEE was performed without complications: LV: Normal. Normal EF. RV: Normal LA: Normal. Left atrial appendage: Normal without thrombus. Normal function.  RA: Normal  MV: Normal Moderate MR. TV: Normal Trace TR AV: Normal. No AI or AS. PV: Normal. Trace PI.  Thoracic and ascending aorta: Normal without significant plaque or atheromatous changes.  Patient stable for discharge 1 hour post procedure.    Karla Searing Launi Asencio, DO 06/20/23 12:32 PM

## 2023-06-20 NOTE — Anesthesia Postprocedure Evaluation (Signed)
Anesthesia Post Note  Patient: Karla Turner  Procedure(s) Performed: TRANSESOPHAGEAL ECHOCARDIOGRAM (TEE)  Patient location during evaluation: PACU Anesthesia Type: General Level of consciousness: awake and alert, oriented and patient cooperative Pain management: pain level controlled Vital Signs Assessment: post-procedure vital signs reviewed and stable Respiratory status: spontaneous breathing, nonlabored ventilation and respiratory function stable Cardiovascular status: blood pressure returned to baseline and stable Postop Assessment: adequate PO intake Anesthetic complications: no   No notable events documented.   Last Vitals:  Vitals:   06/20/23 1240 06/20/23 1245  BP: (!) 100/54 (!) 95/55  Pulse: 63 63  Resp: 20 14  Temp:    SpO2: 97% 96%    Last Pain:  Vitals:   06/20/23 1245  TempSrc:   PainSc: 0-No pain                 Reed Breech

## 2023-06-20 NOTE — Transfer of Care (Signed)
Immediate Anesthesia Transfer of Care Note  Patient: Karla Turner  Procedure(s) Performed: TRANSESOPHAGEAL ECHOCARDIOGRAM (TEE)  Patient Location: PACU  Anesthesia Type:General  Level of Consciousness: oriented, patient cooperative, and responds to stimulation  Airway & Oxygen Therapy: Patient Spontanous Breathing and Patient connected to nasal cannula oxygen  Post-op Assessment: Report given to RN and Post -op Vital signs reviewed and stable  Post vital signs: Reviewed and stable  Last Vitals:  Vitals Value Taken Time  BP 87/48 06/20/23 1225  Temp    Pulse 72 06/20/23 1225  Resp 16 06/20/23 1225  SpO2 97% 06/20/23 1225    Last Pain:  Vitals:   06/20/23 1114  TempSrc: Oral  PainSc: 0-No pain         Complications: No notable events documented.

## 2023-06-20 NOTE — Progress Notes (Signed)
*  PRELIMINARY RESULTS* Echocardiogram Echocardiogram Transesophageal has been performed.  Cristela Blue 06/20/2023, 12:31 PM

## 2023-06-28 ENCOUNTER — Encounter: Payer: Self-pay | Admitting: Internal Medicine

## 2023-10-03 ENCOUNTER — Other Ambulatory Visit: Payer: Self-pay | Admitting: Family Medicine

## 2023-10-03 DIAGNOSIS — R1011 Right upper quadrant pain: Secondary | ICD-10-CM

## 2023-10-04 ENCOUNTER — Ambulatory Visit
Admission: RE | Admit: 2023-10-04 | Discharge: 2023-10-04 | Disposition: A | Discharge status: Home / Self Care | Payer: No Typology Code available for payment source | Source: Ambulatory Visit | Attending: Family Medicine | Admitting: Family Medicine

## 2023-10-04 DIAGNOSIS — R1011 Right upper quadrant pain: Secondary | ICD-10-CM | POA: Insufficient documentation

## 2023-11-04 ENCOUNTER — Other Ambulatory Visit: Payer: Self-pay | Admitting: Pulmonary Disease

## 2023-11-04 DIAGNOSIS — J4531 Mild persistent asthma with (acute) exacerbation: Secondary | ICD-10-CM

## 2023-11-08 ENCOUNTER — Ambulatory Visit (INDEPENDENT_AMBULATORY_CARE_PROVIDER_SITE_OTHER): Payer: No Typology Code available for payment source

## 2023-11-08 ENCOUNTER — Ambulatory Visit
Admission: EM | Admit: 2023-11-08 | Discharge: 2023-11-08 | Disposition: A | Discharge status: Home / Self Care | Payer: No Typology Code available for payment source

## 2023-11-08 DIAGNOSIS — R0602 Shortness of breath: Secondary | ICD-10-CM

## 2023-11-08 DIAGNOSIS — R051 Acute cough: Secondary | ICD-10-CM

## 2023-11-08 DIAGNOSIS — J069 Acute upper respiratory infection, unspecified: Secondary | ICD-10-CM | POA: Diagnosis not present

## 2023-11-08 DIAGNOSIS — J45901 Unspecified asthma with (acute) exacerbation: Secondary | ICD-10-CM

## 2023-11-08 LAB — POC COVID19/FLU A&B COMBO
Covid Antigen, POC: NEGATIVE
Influenza A Antigen, POC: NEGATIVE
Influenza B Antigen, POC: NEGATIVE

## 2023-11-08 MED ORDER — PREDNISONE 20 MG PO TABS
40.0000 mg | ORAL_TABLET | Freq: Once | ORAL | Status: AC
  Administered 2023-11-08: 40 mg via ORAL

## 2023-11-08 MED ORDER — ALBUTEROL SULFATE (2.5 MG/3ML) 0.083% IN NEBU: 2.5000 mg | INHALATION_SOLUTION | Freq: Once | RESPIRATORY_TRACT | Status: DC

## 2023-11-08 MED ORDER — PREDNISONE 10 MG PO TABS
40.0000 mg | ORAL_TABLET | Freq: Every day | ORAL | 0 refills | Status: AC
Start: 2023-11-09 — End: 2023-11-13

## 2023-11-08 MED ORDER — ALBUTEROL SULFATE HFA 108 (90 BASE) MCG/ACT IN AERS
2.0000 | INHALATION_SPRAY | Freq: Once | RESPIRATORY_TRACT | Status: AC
  Administered 2023-11-08: 2 via RESPIRATORY_TRACT

## 2023-11-08 NOTE — Discharge Instructions (Addendum)
 Take the prednisone as directed.  Use the albuterol inhaler as directed.  Continue taking the Zithromax as directed.    Follow up with your primary care provider tomorrow.  Go to the emergency department if you have worsening symptoms.

## 2023-11-08 NOTE — ED Provider Notes (Signed)
 CAY RALPH PELT    CSN: [id number] Arrival date & time: 11/08/23  1755      History   Chief Complaint Chief Complaint  Patient presents with   Fever    HPI Karla Turner is a 73 y.o. female.  Patient presents with fever, congestion, cough, shortness of breath x 4-5 days.  She reports fever of 102.8 this afternoon and took Tylenol .  She is currently on Zithromax  which was prescribed by her PCP on 11/07/2023; she took her second dose today.  She reports doing an albuterol  nebulizer treatment at home this afternoon.  Her medical history includes asthma.  The history is provided by the patient and medical records.    Past Medical History:  Diagnosis Date   Arthritis    Asthma    Bronchitis    Cataracts, bilateral    Colon polyps    Diverticulitis    GERD (gastroesophageal reflux disease)    Glaucoma    Hyperlipidemia    Hypothyroidism    Lumbar herniated disc    Obesity (BMI 35.0-39.9 without comorbidity)    Osteoporosis     Patient Active Problem List   Diagnosis Date Noted   Anxiety 11/24/2021   B12 deficiency 11/24/2021   Depression, major, single episode, mild (HCC) 10/11/2021   Urinary frequency 07/15/2021   Insect bite of lower leg 07/15/2021   Urinary tract infection without hematuria 01/22/2021   History of pneumonia 01/20/2021   History of cholecystectomy 01/20/2021   Cystitis 01/20/2021   Low TSH level 01/20/2021   Pure hypercholesterolemia 01/20/2021   Symptomatic cholelithiasis 12/25/2020   Diarrhea 12/24/2020   Viral gastroenteritis 12/24/2020   Nausea and vomiting 12/24/2020   Need for shingles vaccine 12/07/2020   Low back pain without sciatica 12/07/2020   Routine physical examination 09/02/2020   Localized osteoporosis without current pathological fracture 09/02/2020   Hypothyroidism due to Hashimoto's thyroiditis 09/02/2020   IFG (impaired fasting glucose) 09/02/2020   Other hyperlipidemia 06/02/2020   Class 2 obesity 04/05/2020    Cyst of ovary 02/10/2020   Acquired hypothyroidism 01/20/2020   Glaucoma 01/20/2020   Uterine fibroid 01/20/2020   History of diverticulitis 11/16/2018   Dyslipidemia 11/16/2018   Primary open angle glaucoma (POAG) of both eyes, mild stage 09/17/2018   Positive ANA (antinuclear antibody) 09/17/2018   Erosive gastritis 06/18/2018   Breast mass, right 05/30/2018   Shortness of breath 12/18/2017   Vitamin B12 deficiency anemia 12/18/2017   Asthma 11/17/2017   GERD (gastroesophageal reflux disease) 11/17/2017   Hx of adenomatous colonic polyps 11/17/2017   Body mass index (BMI) of 34.0-34.9 in adult 11/17/2017   Pelvic mass in female 01/10/2017   Varicose veins 07/24/2015   Glaucoma suspect of right eye 04/15/2014   Infection and inflammatory reaction due to other internal prosthetic devices, implants and grafts, initial encounter (HCC) 04/15/2014   Pseudophakia of both eyes 04/15/2014   Cystoid macular edema 03/18/2013   Epiretinal membrane 03/18/2013    Past Surgical History:  Procedure Laterality Date   BIOPSY BREAST  2019   BREAST SURGERY     cataract surgery Bilateral    CHOLECYSTECTOMY  01/2021   COLONOSCOPY     COLONOSCOPY N/A 02/04/2022   Procedure: COLONOSCOPY;  Surgeon: Onita Elspeth Sharper, DO;  Location: [facility] ENDOSCOPY;  Service: Gastroenterology;  Laterality: N/A;   COLONOSCOPY WITH PROPOFOL  N/A 01/29/2018   Procedure: COLONOSCOPY WITH PROPOFOL ;  Surgeon: Viktoria Lamar DASEN, MD;  Location: [facility] ENDOSCOPY;  Service: Endoscopy;  Laterality: N/A;  DILATION AND CURETTAGE OF UTERUS     ESOPHAGOGASTRODUODENOSCOPY N/A 02/04/2022   Procedure: ESOPHAGOGASTRODUODENOSCOPY (EGD);  Surgeon: Onita Elspeth Sharper, DO;  Location: [facility] ENDOSCOPY;  Service: Gastroenterology;  Laterality: N/A;   ESOPHAGOGASTRODUODENOSCOPY (EGD) WITH PROPOFOL  N/A 01/29/2018   Procedure: ESOPHAGOGASTRODUODENOSCOPY (EGD) WITH PROPOFOL ;  Surgeon: Viktoria Lamar DASEN, MD;  Location: [facility] ENDOSCOPY;  Service:  Endoscopy;  Laterality: N/A;   EYE SURGERY     mamoplasty Bilateral    TEE WITHOUT CARDIOVERSION N/A 06/20/2023   Procedure: TRANSESOPHAGEAL ECHOCARDIOGRAM (TEE);  Surgeon: Dewane Shiner, DO;  Location: [facility] ORS;  Service: Cardiovascular;  Laterality: N/A;    OB History   No obstetric history on file.      Home Medications    Prior to Admission medications   Medication Sig Start Date End Date Taking? Authorizing Provider  azithromycin  (ZITHROMAX ) 250 MG tablet Take 2 tablets (500mg ) by mouth on Day 1. Take 1 tablet (250mg ) by mouth on Days 2-5. 11/07/23 11/12/23 Yes [provider]  predniSONE  (DELTASONE ) 10 MG tablet Take 4 tablets (40 mg total) by mouth daily for 4 days. 11/09/23 11/13/23 Yes Corlis Burnard DEL, NP  albuterol  (VENTOLIN  HFA) 108 (90 Base) MCG/ACT inhaler Inhale 2 puffs into the lungs every 6 (six) hours as needed for wheezing or shortness of breath. 12/07/21   Hunsucker, Donnice SAUNDERS, MD  brinzolamide (AZOPT) 1 % ophthalmic suspension Place 1 drop into the right eye 3 (three) times daily.    [provider]  budesonide  (PULMICORT ) 0.25 MG/2ML nebulizer solution TAKE 1 VIAL BY NEBULIZATION TWO TIMES A DAY AS NEEDED 07/16/19   Guse, Tinnie HERO, FNP  fluticasone  furoate-vilanterol (BREO ELLIPTA ) 100-25 MCG/ACT AEPB INHALE 1 DOSE BY MOUTH DAILY 11/04/23   Hunsucker, Donnice SAUNDERS, MD  hydrOXYzine  (ATARAX ) 10 MG tablet Take 1 tablet (10 mg total) by mouth at bedtime as needed for anxiety (may cause drowsiness.). Patient not taking: Reported on 12/07/2021 11/24/21   Flinchum, Rosaline RAMAN, FNP  methazolamide (NEPTAZANE) 25 MG tablet Take 25 mg by mouth 2 (two) times daily.    [provider]  montelukast  (SINGULAIR ) 10 MG tablet Take 10 mg by mouth at bedtime. Patient not taking: Reported on 06/20/2023    [provider]  omeprazole  (PRILOSEC ) 20 MG capsule Take 1 capsule (20 mg total) by mouth daily. Patient not taking: Reported on 06/20/2023 10/08/19   Maribeth Camellia MATSU, MD  TIROSINT  88 MCG CAPS TAKE 1 CAPSULE BY MOUTH ONCE DAILY 04/29/21   Flinchum, Rosaline RAMAN, FNP    Family History Family History  Problem Relation Age of Onset   Breast cancer Sister    Fibromyalgia Sister    Allergies Son    Asthma Son        as a child    Cancer Maternal Grandmother    Heart attack Maternal Grandfather     Social History Social History   Tobacco Use   Smoking status: Never   Smokeless tobacco: Never  Vaping Use   Vaping status: Never Used  Substance Use Topics   Alcohol use: Yes    Alcohol/week: 10.0 standard drinks of alcohol    Types: 10 Glasses of wine per week    Comment: occasionally   Drug use: No     Allergies   White petrolatum   Review of Systems Review of Systems  Constitutional:  Positive for fever. Negative for chills.  HENT:  Positive for congestion. Negative for ear pain and sore throat.   Respiratory:  Positive for  cough and shortness of breath.      Physical Exam Triage Vital Signs ED Triage Vitals  Encounter Vitals Group     BP 11/08/23 1907 116/80     Systolic BP Percentile --      Diastolic BP Percentile --      Pulse Rate 11/08/23 1901 (!) 101     Resp 11/08/23 1901 18     Temp 11/08/23 1907 99.2 F (37.3 C)     Temp src --      SpO2 11/08/23 1901 95 %     Weight --      Height --      Head Circumference --      Peak Flow --      Pain Score 11/08/23 1846 1     Pain Loc --      Pain Education --      Exclude from Growth Chart --    No data found.  Updated Vital Signs BP 116/80   Pulse (!) 101   Temp 99.2 F (37.3 C)   Resp 18   SpO2 95%   Visual Acuity Right Eye Distance:   Left Eye Distance:   Bilateral Distance:    Right Eye Near:   Left Eye Near:    Bilateral Near:     Physical Exam Constitutional:      General: She is not in acute distress. HENT:     Right Ear: Tympanic membrane normal.     Left Ear: Tympanic membrane normal.     Nose: Nose normal.     Mouth/Throat:      Mouth: Mucous membranes are moist.     Pharynx: Oropharynx is clear.  Cardiovascular:     Rate and Rhythm: Normal rate and regular rhythm.     Heart sounds: Normal heart sounds.  Pulmonary:     Effort: Pulmonary effort is normal. No respiratory distress.     Breath sounds: Decreased air movement present. No wheezing.  Neurological:     Mental Status: She is alert.      UC Treatments / Results  Labs (all labs ordered are listed, but only abnormal results are displayed) Labs Reviewed  POC COVID19/FLU A&B COMBO    EKG   Radiology DG Chest 2 View Result Date: 11/08/2023 CLINICAL DATA:  Cough and shortness of breath.  Fever. EXAM: CHEST - 2 VIEW COMPARISON:  06/21/2017 FINDINGS: Stable heart size and mediastinal contours. Chronic bronchial thickening. No focal airspace disease. No pulmonary edema, pleural effusion, or pneumothorax. No acute osseous findings. IMPRESSION: No active cardiopulmonary disease. Electronically Signed   By: Andrea Gasman M.D.   On: 11/08/2023 20:08    Procedures Procedures (including critical care time)  Medications Ordered in UC Medications  albuterol  (VENTOLIN  HFA) 108 (90 Base) MCG/ACT inhaler 2 puff (2 puffs Inhalation Given 11/08/23 1943)  predniSONE  (DELTASONE ) tablet 40 mg (40 mg Oral Given 11/08/23 1948)    Initial Impression / Assessment and Plan / UC Course  I have reviewed the triage vital signs and the nursing notes.  Pertinent labs & imaging results that were available during my care of the patient were reviewed by me and considered in my medical decision making (see chart for details).    Cough, shortness of breath, acute URI, asthma exacerbation. CXR negative.   Patient took her second dose of Zithromax  today.  Albuterol  inhaler given here.  First dose of prednisone  given here as pharmacies are closed.  4 additional days of prednisone  sent to  her pharmacy.  Rapid flu and COVID-negative.  Instructed patient to follow-up with her PCP  tomorrow.  ED precautions given.  Education provided on asthma and shortness of breath.  She agrees to plan of care  Final Clinical Impressions(s) / UC Diagnoses   Final diagnoses:  Acute cough  SOB (shortness of breath)  Acute upper respiratory infection  Asthma with acute exacerbation, unspecified asthma severity, unspecified whether persistent     Discharge Instructions      Take the prednisone  as directed.  Use the albuterol  inhaler as directed.  Continue taking the Zithromax  as directed.    Follow up with your primary care provider tomorrow.  Go to the emergency department if you have worsening symptoms.        ED Prescriptions     Medication Sig Dispense Auth. Provider   predniSONE  (DELTASONE ) 10 MG tablet Take 4 tablets (40 mg total) by mouth daily for 4 days. 16 tablet Corlis Burnard DEL, NP      PDMP not reviewed this encounter.   Corlis Burnard DEL, NP 11/09/23 [phone]

## 2023-11-08 NOTE — ED Triage Notes (Addendum)
 Patient to Urgent Care with complaints of fevers (reports 102.8 pta), productive cough with green and thick mucus, soreness in her ribs d/t cough, nasal congestion.  Symptoms started 4 days ago.  Meds: tylenol @5pm . Taking azithromycin from her pcp.

## 2023-12-01 ENCOUNTER — Other Ambulatory Visit: Payer: Self-pay | Admitting: Pulmonary Disease

## 2023-12-01 DIAGNOSIS — J4531 Mild persistent asthma with (acute) exacerbation: Secondary | ICD-10-CM

## 2023-12-14 ENCOUNTER — Telehealth: Payer: Self-pay | Admitting: Pulmonary Disease

## 2023-12-14 DIAGNOSIS — J4531 Mild persistent asthma with (acute) exacerbation: Secondary | ICD-10-CM

## 2023-12-14 MED ORDER — FLUTICASONE FUROATE-VILANTEROL 100-25 MCG/ACT IN AEPB
1.0000 | INHALATION_SPRAY | Freq: Every day | RESPIRATORY_TRACT | 0 refills | Status: DC
Start: 2023-12-14 — End: 2024-02-05

## 2023-12-14 NOTE — Telephone Encounter (Signed)
 Patient states needs refill for Breo. Patient scheduled 02/05/2024 with Dr. Marygrace Snellen. Pharmacy is [facility] [address] [address] [place]. Patient phone number is [phone].

## 2023-12-14 NOTE — Telephone Encounter (Signed)
 Breo sent to preferred pharmacy. Pt is aware of need to keep scheduled visit for further refills.  Nothing further needed.

## 2024-02-05 ENCOUNTER — Ambulatory Visit: Payer: No Typology Code available for payment source | Admitting: Pulmonary Disease

## 2024-02-05 ENCOUNTER — Encounter: Payer: Self-pay | Admitting: Pulmonary Disease

## 2024-02-05 DIAGNOSIS — Z7722 Contact with and (suspected) exposure to environmental tobacco smoke (acute) (chronic): Secondary | ICD-10-CM

## 2024-02-05 DIAGNOSIS — J4520 Mild intermittent asthma, uncomplicated: Secondary | ICD-10-CM

## 2024-02-05 DIAGNOSIS — J4531 Mild persistent asthma with (acute) exacerbation: Secondary | ICD-10-CM

## 2024-02-05 DIAGNOSIS — J479 Bronchiectasis, uncomplicated: Secondary | ICD-10-CM | POA: Diagnosis not present

## 2024-02-05 DIAGNOSIS — J45909 Unspecified asthma, uncomplicated: Secondary | ICD-10-CM | POA: Diagnosis not present

## 2024-02-05 DIAGNOSIS — J471 Bronchiectasis with (acute) exacerbation: Secondary | ICD-10-CM

## 2024-02-05 MED ORDER — BUDESONIDE 0.25 MG/2ML IN SUSP: 0.2500 mg | Freq: Two times a day (BID) | RESPIRATORY_TRACT | 4 refills | Status: AC | PRN

## 2024-02-05 MED ORDER — FLUTICASONE FUROATE-VILANTEROL 100-25 MCG/ACT IN AEPB
1.0000 | INHALATION_SPRAY | Freq: Every day | RESPIRATORY_TRACT | 11 refills | Status: AC
Start: 2024-02-05 — End: ?

## 2024-02-05 MED ORDER — ALBUTEROL SULFATE HFA 108 (90 BASE) MCG/ACT IN AERS: 2.0000 | INHALATION_SPRAY | Freq: Four times a day (QID) | RESPIRATORY_TRACT | 11 refills | Status: AC | PRN

## 2024-02-05 NOTE — Progress Notes (Signed)
 Patient ID: Karla Turner, female    DOB: 02-06-1951, 73 y.o.   MRN: [id number]  Chief Complaint  Patient presents with   Follow-up    Pt states refills    Referring provider: Alm Bustard, NP  HPI 73 y.o. female, never smoked (exposed to second hand smoke). PMH significant for asthma, bronchiectasis, COPD, obesity, glaucoma, positive ANA, dyslipidemia, GERD. Patient of Dr. Kendrick Fries, seen for initial consult on 01/08/19. History of pneumonia as child. Alpha-1 antitrypsin and immunodeficency labs were normal. FENO normal. PFTs showed evidence of moderate restriction and air trapping consistent with asthma. Maintained on Breo 100 and prn albuterol.   Doing well breathing is fine. Rare albuterol use.  Unfortunately became ill 11/2023.  Exacerbation.  First in some time.  Got some antibiotics.  Helped some but did not get prednisone till later.  Prednisone helped more.  Gradually improved.  Back to baseline.    Chest imaging: November 2019 CT chest images independently reviewed showing no pulmonary parenchymal abnormality with the exception of increased airway caliber specifically in the lower lobes bilaterally but no mucus plugging, no pulmonary parenchymal infiltrate, no masses. CT chest consistent with smooth cylindrical bronchiectasis rather than a cystic or mucopurulent variant.   FENO: 01/2019 11 ppb  Allergies  Allergen Reactions   White Petrolatum     Other reaction(s): Other (See Comments) Pt states that she used Invisalign (which she states is a petroleum product and afterward developed systemic "poisoning."    Immunization History  Administered Date(s) Administered   Fluad Quad(high Dose 65+) 08/27/2019, 08/17/2020, 07/16/2021   Influenza, High Dose Seasonal PF 09/07/2018   Influenza-Unspecified 09/05/2018, 08/17/2020   PFIZER(Purple Top)SARS-COV-2 Vaccination 12/15/2019, 01/08/2020, 08/24/2020   Pneumococcal Conjugate-13 09/07/2018   Pneumococcal Polysaccharide-23  01/20/2020   Pneumococcal-Unspecified 09/09/2014   Tdap 06/09/2020   Zoster Recombinant(Shingrix) 09/02/2020, 12/07/2020    Past Medical History:  Diagnosis Date   Arthritis    Asthma    Bronchitis    Cataracts, bilateral    Colon polyps    Diverticulitis    GERD (gastroesophageal reflux disease)    Glaucoma    Hyperlipidemia    Hypothyroidism    Lumbar herniated disc    Obesity (BMI 35.0-39.9 without comorbidity)    Osteoporosis     Tobacco History: Social History   Tobacco Use  Smoking Status Never  Smokeless Tobacco Never   Counseling given: Not Answered   Outpatient Medications Prior to Visit  Medication Sig Dispense Refill   brinzolamide (AZOPT) 1 % ophthalmic suspension Place 1 drop into the right eye 3 (three) times daily.     methazolamide (NEPTAZANE) 25 MG tablet Take 25 mg by mouth 2 (two) times daily.     TIROSINT 88 MCG CAPS TAKE 1 CAPSULE BY MOUTH ONCE DAILY 90 capsule 0   albuterol (VENTOLIN HFA) 108 (90 Base) MCG/ACT inhaler Inhale 2 puffs into the lungs every 6 (six) hours as needed for wheezing or shortness of breath. 1 each 11   budesonide (PULMICORT) 0.25 MG/2ML nebulizer solution TAKE 1 VIAL BY NEBULIZATION TWO TIMES A DAY AS NEEDED 60 mL 4   fluticasone furoate-vilanterol (BREO ELLIPTA) 100-25 MCG/ACT AEPB Inhale 1 puff into the lungs daily. 60 each 0   hydrOXYzine (ATARAX) 10 MG tablet Take 1 tablet (10 mg total) by mouth at bedtime as needed for anxiety (may cause drowsiness.). (Patient not taking: Reported on 02/05/2024) 30 tablet 0   montelukast (SINGULAIR) 10 MG tablet Take 10 mg by mouth at bedtime. (  Patient not taking: Reported on 02/05/2024)     omeprazole (PRILOSEC) 20 MG capsule Take 1 capsule (20 mg total) by mouth daily. (Patient not taking: Reported on 02/05/2024) 90 capsule 1   No facility-administered medications prior to visit.    Review of Systems n/a  Physical Exam  BP 120/68 (BP Location: Left Arm, Patient Position: Sitting,  Cuff Size: Normal)   Pulse 94   Ht 5\' 4"  (1.626 m)   Wt 220 lb (99.8 kg)   SpO2 94%   BMI 37.76 kg/m  General: well appearing, in NAD Respiratory: CTAB, no wheeze, good air movement Cardiovascular: RRR, no murmurs Abd: ND,  BS present   Lab Results: Reviewed no elevated Eos CBC    Component Value Date/Time   WBC 5.7 11/24/2021 1158   RBC 4.71 11/24/2021 1158   HGB 14.1 11/24/2021 1158   HGB 13.0 01/22/2021 1029   HCT 43.1 11/24/2021 1158   HCT 40.2 01/22/2021 1029   PLT 204.0 11/24/2021 1158   PLT 220 01/22/2021 1029   MCV 91.4 11/24/2021 1158   MCV 90 01/22/2021 1029   MCV 88 02/21/2013 2153   MCH 29.0 01/22/2021 1029   MCH 31.1 07/20/2017 0827   MCHC 32.6 11/24/2021 1158   RDW 13.4 11/24/2021 1158   RDW 12.6 01/22/2021 1029   RDW 12.4 02/21/2013 2153   LYMPHSABS 1.4 11/24/2021 1158   LYMPHSABS 1.6 01/22/2021 1029   MONOABS 0.5 11/24/2021 1158   EOSABS 0.1 11/24/2021 1158   EOSABS 0.4 01/22/2021 1029   BASOSABS 0.0 11/24/2021 1158   BASOSABS 0.1 01/22/2021 1029    BMET    Component Value Date/Time   NA 140 11/24/2021 1158   NA 140 01/22/2021 1029   NA 139 02/21/2013 2153   K 4.3 11/24/2021 1158   K 3.7 02/21/2013 2153   CL 105 11/24/2021 1158   CL 108 (H) 02/21/2013 2153   CO2 28 11/24/2021 1158   CO2 27 02/21/2013 2153   GLUCOSE 98 11/24/2021 1158   GLUCOSE 135 (H) 02/21/2013 2153   BUN 14 11/24/2021 1158   BUN 12 01/22/2021 1029   BUN 12 02/21/2013 2153   CREATININE 0.79 11/24/2021 1158   CREATININE 0.67 02/21/2013 2153   CALCIUM 9.2 11/24/2021 1158   CALCIUM 8.5 02/21/2013 2153   GFRNONAA 75 08/24/2018 1111   GFRNONAA >60 02/21/2013 2153   GFRAA 87 08/24/2018 1111   GFRAA >60 02/21/2013 2153    BNP No results found for: "BNP"  ProBNP No results found for: "PROBNP"  Imaging: CT chest 09/2018: Mild basilar predominant bronchiectasis on my interperation    Assessment & Plan:   Asthma: Well controlled in general on Breo.  Provided  refill of albuterol inhaler. Has PRN ICS nebs as well, refilled.   Bronchiectasis: mild. No sputum. CTM.    Karren Burly, MD 02/05/2024

## 2024-02-05 NOTE — Patient Instructions (Signed)
 Nice to see you again  I refilled the Breo  I refilled albuterol even though you do not use it very often, 1 to make sure you have an active prescription in case you need it  I refilled the budesonide nebulized medicine to have on hand as well  Return to clinic in 6 months or sooner as needed with Dr. Judeth Horn
